# Patient Record
Sex: Female | Born: 1940
Health system: Southern US, Community
[De-identification: ages and names within clinical notes are randomized; demographics above are authoritative.]

## PROBLEM LIST (undated history)

## (undated) DIAGNOSIS — I10 Essential (primary) hypertension: Secondary | ICD-10-CM

## (undated) DIAGNOSIS — K5790 Diverticulosis of intestine, part unspecified, without perforation or abscess without bleeding: Secondary | ICD-10-CM

## (undated) DIAGNOSIS — E039 Hypothyroidism, unspecified: Secondary | ICD-10-CM

## (undated) DIAGNOSIS — E119 Type 2 diabetes mellitus without complications: Secondary | ICD-10-CM

## (undated) DIAGNOSIS — Z8669 Personal history of other diseases of the nervous system and sense organs: Secondary | ICD-10-CM

## (undated) DIAGNOSIS — E7800 Pure hypercholesterolemia, unspecified: Secondary | ICD-10-CM

## (undated) DIAGNOSIS — K449 Diaphragmatic hernia without obstruction or gangrene: Secondary | ICD-10-CM

## (undated) DIAGNOSIS — K219 Gastro-esophageal reflux disease without esophagitis: Secondary | ICD-10-CM

## (undated) DIAGNOSIS — R739 Hyperglycemia, unspecified: Secondary | ICD-10-CM

## (undated) HISTORY — DX: Diaphragmatic hernia without obstruction or gangrene: K44.9

## (undated) HISTORY — DX: Essential (primary) hypertension: I10

## (undated) HISTORY — DX: Hyperglycemia, unspecified: R73.9

## (undated) HISTORY — DX: Pure hypercholesterolemia, unspecified: E78.00

## (undated) HISTORY — DX: Hypothyroidism, unspecified: E03.9

## (undated) HISTORY — DX: Gastro-esophageal reflux disease without esophagitis: K21.9

## (undated) HISTORY — DX: Diverticulosis of intestine, part unspecified, without perforation or abscess without bleeding: K57.90

## (undated) HISTORY — DX: Personal history of other diseases of the nervous system and sense organs: Z86.69

---

## 1990-01-20 HISTORY — PX: THYROID LOBECTOMY: SHX420

## 1998-01-20 HISTORY — PX: BREAST EXCISIONAL BIOPSY: SUR124

## 2004-12-02 ENCOUNTER — Emergency Department: Payer: Self-pay | Admitting: Emergency Medicine

## 2005-10-09 ENCOUNTER — Ambulatory Visit: Payer: Self-pay | Admitting: Internal Medicine

## 2005-11-17 ENCOUNTER — Ambulatory Visit: Payer: Self-pay | Admitting: Gastroenterology

## 2006-05-20 ENCOUNTER — Ambulatory Visit: Payer: Self-pay | Admitting: Internal Medicine

## 2006-08-10 ENCOUNTER — Emergency Department: Payer: Self-pay | Admitting: Emergency Medicine

## 2006-11-11 ENCOUNTER — Ambulatory Visit: Payer: Self-pay | Admitting: Urology

## 2007-05-25 ENCOUNTER — Ambulatory Visit: Payer: Self-pay | Admitting: Internal Medicine

## 2008-05-09 ENCOUNTER — Ambulatory Visit: Payer: Self-pay | Admitting: Cardiology

## 2008-05-29 ENCOUNTER — Ambulatory Visit: Payer: Self-pay | Admitting: Internal Medicine

## 2008-05-31 ENCOUNTER — Ambulatory Visit: Payer: Self-pay | Admitting: Cardiology

## 2008-05-31 ENCOUNTER — Ambulatory Visit: Payer: Self-pay | Admitting: Gastroenterology

## 2008-06-09 ENCOUNTER — Telehealth: Payer: Self-pay | Admitting: Cardiology

## 2008-08-18 ENCOUNTER — Telehealth: Payer: Self-pay | Admitting: Cardiology

## 2008-11-16 DIAGNOSIS — R0602 Shortness of breath: Secondary | ICD-10-CM

## 2009-06-21 ENCOUNTER — Ambulatory Visit: Payer: Self-pay | Admitting: Internal Medicine

## 2010-02-04 ENCOUNTER — Ambulatory Visit: Payer: Self-pay | Admitting: Internal Medicine

## 2010-02-20 ENCOUNTER — Ambulatory Visit: Payer: Self-pay | Admitting: Internal Medicine

## 2010-03-21 ENCOUNTER — Ambulatory Visit: Payer: Self-pay | Admitting: Internal Medicine

## 2010-06-04 NOTE — Assessment & Plan Note (Signed)
[facility] OFFICE NOTE   NAME:Veronica Branch, Veronica Branch                    MRN:          [id number]  DATE:05/09/2008                            DOB:          26-Nov-1940    CHIEF COMPLAINT:  Shortness of breath.   Veronica Branch is a 70 year old married white female, wife of a patient  of mine, who comes today referred by Dr. Dale [place] for increasing  shortness of breath and dyspnea on exertion.   She says that it has got particularly bad over the past year.  She rides  a stationary bike and enjoys walking.  She has really been limited.   She thinks its just her weight and deconditioning.  She has gained 15  pounds over the past year.   She has cardiac risk factors including age, family history as well as  hyperlipidemia.  She is on a statin.  She is also overweight.   Her symptoms do not include any symptoms of angina per se.  She has had  no palpitations.  No syncope.   PAST MEDICAL/SURGICAL HISTORY:  She has had removal of right thyroid  lobe and isthmus in 1992.  She has a history of a hiatal hernia with  reflux.  She has migraine headaches, on propranolol; hypothyroidism, on  replacement therapy; and a history of lactose intolerance.   CURRENT MEDICATIONS:  1. Tylenol p.r.n.  2. Synthroid 100 mcg per day.  3. Propranolol 40 mg per day.  4. Omeprazole 20 mg per day.  5. Pravastatin 20 mg a day.   ALLERGIES:  1. PENICILLIN gives her hives.  2. SULFA gives her rash.  3. CIPRO causes her to have arm pain and tendinitis.   FAMILY HISTORY:  Her mother had a heart attack in her 16s.  She has one  brother, who is living in good health.   SOCIAL HISTORY:  She is married, has 2 children.  She does not drink or  smoke.  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   REVIEW OF SYSTEMS:  Other than the history of present illness, she  really is negative for all systems questioned.  Please refer to our  patient  information form.   PHYSICAL EXAMINATION:  GENERAL:  She is a very pleasant, overweight,  white female in no acute distress.  VITAL SIGNS:  Her blood pressure is 140/84, pulse 64 and regular, her  weight is 192.  She is 5 feet tall.  HEENT:  Normal.  Teeth are in good shape.  Oral mucosa normal.  NECK:  Supple.  Carotid upstrokes were equal bilaterally without bruits.  Thyroid is not enlarged.  Trachea is midline.  LUNGS:  Clear to auscultation and percussion.  HEART:  A poorly appreciated PMI.  Normal S1 and S2.  No murmur, rub, or  gallop.  ABDOMEN:  Obese with good bowel sounds.  There is no tenderness.  There  is no obvious pulsatile mass or midline bruit.  No obvious organomegaly.  EXTREMITIES:  No cyanosis, clubbing, or edema.  Pulses were 2+/4+  dorsalis pedis and 1+/4+ posterior tibial.  There is no sign of DVT.  She has no major varicosities.  NEUROLOGIC:  Intact.  MUSCULOSKELETAL:  Chronic arthritic changes.  Her gait is normal.   Electrocardiogram from Dr. Roby Lofts office shows sinus rhythm with some T-  wave changes across the anterior precordium with some poor R-wave  progression.   ASSESSMENT:  Slow increase in dyspnea on exertion and shortness of  breath.  This could be an ischemic equivalent and obstructive coronary  artery disease needs to be ruled out with her risk factors.  She also  has some mild abnormality on her EKG.   PLAN:  Exercise rest stress Myoview off of propranolol.  We will assess  the anterior wall, EF, not to mention ischemia.   If this is negative, we will attribute this to deconditioning and to her  weight.   I have encouraged her to exercise at least 3 hours per week.  I have  also told her that she will not lose weight by exercising unless she  would exercise 10 hours per week and not change her diet!   She is a delightful lady and hopefully her scan will be negative.  Hopefully she can discipline herself to get her 15 pounds back off and   maintain her weight at a lower level.     Thomas C. Daleen Squibb, MD, [facility]  Electronically Signed    TCW/MedQ  DD: 05/09/2008  DT: 05/10/2008  Job #: [id number]   cc:   Dale [place]

## 2010-07-04 ENCOUNTER — Encounter: Payer: Self-pay | Admitting: Cardiology

## 2010-08-01 ENCOUNTER — Ambulatory Visit: Payer: Self-pay | Admitting: Internal Medicine

## 2011-08-05 ENCOUNTER — Ambulatory Visit: Payer: Self-pay | Admitting: Internal Medicine

## 2011-08-07 ENCOUNTER — Ambulatory Visit: Payer: Self-pay | Admitting: Internal Medicine

## 2011-11-25 ENCOUNTER — Encounter: Payer: Self-pay | Admitting: Internal Medicine

## 2011-11-25 ENCOUNTER — Ambulatory Visit (INDEPENDENT_AMBULATORY_CARE_PROVIDER_SITE_OTHER): Payer: Medicare Other | Admitting: Internal Medicine

## 2011-11-25 VITALS — BP 128/82 | HR 60 | Temp 97.4°F | Ht 64.0 in | Wt 195.0 lb

## 2011-11-25 DIAGNOSIS — R7309 Other abnormal glucose: Secondary | ICD-10-CM

## 2011-11-25 DIAGNOSIS — E7800 Pure hypercholesterolemia, unspecified: Secondary | ICD-10-CM | POA: Insufficient documentation

## 2011-11-25 DIAGNOSIS — R5381 Other malaise: Secondary | ICD-10-CM

## 2011-11-25 DIAGNOSIS — R5383 Other fatigue: Secondary | ICD-10-CM

## 2011-11-25 DIAGNOSIS — I10 Essential (primary) hypertension: Secondary | ICD-10-CM | POA: Insufficient documentation

## 2011-11-25 DIAGNOSIS — Z8669 Personal history of other diseases of the nervous system and sense organs: Secondary | ICD-10-CM

## 2011-11-25 DIAGNOSIS — M949 Disorder of cartilage, unspecified: Secondary | ICD-10-CM

## 2011-11-25 DIAGNOSIS — E119 Type 2 diabetes mellitus without complications: Secondary | ICD-10-CM | POA: Insufficient documentation

## 2011-11-25 DIAGNOSIS — E039 Hypothyroidism, unspecified: Secondary | ICD-10-CM

## 2011-11-25 DIAGNOSIS — R739 Hyperglycemia, unspecified: Secondary | ICD-10-CM

## 2011-11-25 DIAGNOSIS — M79606 Pain in leg, unspecified: Secondary | ICD-10-CM

## 2011-11-25 DIAGNOSIS — Z23 Encounter for immunization: Secondary | ICD-10-CM

## 2011-11-25 DIAGNOSIS — M79609 Pain in unspecified limb: Secondary | ICD-10-CM

## 2011-11-25 DIAGNOSIS — M8580 Other specified disorders of bone density and structure, unspecified site: Secondary | ICD-10-CM

## 2011-11-25 DIAGNOSIS — Z0000 Encounter for general adult medical examination without abnormal findings: Secondary | ICD-10-CM

## 2011-11-25 NOTE — Assessment & Plan Note (Signed)
She declines Evista, bisphosphonates and nasal spray - as treatment for her bones.  Continue calcium and vitamin D.      

## 2011-11-25 NOTE — Patient Instructions (Signed)
It was nice seeing you today.  We will get your labs scheduled and will schedule an ultrasound of your left leg to confirm no clot.  Let me know if problems.

## 2011-11-25 NOTE — Progress Notes (Signed)
  Subjective:    Patient ID: Veronica Branch, female    DOB: 05-01-1940, 71 y.o.   MRN: [id number]  HPI 71 year old female with past history of hypertension, hyperglycemia, hypercholesterolemia and hypothyroidism who comes in today for a scheduled follow up.  States she has been doing relatively well.  Her main complaint is that of left leg discomfort and some swelling - pain and swelling localized just below the knee (laterally).  No known injury or trauma.  Present for one month.  Bothers her more when she climbs stairs or bends.  Can walk on level ground better.  She reports sugars in the am averaging around 120.  Does not take in the evening.    Past Medical History  Diagnosis Date  . History of migraine headaches   . Hypothyroidism     s/p removal of right thyroid lobe and isthmus (1992)  . GERD (gastroesophageal reflux disease)   . Hiatal hernia   . Hypertension   . Hypercholesterolemia   . Hyperglycemia   . Diverticulosis     Review of Systems Patient denies any headache, lightheadedness or dizziness.  No sinus or allergy symptoms.  No chest pain, tightness or palpitations.  No increased shortness of breath, cough or congestion.  No acid reflux, dysphagia or odynophagia reported.   No nausea or vomiting.  No abdominal pain or cramping.  No bowel change, such as diarrhea, constipation, BRBPR or melana.  No urine change.        Objective:   Physical Exam Filed Vitals:   11/25/11 0946  BP: 128/82  Pulse: 60  Temp: 97.4 F (43.58 C)   70 year old female in no acute distress.   HEENT:  Nares - clear.  OP- without lesions or erythema.  NECK:  Supple, nontender.  No audible bruit.   HEART:  Appears to be regular. LUNGS:  Without crackles or wheezing audible.  Respirations even and unlabored.   RADIAL PULSE:  Equal bilaterally.  ABDOMEN:  Soft, nontender.  No audible abdominal bruit.   EXTREMITIES:  No increased edema to be present.  Mild increased tenderness just below the knee  - laterally.  Minimal soft tissue fullness in this area.  No increased erythema or warmth.  DP pulses palpable and equal bilaterally.  (2 plus).                 Assessment & Plan:  HEALTH MAINTENANCE.  Physical 10/09/10.  Mammogram 08/01/10 - BiRADS I.  Colonoscopy 12/02 revealed diverticulosis and hemorrhoids.  Due follow up colonoscopy.

## 2011-11-25 NOTE — Assessment & Plan Note (Addendum)
Continue Propranolol.  Appears to be stable.   

## 2011-11-26 ENCOUNTER — Other Ambulatory Visit (INDEPENDENT_AMBULATORY_CARE_PROVIDER_SITE_OTHER): Payer: Medicare Other

## 2011-11-26 ENCOUNTER — Ambulatory Visit: Payer: Self-pay | Admitting: Internal Medicine

## 2011-11-26 DIAGNOSIS — R7309 Other abnormal glucose: Secondary | ICD-10-CM

## 2011-11-26 DIAGNOSIS — R5383 Other fatigue: Secondary | ICD-10-CM

## 2011-11-26 DIAGNOSIS — I10 Essential (primary) hypertension: Secondary | ICD-10-CM

## 2011-11-26 DIAGNOSIS — R5381 Other malaise: Secondary | ICD-10-CM

## 2011-11-26 DIAGNOSIS — E039 Hypothyroidism, unspecified: Secondary | ICD-10-CM

## 2011-11-26 DIAGNOSIS — E7800 Pure hypercholesterolemia, unspecified: Secondary | ICD-10-CM

## 2011-11-26 DIAGNOSIS — R739 Hyperglycemia, unspecified: Secondary | ICD-10-CM

## 2011-11-26 LAB — HEPATIC FUNCTION PANEL
ALT: 23 U/L (ref 0–35)
AST: 15 U/L (ref 0–37)
Alkaline Phosphatase: 67 U/L (ref 39–117)
Total Bilirubin: 0.7 mg/dL (ref 0.3–1.2)

## 2011-11-26 LAB — CBC WITH DIFFERENTIAL/PLATELET
Basophils Absolute: 0.1 10*3/uL (ref 0.0–0.1)
Eosinophils Absolute: 0.1 10*3/uL (ref 0.0–0.7)
Eosinophils Relative: 1.3 % (ref 0.0–5.0)
MCHC: 32.7 g/dL (ref 30.0–36.0)
MCV: 93.5 fl (ref 78.0–100.0)
Monocytes Absolute: 0.5 10*3/uL (ref 0.1–1.0)
Neutrophils Relative %: 70.7 % (ref 43.0–77.0)
Platelets: 248 10*3/uL (ref 150.0–400.0)
RDW: 13 % (ref 11.5–14.6)
WBC: 6.8 10*3/uL (ref 4.5–10.5)

## 2011-11-26 LAB — LIPID PANEL
Cholesterol: 170 mg/dL (ref 0–200)
VLDL: 21 mg/dL (ref 0.0–40.0)

## 2011-11-26 LAB — BASIC METABOLIC PANEL
BUN: 12 mg/dL (ref 6–23)
GFR: 81.18 mL/min (ref >60.00)
Potassium: 4.1 mEq/L (ref 3.5–5.1)
Sodium: 140 mEq/L (ref 135–145)

## 2011-11-26 LAB — HEMOGLOBIN A1C: Hgb A1c MFr Bld: 6.6 % — ABNORMAL HIGH (ref 4.6–6.5)

## 2011-11-27 ENCOUNTER — Encounter: Payer: Self-pay | Admitting: Internal Medicine

## 2011-11-27 ENCOUNTER — Telehealth: Payer: Self-pay | Admitting: Internal Medicine

## 2011-11-27 NOTE — Assessment & Plan Note (Signed)
Low cholesterol diet and exercise. Check lipid panel with next labs.  Follow liver function.  Same medication.    

## 2011-11-27 NOTE — Assessment & Plan Note (Signed)
Blood pressure under good control.  Same meds.  Check met b.  Follow.    

## 2011-11-27 NOTE — Telephone Encounter (Signed)
appt 2/20 pt aware of appointment

## 2011-11-27 NOTE — Telephone Encounter (Signed)
Left message for pt to call office Please schedule cpx for feb

## 2011-11-27 NOTE — Assessment & Plan Note (Signed)
Continue current med regimen.  Check tsh with next labs.

## 2011-11-27 NOTE — Telephone Encounter (Signed)
Pt was seen this week.  She was supposed to have been scheduled for a physical in 2/14.  Pt states was told did not need a follow up appt.  Please schedule her for a physical in 2/14.  Thanks.

## 2011-11-27 NOTE — Assessment & Plan Note (Signed)
Low carb diet and exercise.  Weight loss.  Check met b and a1c with next labs.     

## 2011-11-28 ENCOUNTER — Telehealth: Payer: Self-pay | Admitting: Internal Medicine

## 2011-11-28 NOTE — Telephone Encounter (Signed)
Called patient and gave results.

## 2011-11-28 NOTE — Telephone Encounter (Signed)
Notify pt that overall sugar control ok - just slightly increased above normal.  Low carb diet and exercise.  Will follow.  Cholesterol and other labs ok.

## 2011-12-05 ENCOUNTER — Encounter: Payer: Self-pay | Admitting: Internal Medicine

## 2011-12-21 ENCOUNTER — Telehealth: Payer: Self-pay | Admitting: Internal Medicine

## 2011-12-21 NOTE — Telephone Encounter (Signed)
Unable to e prescribe rx to [facility] [address].  Faxed rx for omeprazole 20mg  q day #30 with 5 refills.

## 2011-12-31 ENCOUNTER — Telehealth: Payer: Self-pay | Admitting: Internal Medicine

## 2011-12-31 NOTE — Telephone Encounter (Signed)
Refill on pravastatin 20 mg 30 day supply sent to [facility] .

## 2012-01-01 ENCOUNTER — Other Ambulatory Visit: Payer: Self-pay | Admitting: *Deleted

## 2012-01-01 MED ORDER — PRAVASTATIN SODIUM 20 MG PO TABS: 20.0000 mg | ORAL_TABLET | Freq: Every day | ORAL | Status: DC

## 2012-01-01 NOTE — Telephone Encounter (Signed)
Filled script pravastastin

## 2012-02-23 ENCOUNTER — Other Ambulatory Visit: Payer: Self-pay | Admitting: *Deleted

## 2012-02-24 MED ORDER — LISINOPRIL 10 MG PO TABS: 10.0000 mg | ORAL_TABLET | Freq: Every day | ORAL | Status: DC

## 2012-03-11 ENCOUNTER — Encounter: Payer: Self-pay | Admitting: Internal Medicine

## 2012-03-11 ENCOUNTER — Telehealth: Payer: Self-pay | Admitting: *Deleted

## 2012-03-11 ENCOUNTER — Other Ambulatory Visit (HOSPITAL_COMMUNITY)
Admission: RE | Admit: 2012-03-11 | Discharge: 2012-03-11 | Disposition: A | Discharge status: Home / Self Care | Payer: Medicare Other | Source: Ambulatory Visit | Attending: Internal Medicine | Admitting: Internal Medicine

## 2012-03-11 ENCOUNTER — Ambulatory Visit (INDEPENDENT_AMBULATORY_CARE_PROVIDER_SITE_OTHER): Payer: Medicare Other | Admitting: Internal Medicine

## 2012-03-11 VITALS — BP 108/70 | HR 71 | Temp 98.5°F | Ht 64.0 in | Wt 196.5 lb

## 2012-03-11 DIAGNOSIS — I10 Essential (primary) hypertension: Secondary | ICD-10-CM

## 2012-03-11 DIAGNOSIS — R7309 Other abnormal glucose: Secondary | ICD-10-CM

## 2012-03-11 DIAGNOSIS — Z1239 Encounter for other screening for malignant neoplasm of breast: Secondary | ICD-10-CM

## 2012-03-11 DIAGNOSIS — L989 Disorder of the skin and subcutaneous tissue, unspecified: Secondary | ICD-10-CM

## 2012-03-11 DIAGNOSIS — M899 Disorder of bone, unspecified: Secondary | ICD-10-CM

## 2012-03-11 DIAGNOSIS — R739 Hyperglycemia, unspecified: Secondary | ICD-10-CM

## 2012-03-11 DIAGNOSIS — E039 Hypothyroidism, unspecified: Secondary | ICD-10-CM

## 2012-03-11 DIAGNOSIS — Z1211 Encounter for screening for malignant neoplasm of colon: Secondary | ICD-10-CM

## 2012-03-11 DIAGNOSIS — E7800 Pure hypercholesterolemia, unspecified: Secondary | ICD-10-CM

## 2012-03-11 DIAGNOSIS — Z8669 Personal history of other diseases of the nervous system and sense organs: Secondary | ICD-10-CM

## 2012-03-11 DIAGNOSIS — Z124 Encounter for screening for malignant neoplasm of cervix: Secondary | ICD-10-CM | POA: Insufficient documentation

## 2012-03-11 DIAGNOSIS — M8580 Other specified disorders of bone density and structure, unspecified site: Secondary | ICD-10-CM

## 2012-03-16 ENCOUNTER — Encounter: Payer: Self-pay | Admitting: *Deleted

## 2012-03-21 ENCOUNTER — Encounter: Payer: Self-pay | Admitting: Internal Medicine

## 2012-03-21 NOTE — Assessment & Plan Note (Signed)
Continue current med regimen.  Follow tsh.    

## 2012-03-21 NOTE — Progress Notes (Signed)
  Subjective:    Patient ID: Veronica Branch, female    DOB: 1940-04-20, 72 y.o.   MRN: [id number]  HPI 72 year old female with past history of migraine headaches, GERD, hypertension, hypercholesterolemia, hyperglycemia and hypothyroidism.  She comes in today to follow up on these issues as well as for a complete physical exam.  She states she is doing relatively well.  Breathing stable.  No chest pain or tightness.  Acid reflux controlled.  Bowels stable.  No change in headaches.     Past Medical History  Diagnosis Date  . History of migraine headaches   . Hypothyroidism     s/p removal of right thyroid lobe and isthmus (1992)  . GERD (gastroesophageal reflux disease)   . Hiatal hernia   . Hypertension   . Hypercholesterolemia   . Hyperglycemia   . Diverticulosis     Outpatient Encounter Prescriptions as of 03/11/2012  Medication Sig Dispense Refill  . levothyroxine (SYNTHROID) 100 MCG tablet Take 100 mcg by mouth daily.      Marland Kitchen lisinopril (PRINIVIL,ZESTRIL) 10 MG tablet Take 1 tablet (10 mg total) by mouth daily.  30 tablet  5  . omeprazole (PRILOSEC) 20 MG capsule Take 20 mg by mouth daily.      . pravastatin (PRAVACHOL) 20 MG tablet Take 1 tablet (20 mg total) by mouth daily.  90 tablet  3  . propranolol (INDERAL) 40 MG tablet Take 40 mg by mouth 2 (two) times daily.       No facility-administered encounter medications on file as of 03/11/2012.    Review of Systems Patient denies any lightheadedness or dizziness.  Headaches stable.  No increased sinus or allergy problems.  No chest pain, tightness or palpitations.  No increased shortness of breath, cough or congestion.  No nausea or vomiting.  Acid reflux controlled.  No abdominal pain or cramping.  No bowel change, such as diarrhea, constipation, BRBPR or melana.  No urine change.        Objective:   Physical Exam Filed Vitals:   03/11/12 1346  BP: 108/70  Pulse: 71  Temp: 98.5 F (36.9 C)   Blood pressure recheck:   72/78  72 year old female in no acute distress.   HEENT:  Nares- clear.  Oropharynx - without lesions. NECK:  Supple.  Nontender.  No audible bruit.  HEART:  Appears to be regular. LUNGS:  No crackles or wheezing audible.  Respirations even and unlabored.  RADIAL PULSE:  Equal bilaterally.    BREASTS:  No nipple discharge or nipple retraction present.  Could not appreciate any distinct nodules or axillary adenopathy.  ABDOMEN:  Soft, nontender.  Bowel sounds present and normal.  No audible abdominal bruit.  GU:  Normal external genitalia.  Vaginal vault without lesions.  Cervix identified.  Pap performed. Could not appreciate any adnexal masses or tenderness.   RECTAL:  Heme negative.   EXTREMITIES:  No increased edema present.  DP pulses palpable and equal bilaterally.           Assessment & Plan:  DERMATOLOGY.  Persistent skin lesion beneath the left breast.  Refer to dermatology.   HEALTH MAINTENANCE.  Physical today.  Overdue colonoscopy.  Refer to GI.  Schedule mammogram.

## 2012-03-21 NOTE — Assessment & Plan Note (Signed)
Low cholesterol diet and exercise. Check lipid panel with next labs.  Follow liver function.  Same medication.

## 2012-03-21 NOTE — Assessment & Plan Note (Signed)
She declines Evista, bisphosphonates and nasal spray - as treatment for her bones.  Continue calcium and vitamin D.

## 2012-03-21 NOTE — Assessment & Plan Note (Signed)
Blood pressure under good control.  Same meds.  Check met b.  Follow.

## 2012-03-21 NOTE — Assessment & Plan Note (Signed)
Continue Propranolol.  Appears to be stable.   

## 2012-03-21 NOTE — Assessment & Plan Note (Signed)
Low carb diet and exercise.  Weight loss.  Check met b and a1c with next labs.

## 2012-03-23 ENCOUNTER — Telehealth: Payer: Self-pay | Admitting: *Deleted

## 2012-03-23 NOTE — Telephone Encounter (Signed)
Patient returning your call concerning msg left on her answering machine that she could not understand

## 2012-03-24 NOTE — Telephone Encounter (Signed)
Returned patient call.

## 2012-03-30 ENCOUNTER — Other Ambulatory Visit (INDEPENDENT_AMBULATORY_CARE_PROVIDER_SITE_OTHER): Payer: Medicare Other

## 2012-03-30 DIAGNOSIS — Z1211 Encounter for screening for malignant neoplasm of colon: Secondary | ICD-10-CM

## 2012-03-30 LAB — FECAL OCCULT BLOOD, IMMUNOCHEMICAL: Fecal Occult Bld: NEGATIVE

## 2012-03-31 ENCOUNTER — Encounter: Payer: Self-pay | Admitting: Internal Medicine

## 2012-04-29 NOTE — Telephone Encounter (Signed)
error 

## 2012-05-17 ENCOUNTER — Telehealth: Payer: Self-pay | Admitting: Internal Medicine

## 2012-05-17 NOTE — Telephone Encounter (Signed)
propranolol (INDERAL) 40 MG tablet #90

## 2012-05-18 MED ORDER — PROPRANOLOL HCL 40 MG PO TABS: 40.0000 mg | ORAL_TABLET | Freq: Two times a day (BID) | ORAL | Status: DC

## 2012-05-18 NOTE — Telephone Encounter (Signed)
Spoke with pt & confirmed that she want a 90 day supply at a [facility]. Sent in 180 tablets, with 1 refill electronically

## 2012-05-18 NOTE — Telephone Encounter (Signed)
Pt called & requested a 90 tablets of the Propanolol. I wanted to confirm that she is still taking it twice a day. If so, she will need more than 90 tablets.

## 2012-06-25 ENCOUNTER — Telehealth: Payer: Self-pay | Admitting: Internal Medicine

## 2012-06-25 NOTE — Telephone Encounter (Signed)
Caller: English/Patient; Phone: [phone]; Reason for Call: Pt was returning phone call to [facility], pt unsure what call was about.  Reviewed EPIC, no messages left.  Please return pts call.

## 2012-07-15 ENCOUNTER — Other Ambulatory Visit: Payer: Self-pay | Admitting: *Deleted

## 2012-07-15 MED ORDER — OMEPRAZOLE 20 MG PO CPDR: 20.0000 mg | DELAYED_RELEASE_CAPSULE | Freq: Every day | ORAL | Status: DC

## 2012-08-05 ENCOUNTER — Other Ambulatory Visit (INDEPENDENT_AMBULATORY_CARE_PROVIDER_SITE_OTHER): Payer: Medicare Other

## 2012-08-05 ENCOUNTER — Ambulatory Visit: Payer: Self-pay | Admitting: Internal Medicine

## 2012-08-05 DIAGNOSIS — E7800 Pure hypercholesterolemia, unspecified: Secondary | ICD-10-CM

## 2012-08-05 DIAGNOSIS — R7309 Other abnormal glucose: Secondary | ICD-10-CM

## 2012-08-05 DIAGNOSIS — I10 Essential (primary) hypertension: Secondary | ICD-10-CM

## 2012-08-05 DIAGNOSIS — R739 Hyperglycemia, unspecified: Secondary | ICD-10-CM

## 2012-08-05 LAB — BASIC METABOLIC PANEL
BUN: 11 mg/dL (ref 6–23)
CO2: 27 mEq/L (ref 19–32)
Calcium: 9.2 mg/dL (ref 8.4–10.5)
Creatinine, Ser: 0.8 mg/dL (ref 0.4–1.2)
Glucose, Bld: 107 mg/dL — ABNORMAL HIGH (ref 70–99)

## 2012-08-05 LAB — HEPATIC FUNCTION PANEL
AST: 26 U/L (ref 0–37)
Alkaline Phosphatase: 74 U/L (ref 39–117)
Bilirubin, Direct: 0.1 mg/dL (ref 0.0–0.3)
Total Bilirubin: 0.5 mg/dL (ref 0.3–1.2)

## 2012-08-05 LAB — LIPID PANEL: Total CHOL/HDL Ratio: 4

## 2012-08-06 ENCOUNTER — Other Ambulatory Visit: Payer: Medicare Other

## 2012-08-09 ENCOUNTER — Ambulatory Visit (INDEPENDENT_AMBULATORY_CARE_PROVIDER_SITE_OTHER): Payer: Medicare Other | Admitting: Internal Medicine

## 2012-08-09 ENCOUNTER — Encounter: Payer: Self-pay | Admitting: Internal Medicine

## 2012-08-09 VITALS — BP 110/80 | HR 62 | Temp 98.0°F | Ht 64.0 in | Wt 198.2 lb

## 2012-08-09 DIAGNOSIS — Z8669 Personal history of other diseases of the nervous system and sense organs: Secondary | ICD-10-CM

## 2012-08-09 DIAGNOSIS — E7800 Pure hypercholesterolemia, unspecified: Secondary | ICD-10-CM

## 2012-08-09 DIAGNOSIS — M899 Disorder of bone, unspecified: Secondary | ICD-10-CM

## 2012-08-09 DIAGNOSIS — E119 Type 2 diabetes mellitus without complications: Secondary | ICD-10-CM

## 2012-08-09 DIAGNOSIS — I10 Essential (primary) hypertension: Secondary | ICD-10-CM

## 2012-08-09 DIAGNOSIS — M8580 Other specified disorders of bone density and structure, unspecified site: Secondary | ICD-10-CM

## 2012-08-09 DIAGNOSIS — E039 Hypothyroidism, unspecified: Secondary | ICD-10-CM

## 2012-08-09 MED ORDER — LEVOTHYROXINE SODIUM 100 MCG PO TABS: 100.0000 ug | ORAL_TABLET | Freq: Every day | ORAL | Status: DC

## 2012-08-09 NOTE — Progress Notes (Signed)
  Subjective:    Patient ID: Veronica Branch, female    DOB: 1940/09/14, 72 y.o.   MRN: [id number]  HPI 72 year old female with past history of migraine headaches, GERD, hypertension, hypercholesterolemia, hyperglycemia and hypothyroidism.  She comes in today for a scheduled follow up.  She states she is doing relatively well.  Breathing stable.  No chest pain or tightness.  Acid reflux controlled.  Bowels stable.  Recent labs revealed an a1c 7.0.  We discussed this at length today.  Discussed the need for diet, exercise and weight loss.  Discussed with her my desire to start medication.  She declines.  Wants to work on diet and exercise.       Past Medical History  Diagnosis Date  . History of migraine headaches   . Hypothyroidism     s/p removal of right thyroid lobe and isthmus (1992)  . GERD (gastroesophageal reflux disease)   . Hiatal hernia   . Hypertension   . Hypercholesterolemia   . Hyperglycemia   . Diverticulosis     Outpatient Encounter Prescriptions as of 08/09/2012  Medication Sig Dispense Refill  . levothyroxine (SYNTHROID) 100 MCG tablet Take 100 mcg by mouth daily.      Marland Kitchen lisinopril (PRINIVIL,ZESTRIL) 10 MG tablet Take 1 tablet (10 mg total) by mouth daily.  30 tablet  5  . omeprazole (PRILOSEC) 20 MG capsule Take 1 capsule (20 mg total) by mouth daily.  30 capsule  5  . pravastatin (PRAVACHOL) 20 MG tablet Take 1 tablet (20 mg total) by mouth daily.  90 tablet  3  . propranolol (INDERAL) 40 MG tablet Take 1 tablet (40 mg total) by mouth 2 (two) times daily.  180 tablet  1   No facility-administered encounter medications on file as of 08/09/2012.    Review of Systems Patient denies any lightheadedness or dizziness.  No headache problems reported today.  No increased sinus or allergy problems.  No chest pain, tightness or palpitations.  No increased shortness of breath, cough or congestion.  No nausea or vomiting.  Acid reflux controlled.  No abdominal pain or cramping.   No bowel change, such as diarrhea, constipation, BRBPR or melana.  No urine change.   A1c increasing as outlined.       Objective:   Physical Exam  Filed Vitals:   08/09/12 1343  BP: 110/80  Pulse: 62  Temp: 98 F (8.87 C)   72 year old female in no acute distress.   HEENT:  Nares- clear.  Oropharynx - without lesions. NECK:  Supple.  Nontender.  No audible bruit.  HEART:  Appears to be regular. LUNGS:  No crackles or wheezing audible.  Respirations even and unlabored.  RADIAL PULSE:  Equal bilaterally. ABDOMEN:  Soft, nontender.  Bowel sounds present and normal.  No audible abdominal bruit.   EXTREMITIES:  No increased edema present.  DP pulses palpable and equal bilaterally.           Assessment & Plan:  HEALTH MAINTENANCE.  Physical last visit.  Overdue colonoscopy.  Was referred to GI.  Obtain records.  Scheduled to have her mammogram in the near future.

## 2012-08-10 ENCOUNTER — Encounter: Payer: Self-pay | Admitting: Internal Medicine

## 2012-08-10 NOTE — Assessment & Plan Note (Signed)
Blood pressure under good control.  Same meds.  Follow met b.  

## 2012-08-10 NOTE — Assessment & Plan Note (Signed)
Continue Propranolol.  Appears to be stable.   

## 2012-08-10 NOTE — Assessment & Plan Note (Signed)
She declines Evista, bisphosphonates and nasal spray - as treatment for her bones.  Continue calcium and vitamin D.

## 2012-08-10 NOTE — Assessment & Plan Note (Signed)
A1c just checked 08/05/12 - 7.0.  Discussed the importance of diet, exercise and weight loss.  Discussed my desire to start medication.  She declines.  Wants to work on diet and exercise.  Will follow.  Check and record sugars bid and send in over the next few weeks.

## 2012-08-10 NOTE — Assessment & Plan Note (Signed)
Continue current med regimen.  Follow tsh.    

## 2012-08-10 NOTE — Assessment & Plan Note (Addendum)
Low cholesterol diet and exercise.  Follow lipid panel and liver function.  Lipid panel just checked 08/05/12 revealed total cholesterol 174, triglycerides 104, HDL 46 and LDL 107.  She wants to work on diet and exercise and stop her cholesterol medication and see if she can control her cholesterol without medication.  Will follow.

## 2012-08-16 ENCOUNTER — Other Ambulatory Visit: Payer: Self-pay | Admitting: *Deleted

## 2012-08-16 MED ORDER — LISINOPRIL 10 MG PO TABS: 10.0000 mg | ORAL_TABLET | Freq: Every day | ORAL | Status: DC

## 2012-08-20 ENCOUNTER — Telehealth: Payer: Self-pay | Admitting: *Deleted

## 2012-08-20 NOTE — Telephone Encounter (Signed)
Left message for pt to call back with most recent eye exam and pneumovax

## 2012-08-25 ENCOUNTER — Ambulatory Visit: Payer: Self-pay | Admitting: Gastroenterology

## 2012-08-25 LAB — HM COLONOSCOPY

## 2012-08-25 NOTE — Telephone Encounter (Signed)
Left message for pt to return my call.

## 2012-08-26 ENCOUNTER — Encounter: Payer: Self-pay | Admitting: *Deleted

## 2012-08-26 ENCOUNTER — Encounter: Payer: Self-pay | Admitting: Internal Medicine

## 2012-08-26 NOTE — Telephone Encounter (Signed)
Letter mailed to pt requesting chart review information.

## 2012-09-01 ENCOUNTER — Encounter: Payer: Self-pay | Admitting: *Deleted

## 2012-09-01 NOTE — Telephone Encounter (Signed)
Pt called to report eye exam 09/01/12, chart updated.

## 2012-11-14 ENCOUNTER — Other Ambulatory Visit: Payer: Self-pay | Admitting: Internal Medicine

## 2012-11-25 ENCOUNTER — Other Ambulatory Visit: Payer: Self-pay | Admitting: Internal Medicine

## 2012-12-01 ENCOUNTER — Encounter (INDEPENDENT_AMBULATORY_CARE_PROVIDER_SITE_OTHER): Payer: Self-pay

## 2012-12-01 ENCOUNTER — Ambulatory Visit (INDEPENDENT_AMBULATORY_CARE_PROVIDER_SITE_OTHER): Payer: Medicare Other

## 2012-12-01 DIAGNOSIS — Z23 Encounter for immunization: Secondary | ICD-10-CM

## 2012-12-06 ENCOUNTER — Other Ambulatory Visit (INDEPENDENT_AMBULATORY_CARE_PROVIDER_SITE_OTHER): Payer: Medicare Other

## 2012-12-06 DIAGNOSIS — E7800 Pure hypercholesterolemia, unspecified: Secondary | ICD-10-CM

## 2012-12-06 DIAGNOSIS — E039 Hypothyroidism, unspecified: Secondary | ICD-10-CM

## 2012-12-06 DIAGNOSIS — E119 Type 2 diabetes mellitus without complications: Secondary | ICD-10-CM

## 2012-12-06 LAB — BASIC METABOLIC PANEL
BUN: 11 mg/dL (ref 6–23)
CO2: 27 mEq/L (ref 19–32)
Calcium: 9.5 mg/dL (ref 8.4–10.5)
Creatinine, Ser: 0.7 mg/dL (ref 0.4–1.2)
Glucose, Bld: 113 mg/dL — ABNORMAL HIGH (ref 70–99)

## 2012-12-06 LAB — HEPATIC FUNCTION PANEL
ALT: 43 U/L — ABNORMAL HIGH (ref 0–35)
AST: 30 U/L (ref 0–37)
Bilirubin, Direct: 0.1 mg/dL (ref 0.0–0.3)
Total Bilirubin: 0.7 mg/dL (ref 0.3–1.2)

## 2012-12-06 LAB — LIPID PANEL: Triglycerides: 154 mg/dL — ABNORMAL HIGH (ref 0.0–149.0)

## 2012-12-06 LAB — MICROALBUMIN / CREATININE URINE RATIO: Microalb, Ur: 1.5 mg/dL (ref 0.0–1.9)

## 2012-12-07 LAB — LDL CHOLESTEROL, DIRECT: Direct LDL: 156 mg/dL

## 2012-12-09 ENCOUNTER — Encounter: Payer: Self-pay | Admitting: Internal Medicine

## 2012-12-09 ENCOUNTER — Ambulatory Visit (INDEPENDENT_AMBULATORY_CARE_PROVIDER_SITE_OTHER): Payer: Medicare Other | Admitting: Internal Medicine

## 2012-12-09 VITALS — BP 120/70 | HR 64 | Temp 98.2°F | Ht 64.0 in | Wt 194.5 lb

## 2012-12-09 DIAGNOSIS — E7800 Pure hypercholesterolemia, unspecified: Secondary | ICD-10-CM

## 2012-12-09 DIAGNOSIS — I10 Essential (primary) hypertension: Secondary | ICD-10-CM

## 2012-12-09 DIAGNOSIS — M899 Disorder of bone, unspecified: Secondary | ICD-10-CM

## 2012-12-09 DIAGNOSIS — E039 Hypothyroidism, unspecified: Secondary | ICD-10-CM

## 2012-12-09 DIAGNOSIS — M8580 Other specified disorders of bone density and structure, unspecified site: Secondary | ICD-10-CM

## 2012-12-09 DIAGNOSIS — E119 Type 2 diabetes mellitus without complications: Secondary | ICD-10-CM

## 2012-12-09 DIAGNOSIS — Z8669 Personal history of other diseases of the nervous system and sense organs: Secondary | ICD-10-CM

## 2012-12-09 DIAGNOSIS — Z23 Encounter for immunization: Secondary | ICD-10-CM

## 2012-12-09 DIAGNOSIS — R945 Abnormal results of liver function studies: Secondary | ICD-10-CM

## 2012-12-09 DIAGNOSIS — R7989 Other specified abnormal findings of blood chemistry: Secondary | ICD-10-CM

## 2012-12-09 LAB — HM DIABETES FOOT EXAM

## 2012-12-09 NOTE — Progress Notes (Signed)
  Subjective:    Patient ID: Veronica Branch, female    DOB: 17-Nov-1940, 72 y.o.   MRN: [id number]  HPI 72 year old female with past history of migraine headaches, GERD, hypertension, hypercholesterolemia, hyperglycemia and hypothyroidism.  She comes in today for a scheduled follow up.  She states she is doing relatively well.  Breathing stable.  No chest pain or tightness.  Acid reflux controlled.  Bowels stable.  Recent labs revealed a1c - 6.8.  Improved.  Cholesterol elevated.  We discussed this at length today.  Discussed the need for diet, exercise and weight loss.  Discussed with her my desire to restart medication.  She declines.  Wants to work on diet and exercise.       Past Medical History  Diagnosis Date  . History of migraine headaches   . Hypothyroidism     s/p removal of right thyroid lobe and isthmus (1992)  . GERD (gastroesophageal reflux disease)   . Hiatal hernia   . Hypertension   . Hypercholesterolemia   . Hyperglycemia   . Diverticulosis     Outpatient Encounter Prescriptions as of 12/09/2012  Medication Sig  . levothyroxine (SYNTHROID) 100 MCG tablet Take 1 tablet (100 mcg total) by mouth daily. Name brand only  . lisinopril (PRINIVIL,ZESTRIL) 10 MG tablet TAKE 1 TABLET BY MOUTH EVERY DAY  . omeprazole (PRILOSEC) 20 MG capsule TAKE 1 CAPSULE BY MOUTH ONCE A DAY  . pravastatin (PRAVACHOL) 20 MG tablet Take 1 tablet (20 mg total) by mouth daily.  . propranolol (INDERAL) 40 MG tablet TAKE 1 TABLET BY MOUTH TWICE A DAY  . [DISCONTINUED] lisinopril (PRINIVIL,ZESTRIL) 10 MG tablet Take 1 tablet (10 mg total) by mouth daily.    Review of Systems Patient denies any lightheadedness or dizziness.  No headache problems reported today.  No increased sinus or allergy problems.  No chest pain, tightness or palpitations.  No increased shortness of breath, cough or congestion.  No nausea or vomiting.  Acid reflux controlled.  No abdominal pain or cramping.  No bowel change, such  as diarrhea, constipation, BRBPR or melana.  No urine change.   A1c increasing as outlined.  Has been doing more walking.  Pm sugars averaging <180.       Objective:   Physical Exam  Filed Vitals:   12/09/12 1506  BP: 120/70  Pulse: 64  Temp: 98.2 F (54.13 C)   72 year old female in no acute distress.   HEENT:  Nares- clear.  Oropharynx - without lesions. NECK:  Supple.  Nontender.  No audible bruit.  HEART:  Appears to be regular. LUNGS:  No crackles or wheezing audible.  Respirations even and unlabored.  RADIAL PULSE:  Equal bilaterally. ABDOMEN:  Soft, nontender.  Bowel sounds present and normal.  No audible abdominal bruit.   EXTREMITIES:  No increased edema present.  DP pulses palpable and equal bilaterally.  FEET:  No lesions.            Assessment & Plan:  HEALTH MAINTENANCE.  Physical 03/11/12.  Colonoscopy 09/04/12 - diverticulosis.    Mammogram 08/05/12 - Birads II.

## 2012-12-09 NOTE — Progress Notes (Signed)
Pre-visit discussion using our clinic review tool. No additional management support is needed unless otherwise documented below in the visit note.  

## 2012-12-12 ENCOUNTER — Encounter: Payer: Self-pay | Admitting: Internal Medicine

## 2012-12-12 DIAGNOSIS — R945 Abnormal results of liver function studies: Secondary | ICD-10-CM | POA: Insufficient documentation

## 2012-12-12 NOTE — Assessment & Plan Note (Addendum)
A1c just checked - 6.8.  Discussed the importance of continued diet, exercise and weight loss.  Follow.

## 2012-12-12 NOTE — Assessment & Plan Note (Signed)
Continue current med regimen.  Follow tsh.    

## 2012-12-12 NOTE — Assessment & Plan Note (Signed)
She declines Evista, bisphosphonates and nasal spray - as treatment for her bones.  Continue vitamin D supplementation.    

## 2012-12-12 NOTE — Assessment & Plan Note (Signed)
Recheck liver panel.  

## 2012-12-12 NOTE — Assessment & Plan Note (Addendum)
Low cholesterol diet and exercise.  Follow lipid panel and liver function.  Lipid panel just checked 08/05/12 revealed total cholesterol 218, triglycerides 154, HDL 45 and LDL 155.  She had wanted to work on diet and exercise and stop her cholesterol medication and see if she can control her cholesterol without medication.  Cholesterol elevated.  Discussed restarting her cholesterol medication.  She declines.  Wants to follow a while longer.

## 2012-12-12 NOTE — Assessment & Plan Note (Signed)
Continue Propranolol.  Appears to be stable.   

## 2012-12-12 NOTE — Assessment & Plan Note (Signed)
Blood pressure under good control.  Same meds.  Follow met b.  

## 2013-01-06 ENCOUNTER — Encounter (INDEPENDENT_AMBULATORY_CARE_PROVIDER_SITE_OTHER): Payer: Self-pay

## 2013-01-06 ENCOUNTER — Other Ambulatory Visit (INDEPENDENT_AMBULATORY_CARE_PROVIDER_SITE_OTHER): Payer: Medicare Other

## 2013-01-06 DIAGNOSIS — R945 Abnormal results of liver function studies: Secondary | ICD-10-CM

## 2013-01-06 DIAGNOSIS — R7989 Other specified abnormal findings of blood chemistry: Secondary | ICD-10-CM

## 2013-01-06 LAB — HEPATIC FUNCTION PANEL
Albumin: 4 g/dL (ref 3.5–5.2)
Bilirubin, Direct: 0.1 mg/dL (ref 0.0–0.3)
Total Bilirubin: 0.7 mg/dL (ref 0.3–1.2)
Total Protein: 7.1 g/dL (ref 6.0–8.3)

## 2013-01-07 ENCOUNTER — Encounter: Payer: Self-pay | Admitting: *Deleted

## 2013-03-18 ENCOUNTER — Encounter: Payer: Medicare Other | Admitting: Internal Medicine

## 2013-04-01 ENCOUNTER — Other Ambulatory Visit: Payer: Self-pay | Admitting: *Deleted

## 2013-04-01 MED ORDER — PRAVASTATIN SODIUM 20 MG PO TABS: 20.0000 mg | ORAL_TABLET | Freq: Every day | ORAL | Status: DC

## 2013-04-01 MED ORDER — LISINOPRIL 10 MG PO TABS: 10.0000 mg | ORAL_TABLET | Freq: Every day | ORAL | Status: DC

## 2013-05-19 ENCOUNTER — Other Ambulatory Visit: Payer: Self-pay | Admitting: Internal Medicine

## 2013-07-14 ENCOUNTER — Other Ambulatory Visit (INDEPENDENT_AMBULATORY_CARE_PROVIDER_SITE_OTHER): Payer: Medicare Other

## 2013-07-14 ENCOUNTER — Telehealth: Payer: Self-pay | Admitting: *Deleted

## 2013-07-14 DIAGNOSIS — E039 Hypothyroidism, unspecified: Secondary | ICD-10-CM

## 2013-07-14 DIAGNOSIS — M8580 Other specified disorders of bone density and structure, unspecified site: Secondary | ICD-10-CM

## 2013-07-14 DIAGNOSIS — E119 Type 2 diabetes mellitus without complications: Secondary | ICD-10-CM

## 2013-07-14 DIAGNOSIS — M899 Disorder of bone, unspecified: Secondary | ICD-10-CM

## 2013-07-14 DIAGNOSIS — I10 Essential (primary) hypertension: Secondary | ICD-10-CM

## 2013-07-14 DIAGNOSIS — R7989 Other specified abnormal findings of blood chemistry: Secondary | ICD-10-CM

## 2013-07-14 DIAGNOSIS — E7800 Pure hypercholesterolemia, unspecified: Secondary | ICD-10-CM

## 2013-07-14 DIAGNOSIS — M949 Disorder of cartilage, unspecified: Secondary | ICD-10-CM

## 2013-07-14 DIAGNOSIS — R945 Abnormal results of liver function studies: Secondary | ICD-10-CM

## 2013-07-14 NOTE — Telephone Encounter (Signed)
Orders placed for labs

## 2013-07-14 NOTE — Telephone Encounter (Signed)
What labs and dx?  

## 2013-07-15 LAB — CBC WITH DIFFERENTIAL/PLATELET
BASOS ABS: 0 10*3/uL (ref 0.0–0.1)
Basophils Relative: 0.3 % (ref 0.0–3.0)
EOS ABS: 0.2 10*3/uL (ref 0.0–0.7)
Eosinophils Relative: 2.3 % (ref 0.0–5.0)
HCT: 43.5 % (ref 36.0–46.0)
Hemoglobin: 14.5 g/dL (ref 12.0–15.0)
Lymphocytes Relative: 19.9 % (ref 12.0–46.0)
Lymphs Abs: 1.6 10*3/uL (ref 0.7–4.0)
MCHC: 33.4 g/dL (ref 30.0–36.0)
MCV: 91.3 fl (ref 78.0–100.0)
MONO ABS: 0.3 10*3/uL (ref 0.1–1.0)
Monocytes Relative: 3.8 % (ref 3.0–12.0)
NEUTROS ABS: 5.9 10*3/uL (ref 1.4–7.7)
Neutrophils Relative %: 73.7 % (ref 43.0–77.0)
Platelets: 257 10*3/uL (ref 150.0–400.0)
RBC: 4.77 Mil/uL (ref 3.87–5.11)
RDW: 13.2 % (ref 11.5–15.5)
WBC: 8 10*3/uL (ref 4.0–10.5)

## 2013-07-15 LAB — LIPID PANEL
CHOLESTEROL: 212 mg/dL — AB (ref 0–200)
HDL: 47.5 mg/dL (ref >39.00)
LDL Cholesterol: 133 mg/dL — ABNORMAL HIGH (ref 0–99)
NonHDL: 164.5
Total CHOL/HDL Ratio: 4
Triglycerides: 158 mg/dL — ABNORMAL HIGH (ref 0.0–149.0)
VLDL: 31.6 mg/dL (ref 0.0–40.0)

## 2013-07-15 LAB — BASIC METABOLIC PANEL
BUN: 14 mg/dL (ref 6–23)
CO2: 27 mEq/L (ref 19–32)
Calcium: 9.3 mg/dL (ref 8.4–10.5)
Chloride: 107 mEq/L (ref 96–112)
Creatinine, Ser: 0.6 mg/dL (ref 0.4–1.2)
GFR: 97.02 mL/min (ref >60.00)
Glucose, Bld: 110 mg/dL — ABNORMAL HIGH (ref 70–99)
Potassium: 4.5 mEq/L (ref 3.5–5.1)
Sodium: 141 mEq/L (ref 135–145)

## 2013-07-15 LAB — HEPATIC FUNCTION PANEL
ALBUMIN: 4 g/dL (ref 3.5–5.2)
ALT: 54 U/L — ABNORMAL HIGH (ref 0–35)
AST: 38 U/L — ABNORMAL HIGH (ref 0–37)
Alkaline Phosphatase: 85 U/L (ref 39–117)
Bilirubin, Direct: 0.1 mg/dL (ref 0.0–0.3)
TOTAL PROTEIN: 7.1 g/dL (ref 6.0–8.3)
Total Bilirubin: 0.6 mg/dL (ref 0.2–1.2)

## 2013-07-15 LAB — VITAMIN D 25 HYDROXY (VIT D DEFICIENCY, FRACTURES): VITD: 24.1 ng/mL

## 2013-07-15 LAB — HEMOGLOBIN A1C: HEMOGLOBIN A1C: 6.9 % — AB (ref 4.6–6.5)

## 2013-07-15 LAB — TSH: TSH: 3.74 u[IU]/mL (ref 0.35–4.50)

## 2013-07-19 ENCOUNTER — Encounter: Payer: Self-pay | Admitting: Internal Medicine

## 2013-07-19 ENCOUNTER — Ambulatory Visit (INDEPENDENT_AMBULATORY_CARE_PROVIDER_SITE_OTHER): Payer: Medicare Other | Admitting: Internal Medicine

## 2013-07-19 VITALS — BP 110/70 | HR 57 | Temp 97.8°F | Ht 64.0 in | Wt 193.5 lb

## 2013-07-19 DIAGNOSIS — E7800 Pure hypercholesterolemia, unspecified: Secondary | ICD-10-CM

## 2013-07-19 DIAGNOSIS — M899 Disorder of bone, unspecified: Secondary | ICD-10-CM

## 2013-07-19 DIAGNOSIS — R7989 Other specified abnormal findings of blood chemistry: Secondary | ICD-10-CM

## 2013-07-19 DIAGNOSIS — E669 Obesity, unspecified: Secondary | ICD-10-CM

## 2013-07-19 DIAGNOSIS — E66811 Obesity, class 1: Secondary | ICD-10-CM

## 2013-07-19 DIAGNOSIS — Z8669 Personal history of other diseases of the nervous system and sense organs: Secondary | ICD-10-CM

## 2013-07-19 DIAGNOSIS — E119 Type 2 diabetes mellitus without complications: Secondary | ICD-10-CM

## 2013-07-19 DIAGNOSIS — M8580 Other specified disorders of bone density and structure, unspecified site: Secondary | ICD-10-CM

## 2013-07-19 DIAGNOSIS — I10 Essential (primary) hypertension: Secondary | ICD-10-CM

## 2013-07-19 DIAGNOSIS — E039 Hypothyroidism, unspecified: Secondary | ICD-10-CM

## 2013-07-19 DIAGNOSIS — R945 Abnormal results of liver function studies: Secondary | ICD-10-CM

## 2013-07-19 DIAGNOSIS — M949 Disorder of cartilage, unspecified: Secondary | ICD-10-CM

## 2013-07-19 MED ORDER — NYSTATIN 100000 UNIT/GM EX CREA: 1.0000 "application " | TOPICAL_CREAM | Freq: Two times a day (BID) | CUTANEOUS | Status: DC

## 2013-07-19 NOTE — Progress Notes (Signed)
  Subjective:    Patient ID: Veronica Branch, female    DOB: 10/03/1940, 73 y.o.   MRN: [id number]  HPI 73 year old female with past history of migraine headaches, GERD, hypertension, hypercholesterolemia, hyperglycemia and hypothyroidism.  She comes in today for a scheduled follow up.  She states she is doing relatively well.  Breathing stable.  No chest pain or tightness.  Acid reflux controlled.  Bowels stable.  Recent labs revealed a1c - 6.9.  We discussed the need for diet, exercise and weight loss.  Discussed medications.  She wants to try diet and exercise before medication.  LFTs elevated.  Discussed further w/up with her.  She agreed to abdominal ultrasound.  No abdominal pain.  No nausea or vomiting.  No bowel change.        Past Medical History  Diagnosis Date  . History of migraine headaches   . Hypothyroidism     s/p removal of right thyroid lobe and isthmus (1992)  . GERD (gastroesophageal reflux disease)   . Hiatal hernia   . Hypertension   . Hypercholesterolemia   . Hyperglycemia   . Diverticulosis     Outpatient Encounter Prescriptions as of 07/19/2013  Medication Sig  . levothyroxine (SYNTHROID) 100 MCG tablet Take 1 tablet (100 mcg total) by mouth daily. Name brand only  . lisinopril (PRINIVIL,ZESTRIL) 10 MG tablet Take 1 tablet (10 mg total) by mouth daily.  Marland Kitchen. omeprazole (PRILOSEC) 20 MG capsule TAKE 1 CAPSULE BY MOUTH ONCE A DAY  . propranolol (INDERAL) 40 MG tablet TAKE 1 TABLET BY MOUTH TWICE A DAY  . [DISCONTINUED] pravastatin (PRAVACHOL) 20 MG tablet Take 1 tablet (20 mg total) by mouth daily.    Review of Systems Patient denies any lightheadedness or dizziness.  No headache problems reported today.  No increased sinus or allergy problems.  No chest pain, tightness or palpitations.  No increased shortness of breath, cough or congestion.  No nausea or vomiting.  Acid reflux controlled.  No abdominal pain or cramping.  No bowel change, such as diarrhea,  constipation, BRBPR or melana.  No urine change.   A1c increasing as outlined.  Discussed diet and exercise.        Objective:   Physical Exam  Filed Vitals:   07/19/13 0908  BP: 110/70  Pulse: 57  Temp: 97.8 F (36.6 C)   Blood pressure recheck:  126/80, pulse 4264  73 year old female in no acute distress.   HEENT:  Nares- clear.  Oropharynx - without lesions. NECK:  Supple.  Nontender.  No audible bruit.  HEART:  Appears to be regular. LUNGS:  No crackles or wheezing audible.  Respirations even and unlabored.  RADIAL PULSE:  Equal bilaterally. ABDOMEN:  Soft, nontender.  Bowel sounds present and normal.  No audible abdominal bruit.   EXTREMITIES:  No increased edema present.  DP pulses palpable and equal bilaterally.  FEET:  No lesions.            Assessment & Plan:  HEALTH MAINTENANCE.  Physical 03/11/12.  Colonoscopy 09/04/12 - diverticulosis.    Mammogram 08/05/12 - Birads II.  Schedule f/u mammogram and physical.    I spent 25 minutes with the patient and more than 50% of the time was spent in consultation regarding the above.

## 2013-07-19 NOTE — Progress Notes (Signed)
Pre visit review using our clinic review tool, if applicable. No additional management support is needed unless otherwise documented below in the visit note. 

## 2013-07-22 ENCOUNTER — Encounter: Payer: Self-pay | Admitting: Internal Medicine

## 2013-07-22 DIAGNOSIS — Z6837 Body mass index (BMI) 37.0-37.9, adult: Secondary | ICD-10-CM | POA: Insufficient documentation

## 2013-07-22 NOTE — Assessment & Plan Note (Signed)
Blood pressure under good control.  Same meds.  Follow met b.  

## 2013-07-22 NOTE — Assessment & Plan Note (Signed)
Continue Propranolol.  Appears to be stable.

## 2013-07-22 NOTE — Assessment & Plan Note (Signed)
Liver function slightly elevated.  Recheck soon.  Will check abdominal ultrasound.

## 2013-07-22 NOTE — Assessment & Plan Note (Signed)
She declines Evista, bisphosphonates and nasal spray - as treatment for her bones.  Continue vitamin D supplementation.

## 2013-07-22 NOTE — Assessment & Plan Note (Signed)
Low cholesterol diet and exercise.  Follow lipid panel and liver function.  Lipid panel just checked revealed total cholesterol 212, triglycerides 158, HDL 48 and LDL 133.  She wants to work on diet and exercise.

## 2013-07-22 NOTE — Assessment & Plan Note (Signed)
A1c just checked - 6.9.  Discussed the importance of continued diet, exercise and weight loss.  Follow.  Desires no medication.  Wants to work on diet and exercise.  Follow.

## 2013-07-22 NOTE — Assessment & Plan Note (Signed)
Continue current med regimen.  Follow tsh.

## 2013-07-22 NOTE — Assessment & Plan Note (Signed)
Discussed diet and exercise.  Discussed need for weight loss.  Follow.

## 2013-07-28 ENCOUNTER — Other Ambulatory Visit: Payer: Medicare Other

## 2013-07-29 ENCOUNTER — Other Ambulatory Visit (INDEPENDENT_AMBULATORY_CARE_PROVIDER_SITE_OTHER): Payer: Medicare Other

## 2013-07-29 DIAGNOSIS — R7989 Other specified abnormal findings of blood chemistry: Secondary | ICD-10-CM

## 2013-07-29 DIAGNOSIS — R945 Abnormal results of liver function studies: Secondary | ICD-10-CM

## 2013-07-29 LAB — HEPATIC FUNCTION PANEL
ALK PHOS: 75 U/L (ref 39–117)
ALT: 59 U/L — AB (ref 0–35)
AST: 38 U/L — ABNORMAL HIGH (ref 0–37)
Albumin: 3.8 g/dL (ref 3.5–5.2)
BILIRUBIN DIRECT: 0 mg/dL (ref 0.0–0.3)
BILIRUBIN TOTAL: 0.8 mg/dL (ref 0.2–1.2)
TOTAL PROTEIN: 7.1 g/dL (ref 6.0–8.3)

## 2013-07-31 ENCOUNTER — Other Ambulatory Visit: Payer: Self-pay | Admitting: Internal Medicine

## 2013-07-31 DIAGNOSIS — R7989 Other specified abnormal findings of blood chemistry: Secondary | ICD-10-CM

## 2013-07-31 DIAGNOSIS — R945 Abnormal results of liver function studies: Secondary | ICD-10-CM

## 2013-07-31 NOTE — Progress Notes (Signed)
Order placed for f/u liver panel.  

## 2013-08-07 ENCOUNTER — Telehealth: Payer: Self-pay | Admitting: Internal Medicine

## 2013-08-07 NOTE — Telephone Encounter (Signed)
Notify pt that I received her abdominal ultrasound results and this reveals fatty liver.  No other abnormality noted.

## 2013-08-08 NOTE — Telephone Encounter (Signed)
Left vm requesting pt to return my call 

## 2013-08-10 NOTE — Telephone Encounter (Signed)
Left message for pt to return my call.

## 2013-08-11 NOTE — Telephone Encounter (Signed)
Called and advised patient of results

## 2013-08-15 ENCOUNTER — Encounter: Payer: Self-pay | Admitting: *Deleted

## 2013-08-15 NOTE — Progress Notes (Signed)
Chart reviewed for DM bundle. Appt sch 10/06/13

## 2013-08-23 ENCOUNTER — Other Ambulatory Visit: Payer: Self-pay | Admitting: Internal Medicine

## 2013-08-24 ENCOUNTER — Other Ambulatory Visit: Payer: Self-pay | Admitting: *Deleted

## 2013-08-24 MED ORDER — LISINOPRIL 10 MG PO TABS: 10.0000 mg | ORAL_TABLET | Freq: Every day | ORAL | Status: DC

## 2013-09-05 ENCOUNTER — Telehealth: Payer: Self-pay | Admitting: Internal Medicine

## 2013-09-05 ENCOUNTER — Ambulatory Visit: Payer: Medicare Other

## 2013-09-05 ENCOUNTER — Other Ambulatory Visit (INDEPENDENT_AMBULATORY_CARE_PROVIDER_SITE_OTHER): Payer: Medicare Other

## 2013-09-05 DIAGNOSIS — R945 Abnormal results of liver function studies: Secondary | ICD-10-CM

## 2013-09-05 DIAGNOSIS — R7989 Other specified abnormal findings of blood chemistry: Secondary | ICD-10-CM

## 2013-09-05 LAB — HEPATIC FUNCTION PANEL
ALT: 54 U/L — ABNORMAL HIGH (ref 0–35)
AST: 42 U/L — ABNORMAL HIGH (ref 0–37)
Albumin: 3.6 g/dL (ref 3.5–5.2)
Alkaline Phosphatase: 81 U/L (ref 39–117)
BILIRUBIN TOTAL: 0.9 mg/dL (ref 0.2–1.2)
Bilirubin, Direct: 0.1 mg/dL (ref 0.0–0.3)
Total Protein: 6.3 g/dL (ref 6.0–8.3)

## 2013-09-05 NOTE — Telephone Encounter (Signed)
The  [facility] @ 10:20.

## 2013-09-06 ENCOUNTER — Ambulatory Visit: Payer: Medicare Other

## 2013-09-06 ENCOUNTER — Encounter: Payer: Self-pay | Admitting: *Deleted

## 2013-09-19 ENCOUNTER — Other Ambulatory Visit: Payer: Self-pay | Admitting: Internal Medicine

## 2013-09-23 ENCOUNTER — Encounter: Payer: Self-pay | Admitting: Internal Medicine

## 2013-10-06 ENCOUNTER — Ambulatory Visit (INDEPENDENT_AMBULATORY_CARE_PROVIDER_SITE_OTHER): Payer: Medicare Other | Admitting: Internal Medicine

## 2013-10-06 ENCOUNTER — Encounter: Payer: Self-pay | Admitting: Internal Medicine

## 2013-10-06 VITALS — BP 110/70 | HR 65 | Temp 98.0°F | Ht 60.0 in | Wt 193.0 lb

## 2013-10-06 DIAGNOSIS — R7989 Other specified abnormal findings of blood chemistry: Secondary | ICD-10-CM

## 2013-10-06 DIAGNOSIS — M949 Disorder of cartilage, unspecified: Secondary | ICD-10-CM

## 2013-10-06 DIAGNOSIS — I10 Essential (primary) hypertension: Secondary | ICD-10-CM

## 2013-10-06 DIAGNOSIS — Z8669 Personal history of other diseases of the nervous system and sense organs: Secondary | ICD-10-CM

## 2013-10-06 DIAGNOSIS — E039 Hypothyroidism, unspecified: Secondary | ICD-10-CM

## 2013-10-06 DIAGNOSIS — Z23 Encounter for immunization: Secondary | ICD-10-CM

## 2013-10-06 DIAGNOSIS — R945 Abnormal results of liver function studies: Secondary | ICD-10-CM

## 2013-10-06 DIAGNOSIS — E669 Obesity, unspecified: Secondary | ICD-10-CM

## 2013-10-06 DIAGNOSIS — E119 Type 2 diabetes mellitus without complications: Secondary | ICD-10-CM

## 2013-10-06 DIAGNOSIS — M8580 Other specified disorders of bone density and structure, unspecified site: Secondary | ICD-10-CM

## 2013-10-06 DIAGNOSIS — M899 Disorder of bone, unspecified: Secondary | ICD-10-CM

## 2013-10-06 DIAGNOSIS — E7800 Pure hypercholesterolemia, unspecified: Secondary | ICD-10-CM

## 2013-10-06 NOTE — Progress Notes (Signed)
  Subjective:    Patient ID: Veronica Branch, female    DOB: June 21, 1940, 73 y.o.   MRN: [id number]  HPI 73 year old female with past history of migraine headaches, GERD, hypertension, hypercholesterolemia, hyperglycemia and hypothyroidism.  She comes in today to follow up on these issues as well as for a complete physical exam.   She states she is doing relatively well.  Breathing stable.  No chest pain or tightness.  Acid reflux controlled.  Bowels stable.  Last a1c - 6.9 (07/14/13).  We again discussed the need for diet, exercise and weight loss.   She states she is trying to walk more.  Am sugars averaging < 100.  LFTs elevated.  Ultrasound revealed fatty liver.  No abdominal pain.  No nausea or vomiting.  No bowel change.        Past Medical History  Diagnosis Date  . History of migraine headaches   . Hypothyroidism     s/p removal of right thyroid lobe and isthmus (1992)  . GERD (gastroesophageal reflux disease)   . Hiatal hernia   . Hypertension   . Hypercholesterolemia   . Hyperglycemia   . Diverticulosis     Outpatient Encounter Prescriptions as of 10/06/2013  Medication Sig  . lisinopril (PRINIVIL,ZESTRIL) 10 MG tablet Take 1 tablet (10 mg total) by mouth daily.  Marland Kitchen nystatin cream (MYCOSTATIN) Apply 1 application topically 2 (two) times daily.  Marland Kitchen omeprazole (PRILOSEC) 20 MG capsule TAKE 1 CAPSULE BY MOUTH EVERY DAY  . propranolol (INDERAL) 40 MG tablet TAKE 1 TABLET BY MOUTH TWICE A DAY  . SYNTHROID 100 MCG tablet TAKE 1 TABLET BY MOUTH EVERY DAY    Review of Systems Patient denies any lightheadedness or dizziness.  No headache problems reported today.  No increased sinus or allergy problems.  No chest pain, tightness or palpitations.  No increased shortness of breath, cough or congestion.  No nausea or vomiting.  Acid reflux controlled.  No abdominal pain or cramping.  No bowel change, such as diarrhea, constipation, BRBPR or melana.  No urine change.   A1c 6.9 on last check.    Discussed diet and exercise.        Objective:   Physical Exam  Filed Vitals:   10/06/13 1345  BP: 110/70  Pulse: 65  Temp: 98 F (36.7 C)   Blood pressure recheck:  31/50  73 year old female in no acute distress.   HEENT:  Nares- clear.  Oropharynx - without lesions. NECK:  Supple.  Nontender.  No audible bruit.  HEART:  Appears to be regular. LUNGS:  No crackles or wheezing audible.  Respirations even and unlabored.  RADIAL PULSE:  Equal bilaterally.    BREASTS:  No nipple discharge or nipple retraction present.  Could not appreciate any distinct nodules or axillary adenopathy.  ABDOMEN:  Soft, nontender.  Bowel sounds present and normal.  No audible abdominal bruit.  GU:  Not performed.     EXTREMITIES:  No increased edema present.  DP pulses palpable and equal bilaterally.      FEET:  No lesions.      Assessment & Plan:  HEALTH MAINTENANCE.  Physical today.  Colonoscopy 09/04/12 - diverticulosis.    Mammogram 08/05/12 - Birads II.  Schedule f/u mammogram.     I spent 25 minutes with the patient and more than 50% of the time was spent in consultation regarding the above.

## 2013-10-09 ENCOUNTER — Encounter: Payer: Self-pay | Admitting: Internal Medicine

## 2013-10-09 NOTE — Assessment & Plan Note (Signed)
A1c last checked - 6.9.  Discussed the importance of continued diet, exercise and weight loss.  Follow.  Desires no medication.  Wants to work on diet and exercise.  Follow.

## 2013-10-09 NOTE — Assessment & Plan Note (Signed)
Continue current med regimen.  Follow tsh.    

## 2013-10-09 NOTE — Assessment & Plan Note (Signed)
Low cholesterol diet and exercise.  Follow lipid panel and liver function.  She wants to work on diet and exercise.

## 2013-10-09 NOTE — Assessment & Plan Note (Signed)
Discussed diet and exercise.  Discussed need for weight loss.  Follow.    

## 2013-10-09 NOTE — Assessment & Plan Note (Signed)
She declines Evista, bisphosphonates and nasal spray - as treatment for her bones.  Continue vitamin D supplementation.    

## 2013-10-09 NOTE — Assessment & Plan Note (Signed)
Continue Propranolol.  Appears to be stable.   

## 2013-10-09 NOTE — Assessment & Plan Note (Signed)
Blood pressure under good control.  Same meds.  Follow met b.  

## 2013-10-09 NOTE — Assessment & Plan Note (Signed)
Abdominal ultrasound reveals fatty liver.  Treat sugars.  Diet, exercise and weight loss.  Recheck liver panel.

## 2013-10-11 ENCOUNTER — Ambulatory Visit: Payer: Self-pay | Admitting: Internal Medicine

## 2013-10-11 LAB — HM MAMMOGRAPHY: HM MAMMO: NEGATIVE

## 2013-10-12 ENCOUNTER — Encounter: Payer: Self-pay | Admitting: Internal Medicine

## 2013-10-20 ENCOUNTER — Other Ambulatory Visit (INDEPENDENT_AMBULATORY_CARE_PROVIDER_SITE_OTHER): Payer: Medicare Other

## 2013-10-20 DIAGNOSIS — E7800 Pure hypercholesterolemia, unspecified: Secondary | ICD-10-CM

## 2013-10-20 DIAGNOSIS — R7989 Other specified abnormal findings of blood chemistry: Secondary | ICD-10-CM

## 2013-10-20 DIAGNOSIS — R945 Abnormal results of liver function studies: Secondary | ICD-10-CM

## 2013-10-20 DIAGNOSIS — E119 Type 2 diabetes mellitus without complications: Secondary | ICD-10-CM

## 2013-10-20 LAB — BASIC METABOLIC PANEL
BUN: 11 mg/dL (ref 6–23)
CHLORIDE: 108 meq/L (ref 96–112)
CO2: 25 mEq/L (ref 19–32)
Calcium: 9.2 mg/dL (ref 8.4–10.5)
Creatinine, Ser: 0.8 mg/dL (ref 0.4–1.2)
GFR: 72.84 mL/min (ref >60.00)
Glucose, Bld: 114 mg/dL — ABNORMAL HIGH (ref 70–99)
POTASSIUM: 4.2 meq/L (ref 3.5–5.1)
Sodium: 140 mEq/L (ref 135–145)

## 2013-10-20 LAB — LIPID PANEL
CHOLESTEROL: 205 mg/dL — AB (ref 0–200)
HDL: 46.4 mg/dL (ref >39.00)
LDL Cholesterol: 134 mg/dL — ABNORMAL HIGH (ref 0–99)
NonHDL: 158.6
Total CHOL/HDL Ratio: 4
Triglycerides: 124 mg/dL (ref 0.0–149.0)
VLDL: 24.8 mg/dL (ref 0.0–40.0)

## 2013-10-20 LAB — HEPATIC FUNCTION PANEL
ALBUMIN: 3.9 g/dL (ref 3.5–5.2)
ALT: 73 U/L — AB (ref 0–35)
AST: 43 U/L — AB (ref 0–37)
Alkaline Phosphatase: 78 U/L (ref 39–117)
Bilirubin, Direct: 0 mg/dL (ref 0.0–0.3)
Total Bilirubin: 0.4 mg/dL (ref 0.2–1.2)
Total Protein: 7.5 g/dL (ref 6.0–8.3)

## 2013-10-20 LAB — HEMOGLOBIN A1C: HEMOGLOBIN A1C: 6.7 % — AB (ref 4.6–6.5)

## 2013-10-26 ENCOUNTER — Other Ambulatory Visit: Payer: Self-pay | Admitting: Internal Medicine

## 2013-10-26 DIAGNOSIS — R7989 Other specified abnormal findings of blood chemistry: Secondary | ICD-10-CM

## 2013-10-26 DIAGNOSIS — R945 Abnormal results of liver function studies: Secondary | ICD-10-CM

## 2013-10-26 NOTE — Progress Notes (Signed)
Order placed for f/u labs.  

## 2013-10-27 ENCOUNTER — Encounter: Payer: Self-pay | Admitting: *Deleted

## 2013-11-17 ENCOUNTER — Other Ambulatory Visit (INDEPENDENT_AMBULATORY_CARE_PROVIDER_SITE_OTHER): Payer: Medicare Other

## 2013-11-17 DIAGNOSIS — R945 Abnormal results of liver function studies: Secondary | ICD-10-CM

## 2013-11-17 DIAGNOSIS — R7989 Other specified abnormal findings of blood chemistry: Secondary | ICD-10-CM

## 2013-11-17 LAB — HEPATIC FUNCTION PANEL
ALT: 81 U/L — ABNORMAL HIGH (ref 0–35)
AST: 65 U/L — ABNORMAL HIGH (ref 0–37)
Albumin: 3.2 g/dL — ABNORMAL LOW (ref 3.5–5.2)
Alkaline Phosphatase: 83 U/L (ref 39–117)
Bilirubin, Direct: 0.1 mg/dL (ref 0.0–0.3)
Total Bilirubin: 0.9 mg/dL (ref 0.2–1.2)
Total Protein: 6.6 g/dL (ref 6.0–8.3)

## 2013-11-21 ENCOUNTER — Other Ambulatory Visit: Payer: Self-pay | Admitting: Internal Medicine

## 2013-11-21 DIAGNOSIS — R7989 Other specified abnormal findings of blood chemistry: Secondary | ICD-10-CM

## 2013-11-21 DIAGNOSIS — R945 Abnormal results of liver function studies: Secondary | ICD-10-CM

## 2013-11-21 NOTE — Progress Notes (Signed)
Order placed for referral to GI 

## 2013-11-28 ENCOUNTER — Other Ambulatory Visit: Payer: Self-pay | Admitting: Internal Medicine

## 2014-01-27 ENCOUNTER — Telehealth: Payer: Self-pay | Admitting: Internal Medicine

## 2014-01-27 NOTE — Telephone Encounter (Signed)
Pt request to have lab done at the office per Dr. Bluford Kaufmannh. Pt has rx for ANA titer, AMA titer, ASMA titer, Hep Bs AG, Hep Bc Ab, Hep C Ab, Fe, ferrtin levels. Please advise if okay to schedule. rx in dr. Roby LoftsScott's box/msn

## 2014-01-27 NOTE — Telephone Encounter (Signed)
Please advise, Rx in your green folder

## 2014-01-31 NOTE — Telephone Encounter (Signed)
Would prefer pt to get these drawn at GI.  See me about this.

## 2014-01-31 NOTE — Telephone Encounter (Signed)
Left patient a voicemail to notify her & [facility] with a note attached

## 2014-02-06 ENCOUNTER — Ambulatory Visit (INDEPENDENT_AMBULATORY_CARE_PROVIDER_SITE_OTHER): Payer: Medicare Other | Admitting: Internal Medicine

## 2014-02-06 ENCOUNTER — Encounter: Payer: Self-pay | Admitting: Internal Medicine

## 2014-02-06 VITALS — BP 104/68 | HR 65 | Temp 97.6°F | Ht 60.0 in | Wt 190.8 lb

## 2014-02-06 DIAGNOSIS — R945 Abnormal results of liver function studies: Secondary | ICD-10-CM

## 2014-02-06 DIAGNOSIS — E669 Obesity, unspecified: Secondary | ICD-10-CM

## 2014-02-06 DIAGNOSIS — E7800 Pure hypercholesterolemia, unspecified: Secondary | ICD-10-CM

## 2014-02-06 DIAGNOSIS — E039 Hypothyroidism, unspecified: Secondary | ICD-10-CM

## 2014-02-06 DIAGNOSIS — E559 Vitamin D deficiency, unspecified: Secondary | ICD-10-CM

## 2014-02-06 DIAGNOSIS — R7989 Other specified abnormal findings of blood chemistry: Secondary | ICD-10-CM

## 2014-02-06 DIAGNOSIS — M8580 Other specified disorders of bone density and structure, unspecified site: Secondary | ICD-10-CM

## 2014-02-06 DIAGNOSIS — I10 Essential (primary) hypertension: Secondary | ICD-10-CM

## 2014-02-06 DIAGNOSIS — E119 Type 2 diabetes mellitus without complications: Secondary | ICD-10-CM

## 2014-02-06 NOTE — Progress Notes (Signed)
Pre visit review using our clinic review tool, if applicable. No additional management support is needed unless otherwise documented below in the visit note. 

## 2014-02-09 ENCOUNTER — Encounter: Payer: Self-pay | Admitting: Internal Medicine

## 2014-02-09 DIAGNOSIS — E559 Vitamin D deficiency, unspecified: Secondary | ICD-10-CM | POA: Insufficient documentation

## 2014-02-09 DIAGNOSIS — E669 Obesity, unspecified: Secondary | ICD-10-CM | POA: Insufficient documentation

## 2014-02-09 NOTE — Progress Notes (Signed)
Subjective:    Patient ID: Veronica DancerBetty T Branch, female    DOB: 07/28/1940, 74 y.o.   MRN: [id number]  HPI 74 year old female with past history of migraine headaches, GERD, hypertension, hypercholesterolemia, hyperglycemia and hypothyroidism.  She comes in today for a scheduled follow up.  She states she is doing relatively well.  Breathing stable.  No chest pain or tightness.  Acid reflux controlled.  Bowels stable.  Last a1c - 6.7 (10/20/13).  We again discussed the need for diet, exercise and weight loss.   She states she is trying to walk more.  Not exercising regularly.  Not watching her diet.  Brought in no recorded sugar readings.  Has a history of  Elevated LFTs .  Ultrasound revealed fatty liver.  No abdominal pain.  No nausea or vomiting.  No bowel change.   She does report previously noticing a protrusion around her naval.  Was told probable hernia.  Better now.  No pain.       Past Medical History  Diagnosis Date  . History of migraine headaches   . Hypothyroidism     s/p removal of right thyroid lobe and isthmus (1992)  . GERD (gastroesophageal reflux disease)   . Hiatal hernia   . Hypertension   . Hypercholesterolemia   . Hyperglycemia   . Diverticulosis     Outpatient Encounter Prescriptions as of 02/06/2014  Medication Sig  . lisinopril (PRINIVIL,ZESTRIL) 10 MG tablet Take 1 tablet (10 mg total) by mouth daily.  Marland Kitchen. nystatin cream (MYCOSTATIN) Apply 1 application topically 2 (two) times daily.  Marland Kitchen. omeprazole (PRILOSEC) 20 MG capsule TAKE 1 CAPSULE BY MOUTH EVERY DAY  . propranolol (INDERAL) 40 MG tablet TAKE 1 TABLET BY MOUTH TWICE A DAY  . SYNTHROID 100 MCG tablet TAKE 1 TABLET BY MOUTH EVERY DAY    Review of Systems Patient denies any lightheadedness or dizziness.  No headache problems reported today.  No increased sinus or allergy problems.  No chest pain, tightness or palpitations.  No increased shortness of breath, cough or congestion.  No nausea or vomiting.  Acid reflux  controlled.  No abdominal pain or cramping.  No bowel change, such as diarrhea, constipation, BRBPR or melana.  No urine change.   A1c 6.7 on last check.   Discussed diet and exercise.  Previous naval protrusion as outlined.        Objective:   Physical Exam  Filed Vitals:   02/06/14 1412  BP: 104/68  Pulse: 65  Temp: 97.6 F (36.4 C)   Blood pressure recheck:  36116/8268  74 year old female in no acute distress.   HEENT:  Nares- clear.  Oropharynx - without lesions. NECK:  Supple.  Nontender.  No audible bruit.  HEART:  Appears to be regular. LUNGS:  No crackles or wheezing audible.  Respirations even and unlabored.  RADIAL PULSE:  Equal bilaterally.  ABDOMEN:  Soft, nontender.  Bowel sounds present and normal.  No audible abdominal bruit.  No hernia appreciated on exam.  Non tender.   EXTREMITIES:  No increased edema present.  DP pulses palpable and equal bilaterally.      FEET:  No lesions.      Assessment & Plan:  1. Obesity (BMI 30-39.9) Diet and exercise.    2. Essential hypertension Blood pressure doing well.  Same medication regimen.   - Basic metabolic panel; Future  3. Hypothyroidism, unspecified hypothyroidism type On synthroid.   - TSH; Future  4. Type 2 diabetes  mellitus without complication Discussed diet and exercise.  Discussed weight loss.  She has declined medication.  Follow.   - Hemoglobin A1c; Future - Microalbumin / creatinine urine ratio; Future  5. Osteopenia Weight bearing exercise.  Vitamin D supplements.    6. Abnormal liver function test Ultrasound revealed fatty liver.   - Hepatic function panel; Future  7. Hypercholesterolemia Low cholesterol diet and exercise.   - Lipid panel; Future  8. Vitamin D deficiency Replace vitamin D.  Follow levels.    9.  QUESTION OF HERNIA.  Could not appreciate on exam today.  Non tender.    HEALTH MAINTENANCE.  Physical 10/06/13.  Colonoscopy 09/04/12 - diverticulosis.    Mammogram 10/11/13 - Birads II.    I spent 25 minutes with the patient and more than 50% of the time was spent in consultation regarding the above.

## 2014-02-20 ENCOUNTER — Other Ambulatory Visit (INDEPENDENT_AMBULATORY_CARE_PROVIDER_SITE_OTHER): Payer: Medicare Other

## 2014-02-20 DIAGNOSIS — E039 Hypothyroidism, unspecified: Secondary | ICD-10-CM

## 2014-02-20 DIAGNOSIS — R945 Abnormal results of liver function studies: Secondary | ICD-10-CM

## 2014-02-20 DIAGNOSIS — E7800 Pure hypercholesterolemia, unspecified: Secondary | ICD-10-CM

## 2014-02-20 DIAGNOSIS — I10 Essential (primary) hypertension: Secondary | ICD-10-CM

## 2014-02-20 DIAGNOSIS — E119 Type 2 diabetes mellitus without complications: Secondary | ICD-10-CM

## 2014-02-20 DIAGNOSIS — R7989 Other specified abnormal findings of blood chemistry: Secondary | ICD-10-CM

## 2014-02-20 LAB — BASIC METABOLIC PANEL
BUN: 10 mg/dL (ref 6–23)
CO2: 27 mEq/L (ref 19–32)
Calcium: 9.3 mg/dL (ref 8.4–10.5)
Chloride: 107 mEq/L (ref 96–112)
Creatinine, Ser: 0.68 mg/dL (ref 0.40–1.20)
GFR: 90.31 mL/min (ref >60.00)
Glucose, Bld: 115 mg/dL — ABNORMAL HIGH (ref 70–99)
Potassium: 4.1 mEq/L (ref 3.5–5.1)
Sodium: 141 mEq/L (ref 135–145)

## 2014-02-20 LAB — LIPID PANEL
CHOL/HDL RATIO: 4
CHOLESTEROL: 188 mg/dL (ref 0–200)
HDL: 48.9 mg/dL (ref >39.00)
LDL Cholesterol: 113 mg/dL — ABNORMAL HIGH (ref 0–99)
NonHDL: 139.1
TRIGLYCERIDES: 132 mg/dL (ref 0.0–149.0)
VLDL: 26.4 mg/dL (ref 0.0–40.0)

## 2014-02-20 LAB — HEMOGLOBIN A1C: HEMOGLOBIN A1C: 7 % — AB (ref 4.6–6.5)

## 2014-02-20 LAB — HEPATIC FUNCTION PANEL
ALBUMIN: 4 g/dL (ref 3.5–5.2)
ALK PHOS: 85 U/L (ref 39–117)
ALT: 46 U/L — AB (ref 0–35)
AST: 36 U/L (ref 0–37)
Bilirubin, Direct: 0.1 mg/dL (ref 0.0–0.3)
Total Bilirubin: 0.6 mg/dL (ref 0.2–1.2)
Total Protein: 6.9 g/dL (ref 6.0–8.3)

## 2014-02-20 LAB — MICROALBUMIN / CREATININE URINE RATIO
CREATININE, U: 145.3 mg/dL
Microalb Creat Ratio: 0.7 mg/g (ref 0.0–30.0)
Microalb, Ur: 1 mg/dL (ref 0.0–1.9)

## 2014-02-20 LAB — TSH: TSH: 2.93 u[IU]/mL (ref 0.35–4.50)

## 2014-02-21 ENCOUNTER — Encounter: Payer: Self-pay | Admitting: *Deleted

## 2014-02-28 ENCOUNTER — Other Ambulatory Visit: Payer: Self-pay | Admitting: Internal Medicine

## 2014-03-01 ENCOUNTER — Other Ambulatory Visit: Payer: Self-pay | Admitting: *Deleted

## 2014-03-01 MED ORDER — GLUCOSE BLOOD VI STRP: ORAL_STRIP | Status: DC

## 2014-03-01 MED ORDER — ONETOUCH ULTRA SYSTEM W/DEVICE KIT: 1.0000 | PACK | Freq: Once | Status: DC

## 2014-03-01 MED ORDER — ONETOUCH ULTRASOFT LANCETS MISC: Status: AC

## 2014-03-16 ENCOUNTER — Emergency Department: Payer: Self-pay | Admitting: Emergency Medicine

## 2014-03-29 ENCOUNTER — Encounter: Payer: Self-pay | Admitting: Internal Medicine

## 2014-03-29 ENCOUNTER — Ambulatory Visit (INDEPENDENT_AMBULATORY_CARE_PROVIDER_SITE_OTHER): Payer: Medicare Other | Admitting: Internal Medicine

## 2014-03-29 VITALS — BP 123/76 | HR 65 | Temp 98.0°F | Ht 60.0 in | Wt 189.5 lb

## 2014-03-29 DIAGNOSIS — E039 Hypothyroidism, unspecified: Secondary | ICD-10-CM

## 2014-03-29 DIAGNOSIS — K5732 Diverticulitis of large intestine without perforation or abscess without bleeding: Secondary | ICD-10-CM

## 2014-03-29 DIAGNOSIS — R7989 Other specified abnormal findings of blood chemistry: Secondary | ICD-10-CM

## 2014-03-29 DIAGNOSIS — D72829 Elevated white blood cell count, unspecified: Secondary | ICD-10-CM

## 2014-03-29 DIAGNOSIS — I10 Essential (primary) hypertension: Secondary | ICD-10-CM

## 2014-03-29 DIAGNOSIS — R945 Abnormal results of liver function studies: Secondary | ICD-10-CM

## 2014-03-29 DIAGNOSIS — E559 Vitamin D deficiency, unspecified: Secondary | ICD-10-CM

## 2014-03-29 DIAGNOSIS — E7800 Pure hypercholesterolemia, unspecified: Secondary | ICD-10-CM

## 2014-03-29 DIAGNOSIS — E119 Type 2 diabetes mellitus without complications: Secondary | ICD-10-CM

## 2014-03-29 DIAGNOSIS — E669 Obesity, unspecified: Secondary | ICD-10-CM

## 2014-03-29 MED ORDER — HYDROCORTISONE ACE-PRAMOXINE 1-1 % RE CREA: 1.0000 "application " | TOPICAL_CREAM | Freq: Two times a day (BID) | RECTAL | Status: DC

## 2014-03-29 NOTE — Progress Notes (Signed)
Patient ID: Veronica Branch, female   DOB: 10-Dec-1940, 74 y.o.   MRN: [id number]   Subjective:    Patient ID: Veronica Branch, female    DOB: 20-Oct-1940, 74 y.o.   MRN: [id number]  HPI  Patient here for a scheduled follow up.  States that two weeks ago, she started having stomach cramps.  Some associated nausea.  Symptoms persisted.  Went to ER.  CT and labs performed.  Diagnosed with diverticulitis.  Treated with cipro and flagyl.  Still taking.  Symptoms have improved.  Abdominal pain better.  She is eating and drinking.  Bowels stable.  No nausea or vomiting.  Does have hemorrhoid flare.  She is working on her diet.  Has cut out candy bars.  Discussed exercise.  Blood sugars averaging 90-120 and pm sugars 120-150.  A1c last checked - 7.0.     Past Medical History  Diagnosis Date  . History of migraine headaches   . Hypothyroidism     s/p removal of right thyroid lobe and isthmus (1992)  . GERD (gastroesophageal reflux disease)   . Hiatal hernia   . Hypertension   . Hypercholesterolemia   . Hyperglycemia   . Diverticulosis     Current Outpatient Prescriptions on File Prior to Visit  Medication Sig Dispense Refill  . Blood Glucose Monitoring Suppl (ONE TOUCH ULTRA SYSTEM KIT) W/DEVICE KIT 1 kit by Does not apply route once. 1 each 0  . glucose blood test strip Use to test blood sugars 1-2 times daily 100 each 12  . Lancets (ONETOUCH ULTRASOFT) lancets Use to test blood sugars 1-2 times daily   E11.9 100 each 12  . lisinopril (PRINIVIL,ZESTRIL) 10 MG tablet TAKE 1 TABLET BY MOUTH EVERY DAY 90 tablet 1  . nystatin cream (MYCOSTATIN) Apply 1 application topically 2 (two) times daily. 30 g 0  . omeprazole (PRILOSEC) 20 MG capsule TAKE 1 CAPSULE BY MOUTH EVERY DAY 30 capsule 5  . propranolol (INDERAL) 40 MG tablet TAKE 1 TABLET BY MOUTH TWICE A DAY 180 tablet 1  . SYNTHROID 100 MCG tablet TAKE 1 TABLET BY MOUTH EVERY DAY 90 tablet 3   No current facility-administered medications on  file prior to visit.    Review of Systems  Constitutional: Negative for appetite change and unexpected weight change.  HENT: Negative for congestion and sinus pressure.   Respiratory: Negative for cough, chest tightness and shortness of breath.   Cardiovascular: Negative for chest pain, palpitations and leg swelling.  Gastrointestinal: Positive for abdominal pain (better.  on cipro and flagyl.  ). Negative for nausea (resolved. ), vomiting, diarrhea and blood in stool.  Genitourinary: Negative for dysuria and difficulty urinating.  Skin: Negative for color change and rash.  Neurological: Negative for dizziness, light-headedness and headaches.       Objective:    Physical Exam  Constitutional: She appears well-developed and well-nourished. No distress.  HENT:  Nose: Nose normal.  Mouth/Throat: Oropharynx is clear and moist.  Neck: Neck supple. No thyromegaly present.  Cardiovascular: Normal rate and regular rhythm.   Pulmonary/Chest: Breath sounds normal. No respiratory distress. She has no wheezes.  Abdominal: Soft. Bowel sounds are normal. There is no tenderness.  Musculoskeletal: She exhibits no edema or tenderness.  Lymphadenopathy:    She has no cervical adenopathy.  Skin: No rash noted. No erythema.  Psychiatric: She has a normal mood and affect. Her behavior is normal.    BP 123/76 mmHg  Pulse 65  Temp(Src) 98 F (  36.7 C) (Oral)  Ht 5' (1.524 m)  Wt 189 lb 8 oz (85.957 kg)  BMI 37.01 kg/m2  SpO2 95% Wt Readings from Last 3 Encounters:  03/29/14 189 lb 8 oz (85.957 kg)  02/06/14 190 lb 12 oz (86.524 kg)  10/06/13 193 lb (87.544 kg)     Lab Results  Component Value Date   WBC 8.0 07/14/2013   HGB 14.5 07/14/2013   HCT 43.5 07/14/2013   PLT 257.0 07/14/2013   GLUCOSE 115* 02/20/2014   CHOL 188 02/20/2014   TRIG 132.0 02/20/2014   HDL 48.90 02/20/2014   LDLDIRECT 156.0 12/06/2012   LDLCALC 113* 02/20/2014   ALT 46* 02/20/2014   AST 36 02/20/2014   NA  141 02/20/2014   K 4.1 02/20/2014   CL 107 02/20/2014   CREATININE 0.68 02/20/2014   BUN 10 02/20/2014   CO2 27 02/20/2014   TSH 2.93 02/20/2014   HGBA1C 7.0* 02/20/2014   MICROALBUR 1.0 02/20/2014      Assessment & Plan:   Problem List Items Addressed This Visit    Abnormal liver function test    Abdominal ultrasound revealed fatty liver.  Also seen on recent CT.  Diet, exercise and weight loss.  Follow liver function tests.        Relevant Orders   Hepatic function panel   Diabetes    She is trying to watch what she eats.  Discussed diet and exercise.  Follow met b and a1c.       Relevant Orders   Hemoglobin A1c   Diverticulitis of colon    Had CT.  On cipro and flagyl.  Symptoms have improved.  Complete.  Follow.        Hypercholesterolemia    Low cholesterol diet and exercise.  Follow lipid panel.       Relevant Orders   Lipid panel   Hypertension - Primary    Blood pressure has been under good control.  Same medication regimen.  Follow metabolic panel.        Relevant Orders   Basic metabolic panel   Hypothyroidism   Obesity (BMI 30-39.9)    Discussed diet, exercise and weight loss.        Vitamin D deficiency    Vitamin D supplements.  Follow vitamin D level.         Other Visit Diagnoses    Leukocytosis        Relevant Orders    CBC with Differential/Platelet      I spent 25 minutes with the patient and more than 50% of the time was spent in consultation regarding the above.     Veronica Pheasant, MD

## 2014-03-29 NOTE — Progress Notes (Signed)
Pre visit review using our clinic review tool, if applicable. No additional management support is needed unless otherwise documented below in the visit note. 

## 2014-04-02 ENCOUNTER — Encounter: Payer: Self-pay | Admitting: Internal Medicine

## 2014-04-02 DIAGNOSIS — K5732 Diverticulitis of large intestine without perforation or abscess without bleeding: Secondary | ICD-10-CM | POA: Insufficient documentation

## 2014-04-02 NOTE — Assessment & Plan Note (Signed)
She is trying to watch what she eats.  Discussed diet and exercise.  Follow met b and a1c.

## 2014-04-02 NOTE — Assessment & Plan Note (Signed)
Low cholesterol diet and exercise.  Follow lipid panel.   

## 2014-04-02 NOTE — Assessment & Plan Note (Signed)
Had CT.  On cipro and flagyl.  Symptoms have improved.  Complete.  Follow.

## 2014-04-02 NOTE — Assessment & Plan Note (Signed)
Blood pressure has been under good control.  Same medication regimen.  Follow metabolic panel.  

## 2014-04-02 NOTE — Assessment & Plan Note (Signed)
Discussed diet, exercise and weight loss

## 2014-04-02 NOTE — Assessment & Plan Note (Signed)
Vitamin D supplements.  Follow vitamin D level.  

## 2014-04-02 NOTE — Assessment & Plan Note (Signed)
Abdominal ultrasound revealed fatty liver.  Also seen on recent CT.  Diet, exercise and weight loss.  Follow liver function tests.

## 2014-05-23 ENCOUNTER — Other Ambulatory Visit: Payer: Self-pay | Admitting: Internal Medicine

## 2014-05-24 ENCOUNTER — Other Ambulatory Visit: Payer: Self-pay | Admitting: Internal Medicine

## 2014-06-01 ENCOUNTER — Other Ambulatory Visit (INDEPENDENT_AMBULATORY_CARE_PROVIDER_SITE_OTHER): Payer: Medicare Other

## 2014-06-01 DIAGNOSIS — D72829 Elevated white blood cell count, unspecified: Secondary | ICD-10-CM | POA: Diagnosis not present

## 2014-06-01 DIAGNOSIS — R7989 Other specified abnormal findings of blood chemistry: Secondary | ICD-10-CM | POA: Diagnosis not present

## 2014-06-01 DIAGNOSIS — E7800 Pure hypercholesterolemia, unspecified: Secondary | ICD-10-CM

## 2014-06-01 DIAGNOSIS — I10 Essential (primary) hypertension: Secondary | ICD-10-CM

## 2014-06-01 DIAGNOSIS — E119 Type 2 diabetes mellitus without complications: Secondary | ICD-10-CM | POA: Diagnosis not present

## 2014-06-01 DIAGNOSIS — R945 Abnormal results of liver function studies: Secondary | ICD-10-CM

## 2014-06-01 LAB — LIPID PANEL
CHOLESTEROL: 190 mg/dL (ref 0–200)
HDL: 45 mg/dL (ref >39.00)
LDL CALC: 120 mg/dL — AB (ref 0–99)
NonHDL: 145
Total CHOL/HDL Ratio: 4
Triglycerides: 127 mg/dL (ref 0.0–149.0)
VLDL: 25.4 mg/dL (ref 0.0–40.0)

## 2014-06-01 LAB — BASIC METABOLIC PANEL
BUN: 13 mg/dL (ref 6–23)
CALCIUM: 9.5 mg/dL (ref 8.4–10.5)
CO2: 28 mEq/L (ref 19–32)
CREATININE: 0.69 mg/dL (ref 0.40–1.20)
Chloride: 105 mEq/L (ref 96–112)
GFR: 88.74 mL/min (ref >60.00)
Glucose, Bld: 120 mg/dL — ABNORMAL HIGH (ref 70–99)
Potassium: 4.1 mEq/L (ref 3.5–5.1)
SODIUM: 138 meq/L (ref 135–145)

## 2014-06-01 LAB — CBC WITH DIFFERENTIAL/PLATELET
BASOS ABS: 0 10*3/uL (ref 0.0–0.1)
Basophils Relative: 0.5 % (ref 0.0–3.0)
Eosinophils Absolute: 0.1 10*3/uL (ref 0.0–0.7)
Eosinophils Relative: 1.9 % (ref 0.0–5.0)
HEMATOCRIT: 40.9 % (ref 36.0–46.0)
HEMOGLOBIN: 14 g/dL (ref 12.0–15.0)
Lymphocytes Relative: 22.6 % (ref 12.0–46.0)
Lymphs Abs: 1.5 10*3/uL (ref 0.7–4.0)
MCHC: 34.3 g/dL (ref 30.0–36.0)
MCV: 87.6 fl (ref 78.0–100.0)
Monocytes Absolute: 0.4 10*3/uL (ref 0.1–1.0)
Monocytes Relative: 5.8 % (ref 3.0–12.0)
Neutro Abs: 4.7 10*3/uL (ref 1.4–7.7)
Neutrophils Relative %: 69.2 % (ref 43.0–77.0)
Platelets: 253 10*3/uL (ref 150.0–400.0)
RBC: 4.67 Mil/uL (ref 3.87–5.11)
RDW: 13.6 % (ref 11.5–15.5)
WBC: 6.8 10*3/uL (ref 4.0–10.5)

## 2014-06-01 LAB — HEPATIC FUNCTION PANEL
ALT: 31 U/L (ref 0–35)
AST: 28 U/L (ref 0–37)
Albumin: 3.8 g/dL (ref 3.5–5.2)
Alkaline Phosphatase: 84 U/L (ref 39–117)
Bilirubin, Direct: 0.1 mg/dL (ref 0.0–0.3)
Total Bilirubin: 0.5 mg/dL (ref 0.2–1.2)
Total Protein: 6.9 g/dL (ref 6.0–8.3)

## 2014-06-01 LAB — HEMOGLOBIN A1C: Hgb A1c MFr Bld: 6.6 % — ABNORMAL HIGH (ref 4.6–6.5)

## 2014-06-05 ENCOUNTER — Encounter: Payer: Self-pay | Admitting: Internal Medicine

## 2014-06-05 ENCOUNTER — Ambulatory Visit (INDEPENDENT_AMBULATORY_CARE_PROVIDER_SITE_OTHER): Payer: Medicare Other | Admitting: Internal Medicine

## 2014-06-05 VITALS — BP 110/70 | HR 55 | Temp 98.2°F | Ht 60.0 in | Wt 191.2 lb

## 2014-06-05 DIAGNOSIS — E119 Type 2 diabetes mellitus without complications: Secondary | ICD-10-CM

## 2014-06-05 DIAGNOSIS — R7989 Other specified abnormal findings of blood chemistry: Secondary | ICD-10-CM

## 2014-06-05 DIAGNOSIS — E7800 Pure hypercholesterolemia, unspecified: Secondary | ICD-10-CM

## 2014-06-05 DIAGNOSIS — M8580 Other specified disorders of bone density and structure, unspecified site: Secondary | ICD-10-CM

## 2014-06-05 DIAGNOSIS — R1032 Left lower quadrant pain: Secondary | ICD-10-CM

## 2014-06-05 DIAGNOSIS — I10 Essential (primary) hypertension: Secondary | ICD-10-CM

## 2014-06-05 DIAGNOSIS — Z8669 Personal history of other diseases of the nervous system and sense organs: Secondary | ICD-10-CM

## 2014-06-05 DIAGNOSIS — E039 Hypothyroidism, unspecified: Secondary | ICD-10-CM | POA: Diagnosis not present

## 2014-06-05 DIAGNOSIS — E559 Vitamin D deficiency, unspecified: Secondary | ICD-10-CM

## 2014-06-05 DIAGNOSIS — Z0000 Encounter for general adult medical examination without abnormal findings: Secondary | ICD-10-CM

## 2014-06-05 DIAGNOSIS — R945 Abnormal results of liver function studies: Secondary | ICD-10-CM

## 2014-06-05 NOTE — Progress Notes (Signed)
Pre visit review using our clinic review tool, if applicable. No additional management support is needed unless otherwise documented below in the visit note. 

## 2014-06-05 NOTE — Progress Notes (Signed)
Patient ID: Veronica Branch, female   DOB: August 20, 1940, 74 y.o.   MRN: [id number]   Subjective:    Patient ID: Veronica Branch, female    DOB: February 24, 1940, 74 y.o.   MRN: [id number]  HPI  Patient here for a scheduled follow up.  She is trying to do better watching her diet.  Is more active.  No cardiac symptoms with increased activity or exertion. Had questions about her heart rate.  No light headedness or dizziness.  No syncope or near syncopal episodes.  Eating and drinking well.  Bowels stable. Some occasional LLQ discomfort.  Will feel bloated at times.  No significant pain.  No constipation or diarrhea.     Past Medical History  Diagnosis Date  . History of migraine headaches   . Hypothyroidism     s/p removal of right thyroid lobe and isthmus (1992)  . GERD (gastroesophageal reflux disease)   . Hiatal hernia   . Hypertension   . Hypercholesterolemia   . Hyperglycemia   . Diverticulosis     Current Outpatient Prescriptions on File Prior to Visit  Medication Sig Dispense Refill  . Blood Glucose Monitoring Suppl (ONE TOUCH ULTRA SYSTEM KIT) W/DEVICE KIT 1 kit by Does not apply route once. 1 each 0  . glucose blood test strip Use to test blood sugars 1-2 times daily 100 each 12  . Lancets (ONETOUCH ULTRASOFT) lancets Use to test blood sugars 1-2 times daily   E11.9 100 each 12  . lisinopril (PRINIVIL,ZESTRIL) 10 MG tablet TAKE 1 TABLET BY MOUTH EVERY DAY 90 tablet 1  . nystatin cream (MYCOSTATIN) Apply 1 application topically 2 (two) times daily. 30 g 0  . omeprazole (PRILOSEC) 20 MG capsule TAKE 1 CAPSULE BY MOUTH EVERY DAY 30 capsule 5  . pramoxine-hydrocortisone (ANALPRAM-HC) 1-1 % rectal cream Place 1 application rectally 2 (two) times daily. 30 g 0  . propranolol (INDERAL) 40 MG tablet TAKE 1 TABLET BY MOUTH TWICE A DAY 180 tablet 1  . SYNTHROID 100 MCG tablet TAKE 1 TABLET BY MOUTH EVERY DAY 90 tablet 3   No current facility-administered medications on file prior to visit.      Review of Systems  Constitutional: Negative for appetite change and unexpected weight change.  HENT: Negative for congestion and sinus pressure.   Respiratory: Negative for cough, chest tightness and shortness of breath.   Cardiovascular: Negative for chest pain, palpitations and leg swelling.  Gastrointestinal: Negative for nausea, vomiting and diarrhea.  Genitourinary: Negative for dysuria and difficulty urinating.  Skin: Negative for color change and rash.  Neurological: Negative for dizziness, light-headedness and headaches.  Hematological: Negative for adenopathy. Does not bruise/bleed easily.  Psychiatric/Behavioral: Negative for dysphoric mood and agitation.       Objective:     Blood pressure recheck:  12/68  Physical Exam  Constitutional: She appears well-developed and well-nourished. No distress.  HENT:  Nose: Nose normal.  Mouth/Throat: Oropharynx is clear and moist.  Neck: Neck supple. No thyromegaly present.  Cardiovascular: Normal rate and regular rhythm.   Pulmonary/Chest: Breath sounds normal. No respiratory distress. She has no wheezes.  Abdominal: Soft. Bowel sounds are normal. There is no tenderness.  Musculoskeletal: She exhibits no edema or tenderness.  Lymphadenopathy:    She has no cervical adenopathy.  Skin: No rash noted. No erythema.  Psychiatric: She has a normal mood and affect. Her behavior is normal.    BP 110/70 mmHg  Pulse 55  Temp(Src) 98.2 F (36.8 C) (  Oral)  Ht 5' (1.524 m)  Wt 191 lb 4 oz (86.75 kg)  BMI 37.35 kg/m2  SpO2 96% Wt Readings from Last 3 Encounters:  06/05/14 191 lb 4 oz (86.75 kg)  03/29/14 189 lb 8 oz (85.957 kg)  02/06/14 190 lb 12 oz (86.524 kg)     Lab Results  Component Value Date   WBC 6.8 06/01/2014   HGB 14.0 06/01/2014   HCT 40.9 06/01/2014   PLT 253.0 06/01/2014   GLUCOSE 120* 06/01/2014   CHOL 190 06/01/2014   TRIG 127.0 06/01/2014   HDL 45.00 06/01/2014   LDLDIRECT 156.0 12/06/2012   LDLCALC  120* 06/01/2014   ALT 31 06/01/2014   AST 28 06/01/2014   NA 138 06/01/2014   K 4.1 06/01/2014   CL 105 06/01/2014   CREATININE 0.69 06/01/2014   BUN 13 06/01/2014   CO2 28 06/01/2014   TSH 2.93 02/20/2014   HGBA1C 6.6* 06/01/2014   MICROALBUR 1.0 02/20/2014       Assessment & Plan:   Problem List Items Addressed This Visit    Abnormal liver function test    Abdominal ultrasound revealed fatty liver.  Most recent check wnl.  Follow liver function tests.  Diet, exercise and weight loss.        Relevant Orders   Hepatic function panel   Diabetes    Recent a1c improved - a1c 6.6.  Continue low carb diet.  Exercise.  Follow.       Relevant Medications   aspirin 81 MG tablet   Other Relevant Orders   Hemoglobin A1c   Health care maintenance    Physical 10/06/13.  Colonoscopy 09/04/12 - diverticulosis.  Mammogram 10/11/13 - Birads II.        History of migraine headaches    Has done well on current regimen.  Overall headaches have improved.  Will continue current regimen.  Follow.       Hypercholesterolemia    Low cholesterol diet and exercise.  Follow lipid panel.   Lab Results  Component Value Date   CHOL 190 06/01/2014   HDL 45.00 06/01/2014   LDLCALC 120* 06/01/2014   LDLDIRECT 156.0 12/06/2012   TRIG 127.0 06/01/2014   CHOLHDL 4 06/01/2014        Relevant Medications   aspirin 81 MG tablet   Other Relevant Orders   Lipid panel   Hypertension - Primary    Blood pressure doing well.  Same medication regimen.  Follow pressures.  Follow metabolic panel.       Relevant Medications   aspirin 81 MG tablet   Other Relevant Orders   Basic metabolic panel   Hypothyroidism    On synthroid.  Follow tsh.       LLQ pain    Occurs intermittently.  Some bloating.  Overall GI issues better.  Discussed further w/up.  She wants to monitor for now.  Will notify me if she changes her mind or ir persistent.        Osteopenia    Declines further treatment.  Continue  vitamin D and weight bearing exercise.       Vitamin D deficiency    Continue replacement.  Follow vitamin D level.         I spent 25 minutes with the patient and more than 50% of the time was spent in consultation regarding the above.     Einar Pheasant, MD

## 2014-06-12 ENCOUNTER — Encounter: Payer: Self-pay | Admitting: Internal Medicine

## 2014-06-12 DIAGNOSIS — Z0000 Encounter for general adult medical examination without abnormal findings: Secondary | ICD-10-CM | POA: Insufficient documentation

## 2014-06-12 DIAGNOSIS — R1032 Left lower quadrant pain: Secondary | ICD-10-CM | POA: Insufficient documentation

## 2014-06-12 NOTE — Assessment & Plan Note (Signed)
Blood pressure doing well.  Same medication regimen.  Follow pressures.  Follow metabolic panel.   

## 2014-06-12 NOTE — Assessment & Plan Note (Signed)
On synthroid.  Follow tsh.   

## 2014-06-12 NOTE — Assessment & Plan Note (Signed)
Continue replacement.  Follow vitamin D level.  

## 2014-06-12 NOTE — Assessment & Plan Note (Signed)
Has done well on current regimen.  Overall headaches have improved.  Will continue current regimen.  Follow.

## 2014-06-12 NOTE — Assessment & Plan Note (Signed)
Declines further treatment.  Continue vitamin D and weight bearing exercise.

## 2014-06-12 NOTE — Assessment & Plan Note (Signed)
Abdominal ultrasound revealed fatty liver.  Most recent check wnl.  Follow liver function tests.  Diet, exercise and weight loss.

## 2014-06-12 NOTE — Assessment & Plan Note (Signed)
Recent a1c improved - a1c 6.6.  Continue low carb diet.  Exercise.  Follow.

## 2014-06-12 NOTE — Assessment & Plan Note (Signed)
Occurs intermittently.  Some bloating.  Overall GI issues better.  Discussed further w/up.  She wants to monitor for now.  Will notify me if she changes her mind or ir persistent.

## 2014-06-12 NOTE — Assessment & Plan Note (Signed)
Low cholesterol diet and exercise.  Follow lipid panel.   Lab Results  Component Value Date   CHOL 190 06/01/2014   HDL 45.00 06/01/2014   LDLCALC 120* 06/01/2014   LDLDIRECT 156.0 12/06/2012   TRIG 127.0 06/01/2014   CHOLHDL 4 06/01/2014

## 2014-06-12 NOTE — Assessment & Plan Note (Signed)
Physical 10/06/13.  Colonoscopy 09/04/12 - diverticulosis.  Mammogram 10/11/13 - Birads II.

## 2014-08-04 ENCOUNTER — Other Ambulatory Visit: Payer: Self-pay | Admitting: Internal Medicine

## 2014-08-24 ENCOUNTER — Other Ambulatory Visit: Payer: Self-pay | Admitting: Internal Medicine

## 2014-10-04 ENCOUNTER — Other Ambulatory Visit (INDEPENDENT_AMBULATORY_CARE_PROVIDER_SITE_OTHER): Payer: Medicare Other

## 2014-10-04 DIAGNOSIS — R945 Abnormal results of liver function studies: Secondary | ICD-10-CM

## 2014-10-04 DIAGNOSIS — I10 Essential (primary) hypertension: Secondary | ICD-10-CM

## 2014-10-04 DIAGNOSIS — E7800 Pure hypercholesterolemia, unspecified: Secondary | ICD-10-CM

## 2014-10-04 DIAGNOSIS — E119 Type 2 diabetes mellitus without complications: Secondary | ICD-10-CM

## 2014-10-04 DIAGNOSIS — R7989 Other specified abnormal findings of blood chemistry: Secondary | ICD-10-CM | POA: Diagnosis not present

## 2014-10-04 LAB — BASIC METABOLIC PANEL
BUN: 14 mg/dL (ref 6–23)
CALCIUM: 9 mg/dL (ref 8.4–10.5)
CO2: 26 mEq/L (ref 19–32)
Chloride: 107 mEq/L (ref 96–112)
Creatinine, Ser: 0.67 mg/dL (ref 0.40–1.20)
GFR: 91.71 mL/min (ref >60.00)
GLUCOSE: 111 mg/dL — AB (ref 70–99)
POTASSIUM: 3.9 meq/L (ref 3.5–5.1)
SODIUM: 141 meq/L (ref 135–145)

## 2014-10-04 LAB — LIPID PANEL
CHOL/HDL RATIO: 4
CHOLESTEROL: 179 mg/dL (ref 0–200)
HDL: 45.9 mg/dL (ref >39.00)
LDL CALC: 110 mg/dL — AB (ref 0–99)
NonHDL: 133.05
TRIGLYCERIDES: 116 mg/dL (ref 0.0–149.0)
VLDL: 23.2 mg/dL (ref 0.0–40.0)

## 2014-10-04 LAB — HEPATIC FUNCTION PANEL
ALBUMIN: 3.9 g/dL (ref 3.5–5.2)
ALT: 20 U/L (ref 0–35)
AST: 16 U/L (ref 0–37)
Alkaline Phosphatase: 84 U/L (ref 39–117)
Bilirubin, Direct: 0.1 mg/dL (ref 0.0–0.3)
TOTAL PROTEIN: 6.7 g/dL (ref 6.0–8.3)
Total Bilirubin: 0.5 mg/dL (ref 0.2–1.2)

## 2014-10-04 LAB — HEMOGLOBIN A1C: Hgb A1c MFr Bld: 6.6 % — ABNORMAL HIGH (ref 4.6–6.5)

## 2014-10-09 ENCOUNTER — Ambulatory Visit (INDEPENDENT_AMBULATORY_CARE_PROVIDER_SITE_OTHER): Payer: Medicare Other | Admitting: Internal Medicine

## 2014-10-09 ENCOUNTER — Encounter: Payer: Self-pay | Admitting: Internal Medicine

## 2014-10-09 VITALS — BP 120/80 | HR 58 | Temp 98.6°F | Resp 18 | Ht 60.0 in | Wt 190.5 lb

## 2014-10-09 DIAGNOSIS — Z23 Encounter for immunization: Secondary | ICD-10-CM

## 2014-10-09 DIAGNOSIS — E119 Type 2 diabetes mellitus without complications: Secondary | ICD-10-CM | POA: Diagnosis not present

## 2014-10-09 DIAGNOSIS — M8580 Other specified disorders of bone density and structure, unspecified site: Secondary | ICD-10-CM | POA: Diagnosis not present

## 2014-10-09 DIAGNOSIS — Z1239 Encounter for other screening for malignant neoplasm of breast: Secondary | ICD-10-CM

## 2014-10-09 DIAGNOSIS — E039 Hypothyroidism, unspecified: Secondary | ICD-10-CM

## 2014-10-09 DIAGNOSIS — E559 Vitamin D deficiency, unspecified: Secondary | ICD-10-CM

## 2014-10-09 DIAGNOSIS — I10 Essential (primary) hypertension: Secondary | ICD-10-CM | POA: Diagnosis not present

## 2014-10-09 DIAGNOSIS — E669 Obesity, unspecified: Secondary | ICD-10-CM

## 2014-10-09 DIAGNOSIS — E7800 Pure hypercholesterolemia, unspecified: Secondary | ICD-10-CM

## 2014-10-09 DIAGNOSIS — R7989 Other specified abnormal findings of blood chemistry: Secondary | ICD-10-CM

## 2014-10-09 DIAGNOSIS — R945 Abnormal results of liver function studies: Secondary | ICD-10-CM

## 2014-10-09 NOTE — Progress Notes (Signed)
Pre-visit discussion using our clinic review tool. No additional management support is needed unless otherwise documented below in the visit note.  

## 2014-10-09 NOTE — Progress Notes (Signed)
Patient ID: Veronica Branch, female   DOB: 10/26/1940, 73 y.o.   MRN: [id number]   Subjective:    Patient ID: Veronica Branch, female    DOB: 12/09/1940, 73 y.o.   MRN: [id number]  HPI  Patient here to follow up on her diabetes, cholesterol and her blood pressure.  She is trying to watch what she eats.  Sugars have been better.  She is not checking them regularly.  Not exercising - secondary to heat.  Plans to get more active.  Is trying to stay active.  No cardiac symptoms with increased activity or exertion.  No sob.  Acid reflux controlled if she takes her omeprazole qod.  No abdominal pain or cramping.  Bowels stable.     Past Medical History  Diagnosis Date  . History of migraine headaches   . Hypothyroidism     s/p removal of right thyroid lobe and isthmus (1992)  . GERD (gastroesophageal reflux disease)   . Hiatal hernia   . Hypertension   . Hypercholesterolemia   . Hyperglycemia   . Diverticulosis    Past Surgical History  Procedure Laterality Date  . Thyroid lobectomy  1992    s/p removal of right thyroid lobe and isthmus   Family History  Problem Relation Age of Onset  . Coronary artery disease Mother     myocardial infarction and CHF  . Breast cancer Mother   . Breast cancer Maternal Aunt     x 2  . Colon cancer Neg Hx    Social History   Social History  . Marital Status: Married    Spouse Name: N/A  . Number of Children: 2  . Years of Education: N/A   Occupational History  . retired teacher    Social History Main Topics  . Smoking status: Never Smoker   . Smokeless tobacco: Never Used  . Alcohol Use: No  . Drug Use: No  . Sexual Activity: Not Asked   Other Topics Concern  . None   Social History Narrative   Regularly exercises. Retired and married.     Outpatient Encounter Prescriptions as of 10/09/2014  Medication Sig  . aspirin 81 MG tablet Take 81 mg by mouth daily.  . Blood Glucose Monitoring Suppl (ONE TOUCH ULTRA SYSTEM KIT) W/DEVICE  KIT 1 kit by Does not apply route once.  . glucose blood test strip Use to test blood sugars 1-2 times daily  . Lancets (ONETOUCH ULTRASOFT) lancets Use to test blood sugars 1-2 times daily   E11.9  . lisinopril (PRINIVIL,ZESTRIL) 10 MG tablet TAKE 1 TABLET BY MOUTH EVERY DAY  . omeprazole (PRILOSEC) 20 MG capsule TAKE 1 CAPSULE BY MOUTH EVERY DAY  . propranolol (INDERAL) 40 MG tablet TAKE 1 TABLET BY MOUTH TWICE A DAY  . SYNTHROID 100 MCG tablet TAKE 1 TABLET BY MOUTH EVERY DAY  . [DISCONTINUED] nystatin cream (MYCOSTATIN) Apply 1 application topically 2 (two) times daily.  . [DISCONTINUED] pramoxine-hydrocortisone (ANALPRAM-HC) 1-1 % rectal cream Place 1 application rectally 2 (two) times daily.  . [DISCONTINUED] propranolol (INDERAL) 40 MG tablet TAKE 1 TABLET BY MOUTH TWICE A DAY   No facility-administered encounter medications on file as of 10/09/2014.    Review of Systems  Constitutional: Negative for appetite change and unexpected weight change.  HENT: Negative for congestion and sinus pressure.   Respiratory: Negative for cough, chest tightness and shortness of breath.   Cardiovascular: Negative for chest pain, palpitations and leg swelling.  Gastrointestinal: Negative for   nausea, vomiting, abdominal pain and diarrhea.  Genitourinary: Negative for dysuria and difficulty urinating.  Musculoskeletal: Negative for back pain and joint swelling.  Skin: Negative for color change and rash.  Neurological: Negative for dizziness, light-headedness and headaches.  Psychiatric/Behavioral: Negative for dysphoric mood and agitation.       Objective:     Blood pressure rechecked by me:  128/80-82  Physical Exam  Constitutional: She appears well-developed and well-nourished. No distress.  HENT:  Nose: Nose normal.  Mouth/Throat: Oropharynx is clear and moist.  Eyes: Conjunctivae are normal. Right eye exhibits no discharge. Left eye exhibits no discharge.  Neck: Neck supple. No  thyromegaly present.  Cardiovascular: Normal rate and regular rhythm.   Pulmonary/Chest: Breath sounds normal. No respiratory distress. She has no wheezes.  Abdominal: Soft. Bowel sounds are normal. There is no tenderness.  Musculoskeletal: She exhibits no edema or tenderness.  Lymphadenopathy:    She has no cervical adenopathy.  Skin: No rash noted. No erythema.  Psychiatric: She has a normal mood and affect. Her behavior is normal.    BP 120/80 mmHg  Pulse 58  Temp(Src) 98.6 F (37 C) (Oral)  Resp 18  Ht 5' (1.524 m)  Wt 190 lb 8 oz (86.41 kg)  BMI 37.20 kg/m2  SpO2 97% Wt Readings from Last 3 Encounters:  10/09/14 190 lb 8 oz (86.41 kg)  06/05/14 191 lb 4 oz (86.75 kg)  03/29/14 189 lb 8 oz (85.957 kg)     Lab Results  Component Value Date   WBC 6.8 06/01/2014   HGB 14.0 06/01/2014   HCT 40.9 06/01/2014   PLT 253.0 06/01/2014   GLUCOSE 111* 10/04/2014   CHOL 179 10/04/2014   TRIG 116.0 10/04/2014   HDL 45.90 10/04/2014   LDLDIRECT 156.0 12/06/2012   LDLCALC 110* 10/04/2014   ALT 20 10/04/2014   AST 16 10/04/2014   NA 141 10/04/2014   K 3.9 10/04/2014   CL 107 10/04/2014   CREATININE 0.67 10/04/2014   BUN 14 10/04/2014   CO2 26 10/04/2014   TSH 2.93 02/20/2014   HGBA1C 6.6* 10/04/2014   MICROALBUR 1.0 02/20/2014       Assessment & Plan:   Problem List Items Addressed This Visit    Abnormal liver function test    Abdominal ultrasound revealed fatty liver.  Liver panel just checked - wnl.  Follow.  Continue diet, exercise and weight loss.        Diabetes    Sugars have been doing better.  Trying to watch her diet.  Low carb diet and exercise.  Follow met b and a1c.   Lab Results  Component Value Date   HGBA1C 6.6* 10/04/2014        Relevant Orders   Hemoglobin A1c   Hypercholesterolemia    On no cholesterol medication.  Low cholesterol diet and exercise.  Follow lipid panel.   Lab Results  Component Value Date   CHOL 179 10/04/2014   HDL  45.90 10/04/2014   LDLCALC 110* 10/04/2014   LDLDIRECT 156.0 12/06/2012   TRIG 116.0 10/04/2014   CHOLHDL 4 10/04/2014        Relevant Orders   Lipid panel   Hepatic function panel   Hypertension    Blood pressure under good control.  Continue same medication regimen.  Follow pressures.  Follow metabolic panel.        Relevant Orders   Basic metabolic panel   Hypothyroidism    On thyroid replacement.  Follow tsh.         Relevant Orders   TSH   Obesity (BMI 30-39.9)    Diet and exercise.  Follow.        Osteopenia    Has declined further treatment.  Continue vitamin D supplements and weight bearing exercise.  Follow.        Vitamin D deficiency    Recheck vitamin D level with next labs.       Relevant Orders   Vit D  25 hydroxy (rtn osteoporosis monitoring)    Other Visit Diagnoses    Screening breast examination    -  Primary    Relevant Orders    MM DIGITAL SCREENING BILATERAL    Encounter for immunization            SCOTT, CHARLENE, MD  

## 2014-10-10 ENCOUNTER — Encounter: Payer: Self-pay | Admitting: Internal Medicine

## 2014-10-10 NOTE — Assessment & Plan Note (Signed)
Abdominal ultrasound revealed fatty liver.  Liver panel just checked - wnl.  Follow.  Continue diet, exercise and weight loss.

## 2014-10-10 NOTE — Assessment & Plan Note (Signed)
On thyroid replacement.  Follow tsh.  

## 2014-10-10 NOTE — Assessment & Plan Note (Signed)
Blood pressure under good control.  Continue same medication regimen.  Follow pressures.  Follow metabolic panel.   

## 2014-10-10 NOTE — Assessment & Plan Note (Signed)
Recheck vitamin D level with next labs.  

## 2014-10-10 NOTE — Assessment & Plan Note (Signed)
Diet and exercise.  Follow.  

## 2014-10-10 NOTE — Assessment & Plan Note (Signed)
Has declined further treatment.  Continue vitamin D supplements and weight bearing exercise.  Follow.

## 2014-10-10 NOTE — Assessment & Plan Note (Signed)
On no cholesterol medication.  Low cholesterol diet and exercise.  Follow lipid panel.   Lab Results  Component Value Date   CHOL 179 10/04/2014   HDL 45.90 10/04/2014   LDLCALC 110* 10/04/2014   LDLDIRECT 156.0 12/06/2012   TRIG 116.0 10/04/2014   CHOLHDL 4 10/04/2014

## 2014-10-10 NOTE — Assessment & Plan Note (Signed)
Sugars have been doing better.  Trying to watch her diet.  Low carb diet and exercise.  Follow met b and a1c.   Lab Results  Component Value Date   HGBA1C 6.6* 10/04/2014

## 2014-10-18 ENCOUNTER — Ambulatory Visit: Payer: Medicare Other

## 2014-10-24 ENCOUNTER — Ambulatory Visit
Admission: RE | Admit: 2014-10-24 | Discharge: 2014-10-24 | Disposition: A | Discharge status: Home / Self Care | Payer: Medicare Other | Source: Ambulatory Visit | Attending: Internal Medicine | Admitting: Internal Medicine

## 2014-10-24 ENCOUNTER — Other Ambulatory Visit: Payer: Self-pay | Admitting: Internal Medicine

## 2014-10-24 DIAGNOSIS — Z1231 Encounter for screening mammogram for malignant neoplasm of breast: Secondary | ICD-10-CM | POA: Insufficient documentation

## 2014-10-24 DIAGNOSIS — Z1239 Encounter for other screening for malignant neoplasm of breast: Secondary | ICD-10-CM

## 2014-11-03 ENCOUNTER — Telehealth: Payer: Self-pay

## 2014-11-03 NOTE — Telephone Encounter (Signed)
Pt called triage line and wanted to know if her mammogram from last week was in 3D. It was and she wanted to know if Dr Lorin PicketScott ordered it and if it would be completely covered by insurance. The Rad tech told it would be, but she was told something different by her sister or friend. Was informed by Qu[place]en [place] that [facility] said that the pt had to specifically ask for it when they went in for their mammogram. Informed her she should probably call [place] or her insurance company and see how they billed it and how much if anything she would be billed.

## 2014-11-23 ENCOUNTER — Other Ambulatory Visit: Payer: Self-pay | Admitting: Internal Medicine

## 2014-12-04 ENCOUNTER — Other Ambulatory Visit: Payer: Self-pay

## 2014-12-04 MED ORDER — OMEPRAZOLE 20 MG PO CPDR: DELAYED_RELEASE_CAPSULE | ORAL | Status: DC

## 2014-12-05 ENCOUNTER — Other Ambulatory Visit: Payer: Self-pay

## 2015-01-30 ENCOUNTER — Other Ambulatory Visit: Payer: Self-pay | Admitting: Internal Medicine

## 2015-02-06 ENCOUNTER — Other Ambulatory Visit (INDEPENDENT_AMBULATORY_CARE_PROVIDER_SITE_OTHER): Payer: Medicare Other

## 2015-02-06 DIAGNOSIS — E7800 Pure hypercholesterolemia, unspecified: Secondary | ICD-10-CM | POA: Diagnosis not present

## 2015-02-06 DIAGNOSIS — E119 Type 2 diabetes mellitus without complications: Secondary | ICD-10-CM | POA: Diagnosis not present

## 2015-02-06 DIAGNOSIS — E559 Vitamin D deficiency, unspecified: Secondary | ICD-10-CM

## 2015-02-06 DIAGNOSIS — I10 Essential (primary) hypertension: Secondary | ICD-10-CM | POA: Diagnosis not present

## 2015-02-06 DIAGNOSIS — E039 Hypothyroidism, unspecified: Secondary | ICD-10-CM

## 2015-02-06 LAB — LIPID PANEL
Cholesterol: 177 mg/dL (ref 0–200)
HDL: 47 mg/dL (ref >39.00)
LDL Cholesterol: 112 mg/dL — ABNORMAL HIGH (ref 0–99)
NONHDL: 130.37
TRIGLYCERIDES: 93 mg/dL (ref 0.0–149.0)
Total CHOL/HDL Ratio: 4
VLDL: 18.6 mg/dL (ref 0.0–40.0)

## 2015-02-06 LAB — HEPATIC FUNCTION PANEL
ALBUMIN: 4 g/dL (ref 3.5–5.2)
ALT: 16 U/L (ref 0–35)
AST: 14 U/L (ref 0–37)
Alkaline Phosphatase: 89 U/L (ref 39–117)
Bilirubin, Direct: 0.1 mg/dL (ref 0.0–0.3)
TOTAL PROTEIN: 7.1 g/dL (ref 6.0–8.3)
Total Bilirubin: 0.5 mg/dL (ref 0.2–1.2)

## 2015-02-06 LAB — BASIC METABOLIC PANEL
BUN: 11 mg/dL (ref 6–23)
CALCIUM: 9.2 mg/dL (ref 8.4–10.5)
CO2: 28 mEq/L (ref 19–32)
Chloride: 107 mEq/L (ref 96–112)
Creatinine, Ser: 0.75 mg/dL (ref 0.40–1.20)
GFR: 80.44 mL/min (ref >60.00)
GLUCOSE: 120 mg/dL — AB (ref 70–99)
Potassium: 4.2 mEq/L (ref 3.5–5.1)
Sodium: 142 mEq/L (ref 135–145)

## 2015-02-06 LAB — HEMOGLOBIN A1C: Hgb A1c MFr Bld: 6.7 % — ABNORMAL HIGH (ref 4.6–6.5)

## 2015-02-06 LAB — VITAMIN D 25 HYDROXY (VIT D DEFICIENCY, FRACTURES): VITD: 20.65 ng/mL — AB (ref 30.00–100.00)

## 2015-02-06 LAB — TSH: TSH: 2.05 u[IU]/mL (ref 0.35–4.50)

## 2015-02-08 ENCOUNTER — Ambulatory Visit (INDEPENDENT_AMBULATORY_CARE_PROVIDER_SITE_OTHER): Payer: Medicare Other | Admitting: Internal Medicine

## 2015-02-08 ENCOUNTER — Encounter: Payer: Self-pay | Admitting: Internal Medicine

## 2015-02-08 VITALS — BP 120/70 | HR 68 | Temp 98.2°F | Resp 18 | Ht 59.0 in | Wt 191.0 lb

## 2015-02-08 DIAGNOSIS — E119 Type 2 diabetes mellitus without complications: Secondary | ICD-10-CM

## 2015-02-08 DIAGNOSIS — Z0000 Encounter for general adult medical examination without abnormal findings: Secondary | ICD-10-CM

## 2015-02-08 DIAGNOSIS — R945 Abnormal results of liver function studies: Secondary | ICD-10-CM

## 2015-02-08 DIAGNOSIS — E559 Vitamin D deficiency, unspecified: Secondary | ICD-10-CM

## 2015-02-08 DIAGNOSIS — E669 Obesity, unspecified: Secondary | ICD-10-CM

## 2015-02-08 DIAGNOSIS — I10 Essential (primary) hypertension: Secondary | ICD-10-CM

## 2015-02-08 DIAGNOSIS — E039 Hypothyroidism, unspecified: Secondary | ICD-10-CM | POA: Diagnosis not present

## 2015-02-08 DIAGNOSIS — R7989 Other specified abnormal findings of blood chemistry: Secondary | ICD-10-CM

## 2015-02-08 DIAGNOSIS — E7800 Pure hypercholesterolemia, unspecified: Secondary | ICD-10-CM

## 2015-02-08 NOTE — Progress Notes (Signed)
Patient ID: Veronica Branch, female   DOB: 12/03/1940, 75 y.o.   MRN: [id number]   Subjective:    Patient ID: Veronica Branch, female    DOB: 05-16-1940, 75 y.o.   MRN: [id number]  HPI  Patient with past history of migraine headaches, GERD, hypothyroidism, hypercholesterolemia, diabetes and hypertension.  She comes in today to follow up on these issues as well as for a complete physical exam.  She reports she is doing relatively well.  We discussed diet and exercise.  She does try to watch her diet.  No exercising regularly.  Was walking more.  Plans to restart.  No chest pain or tightness.  No sob.  No acid reflux.  No abdominal pain or cramping.  Bowels stable.     Past Medical History  Diagnosis Date  . History of migraine headaches   . Hypothyroidism     s/p removal of right thyroid lobe and isthmus (1992)  . GERD (gastroesophageal reflux disease)   . Hiatal hernia   . Hypertension   . Hypercholesterolemia   . Hyperglycemia   . Diverticulosis    Past Surgical History  Procedure Laterality Date  . Thyroid lobectomy  1992    s/p removal of right thyroid lobe and isthmus  . Breast biopsy Right 2000    benign   Family History  Problem Relation Age of Onset  . Coronary artery disease Mother     myocardial infarction and CHF  . Breast cancer Mother     33's  . Breast cancer Maternal Aunt     x 2  . Colon cancer Neg Hx   . Breast cancer Sister   . Breast cancer Cousin    Social History   Social History  . Marital Status: Married    Spouse Name: N/A  . Number of Children: 2  . Years of Education: N/A   Occupational History  . retired Pharmacist, hospital    Social History Main Topics  . Smoking status: Never Smoker   . Smokeless tobacco: Never Used  . Alcohol Use: No  . Drug Use: No  . Sexual Activity: Not Asked   Other Topics Concern  . None   Social History Narrative   Regularly exercises. Retired and married.     Outpatient Encounter Prescriptions as of 02/08/2015   Medication Sig  . aspirin 81 MG tablet Take 81 mg by mouth daily.  . Blood Glucose Monitoring Suppl (ONE TOUCH ULTRA SYSTEM KIT) W/DEVICE KIT 1 kit by Does not apply route once.  Marland Kitchen glucose blood test strip Use to test blood sugars 1-2 times daily  . Lancets (ONETOUCH ULTRASOFT) lancets Use to test blood sugars 1-2 times daily   E11.9  . lisinopril (PRINIVIL,ZESTRIL) 10 MG tablet TAKE 1 TABLET BY MOUTH EVERY DAY  . omeprazole (PRILOSEC) 20 MG capsule TAKE 1 CAPSULE BY MOUTH EVERY DAY  . propranolol (INDERAL) 40 MG tablet TAKE 1 TABLET BY MOUTH TWICE A DAY  . SYNTHROID 100 MCG tablet TAKE 1 TABLET BY MOUTH EVERY DAY   No facility-administered encounter medications on file as of 02/08/2015.    Review of Systems  Constitutional: Negative for appetite change and unexpected weight change.  HENT: Negative for congestion and sinus pressure.   Eyes: Negative for pain and visual disturbance.  Respiratory: Negative for cough, chest tightness and shortness of breath.   Cardiovascular: Negative for chest pain, palpitations and leg swelling.  Gastrointestinal: Negative for nausea, vomiting, abdominal pain and diarrhea.  Genitourinary: Negative  for dysuria and difficulty urinating.  Musculoskeletal: Negative for back pain and joint swelling.  Skin: Negative for color change and rash.  Neurological: Negative for dizziness, light-headedness and headaches.  Hematological: Negative for adenopathy. Does not bruise/bleed easily.  Psychiatric/Behavioral: Negative for dysphoric mood and agitation.       Objective:    Physical Exam  Constitutional: She is oriented to person, place, and time. She appears well-developed and well-nourished. No distress.  HENT:  Nose: Nose normal.  Mouth/Throat: Oropharynx is clear and moist.  Eyes: Right eye exhibits no discharge. Left eye exhibits no discharge. No scleral icterus.  Neck: Neck supple. No thyromegaly present.  Cardiovascular: Normal rate and regular  rhythm.   Pulmonary/Chest: Breath sounds normal. No accessory muscle usage. No tachypnea. No respiratory distress. She has no decreased breath sounds. She has no wheezes. She has no rhonchi. Right breast exhibits no inverted nipple, no mass, no nipple discharge and no tenderness (no axillary adenopathy). Left breast exhibits no inverted nipple, no mass, no nipple discharge and no tenderness (no axilarry adenopathy).  Abdominal: Soft. Bowel sounds are normal. There is no tenderness.  Musculoskeletal: She exhibits no edema or tenderness.  Lymphadenopathy:    She has no cervical adenopathy.  Neurological: She is alert and oriented to person, place, and time.  Skin: Skin is warm. No rash noted. No erythema.  Psychiatric: She has a normal mood and affect. Her behavior is normal.    BP 120/70 mmHg  Pulse 68  Temp(Src) 98.2 F (36.8 C) (Oral)  Resp 18  Ht _0  (1.499 m)  Wt 191 lb (86.637 kg)  BMI 38.56 kg/m2  SpO2 95% Wt Readings from Last 3 Encounters:  02/08/15 191 lb (86.637 kg)  10/09/14 190 lb 8 oz (86.41 kg)  06/05/14 191 lb 4 oz (86.75 kg)     Lab Results  Component Value Date   WBC 6.8 06/01/2014   HGB 14.0 06/01/2014   HCT 40.9 06/01/2014   PLT 253.0 06/01/2014   GLUCOSE 120* 02/06/2015   CHOL 177 02/06/2015   TRIG 93.0 02/06/2015   HDL 47.00 02/06/2015   LDLDIRECT 156.0 12/06/2012   LDLCALC 112* 02/06/2015   ALT 16 02/06/2015   AST 14 02/06/2015   NA 142 02/06/2015   K 4.2 02/06/2015   CL 107 02/06/2015   CREATININE 0.75 02/06/2015   BUN 11 02/06/2015   CO2 28 02/06/2015   TSH 2.05 02/06/2015   HGBA1C 6.7* 02/06/2015   MICROALBUR 1.0 02/20/2014    Mm Screening Breast Tomo Bilateral  10/24/2014  CLINICAL DATA:  Screening. EXAM: DIGITAL SCREENING BILATERAL MAMMOGRAM WITH 3D TOMO WITH CAD COMPARISON:  Previous exam(s). ACR Breast Density Category b: There are scattered areas of fibroglandular density. FINDINGS: There are no findings suspicious for malignancy.  Images were processed with CAD. IMPRESSION: No mammographic evidence of malignancy. A result letter of this screening mammogram will be mailed directly to the patient. RECOMMENDATION: Screening mammogram in one year. (Code:SM-B-01Y) BI-RADS CATEGORY  1: Negative. Electronically Signed   By: Franki Cabot M.D.   On: 10/24/2014 15:18       Assessment & Plan:   Problem List Items Addressed This Visit    Abnormal liver function test    Abdominal ultrasound revealed fatty liver.  Diet and exercise and weight loss.  Recent liver panel wnl.        Diabetes ([place])    Follow low carb diet.  Discussed exercise.  Follow met b and a1c.   Lab  Results  Component Value Date   HGBA1C 6.7* 02/06/2015        Relevant Orders   Hemoglobin A1c   Microalbumin / creatinine urine ratio   Health care maintenance    Physical today 02/08/15.  Mammogram 10/24/14 - Birads I.  Colonoscopy 09/04/12.        Hypercholesterolemia    Low cholesterol diet and exercise.  Follow lipid panel.   Lab Results  Component Value Date   CHOL 177 02/06/2015   HDL 47.00 02/06/2015   LDLCALC 112* 02/06/2015   LDLDIRECT 156.0 12/06/2012   TRIG 93.0 02/06/2015   CHOLHDL 4 02/06/2015        Relevant Orders   Lipid panel   Hepatic function panel   Hypertension    Blood pressure under good control.  Continue same medication regimen.  Follow pressures.  Follow metabolic panel.        Relevant Orders   CBC with Differential/Platelet   Basic metabolic panel   Hypothyroidism    On thyroid replacement.  Follow tsh.        Obesity (BMI 30-39.9)    Discussed diet and exercise.  Follow.        Vitamin D deficiency    Follow vitamin D level.  Recent check - low.  Replace as directed.        Other Visit Diagnoses    Routine general medical examination at a health care facility    -  Primary        Einar Pheasant, MD

## 2015-02-08 NOTE — Patient Instructions (Signed)
nasacort nasal spray - 2 sprays each nostril one time per day.  Do this in the evening.    Saline nasal spray - flush nose at least 2-3x/day 

## 2015-02-08 NOTE — Progress Notes (Signed)
Pre-visit discussion using our clinic review tool. No additional management support is needed unless otherwise documented below in the visit note.  

## 2015-02-11 ENCOUNTER — Encounter: Payer: Self-pay | Admitting: Internal Medicine

## 2015-02-11 NOTE — Assessment & Plan Note (Addendum)
Follow vitamin D level.  Recent check - low.  Replace as directed.

## 2015-02-11 NOTE — Assessment & Plan Note (Signed)
Physical today 02/08/15.  Mammogram 10/24/14 - Birads I.  Colonoscopy 09/04/12.

## 2015-02-11 NOTE — Assessment & Plan Note (Signed)
On thyroid replacement.  Follow tsh.  

## 2015-02-11 NOTE — Assessment & Plan Note (Signed)
Abdominal ultrasound revealed fatty liver.  Diet and exercise and weight loss.  Recent liver panel wnl.

## 2015-02-11 NOTE — Assessment & Plan Note (Signed)
Discussed diet and exercise.  Follow.  

## 2015-02-11 NOTE — Assessment & Plan Note (Signed)
Follow low carb diet.  Discussed exercise.  Follow met b and a1c.   Lab Results  Component Value Date   HGBA1C 6.7* 02/06/2015

## 2015-02-11 NOTE — Assessment & Plan Note (Signed)
Low cholesterol diet and exercise.  Follow lipid panel.   Lab Results  Component Value Date   CHOL 177 02/06/2015   HDL 47.00 02/06/2015   LDLCALC 112* 02/06/2015   LDLDIRECT 156.0 12/06/2012   TRIG 93.0 02/06/2015   CHOLHDL 4 02/06/2015

## 2015-02-11 NOTE — Assessment & Plan Note (Signed)
Blood pressure under good control.  Continue same medication regimen.  Follow pressures.  Follow metabolic panel.   

## 2015-05-01 ENCOUNTER — Other Ambulatory Visit: Payer: Self-pay | Admitting: Internal Medicine

## 2015-05-01 NOTE — Telephone Encounter (Signed)
Rx refill sent to pharmacy. 

## 2015-05-22 ENCOUNTER — Other Ambulatory Visit: Payer: Self-pay | Admitting: Internal Medicine

## 2015-06-07 ENCOUNTER — Other Ambulatory Visit (INDEPENDENT_AMBULATORY_CARE_PROVIDER_SITE_OTHER): Payer: Medicare Other

## 2015-06-07 DIAGNOSIS — E119 Type 2 diabetes mellitus without complications: Secondary | ICD-10-CM | POA: Diagnosis not present

## 2015-06-07 DIAGNOSIS — E7800 Pure hypercholesterolemia, unspecified: Secondary | ICD-10-CM

## 2015-06-07 DIAGNOSIS — I10 Essential (primary) hypertension: Secondary | ICD-10-CM

## 2015-06-07 LAB — LIPID PANEL
CHOLESTEROL: 185 mg/dL (ref 0–200)
HDL: 38.1 mg/dL — ABNORMAL LOW (ref >39.00)
LDL CALC: 128 mg/dL — AB (ref 0–99)
NonHDL: 146.41
TRIGLYCERIDES: 91 mg/dL (ref 0.0–149.0)
Total CHOL/HDL Ratio: 5
VLDL: 18.2 mg/dL (ref 0.0–40.0)

## 2015-06-07 LAB — CBC WITH DIFFERENTIAL/PLATELET
Basophils Absolute: 0.1 10*3/uL (ref 0.0–0.1)
Basophils Relative: 1 % (ref 0.0–3.0)
EOS ABS: 0.1 10*3/uL (ref 0.0–0.7)
EOS PCT: 1.8 % (ref 0.0–5.0)
HCT: 40.5 % (ref 36.0–46.0)
HEMOGLOBIN: 13.6 g/dL (ref 12.0–15.0)
LYMPHS ABS: 1.6 10*3/uL (ref 0.7–4.0)
Lymphocytes Relative: 21.7 % (ref 12.0–46.0)
MCHC: 33.7 g/dL (ref 30.0–36.0)
MCV: 87.6 fl (ref 78.0–100.0)
MONO ABS: 0.4 10*3/uL (ref 0.1–1.0)
Monocytes Relative: 6.2 % (ref 3.0–12.0)
NEUTROS PCT: 69.3 % (ref 43.0–77.0)
Neutro Abs: 5 10*3/uL (ref 1.4–7.7)
Platelets: 285 10*3/uL (ref 150.0–400.0)
RBC: 4.63 Mil/uL (ref 3.87–5.11)
RDW: 13.6 % (ref 11.5–15.5)
WBC: 7.2 10*3/uL (ref 4.0–10.5)

## 2015-06-07 LAB — BASIC METABOLIC PANEL
BUN: 11 mg/dL (ref 6–23)
CALCIUM: 9.3 mg/dL (ref 8.4–10.5)
CO2: 26 meq/L (ref 19–32)
CREATININE: 0.73 mg/dL (ref 0.40–1.20)
Chloride: 108 mEq/L (ref 96–112)
GFR: 82.92 mL/min (ref >60.00)
GLUCOSE: 127 mg/dL — AB (ref 70–99)
Potassium: 4.2 mEq/L (ref 3.5–5.1)
SODIUM: 141 meq/L (ref 135–145)

## 2015-06-07 LAB — HEPATIC FUNCTION PANEL
ALT: 19 U/L (ref 0–35)
AST: 16 U/L (ref 0–37)
Albumin: 4 g/dL (ref 3.5–5.2)
Alkaline Phosphatase: 77 U/L (ref 39–117)
BILIRUBIN DIRECT: 0 mg/dL (ref 0.0–0.3)
BILIRUBIN TOTAL: 0.5 mg/dL (ref 0.2–1.2)
TOTAL PROTEIN: 6.9 g/dL (ref 6.0–8.3)

## 2015-06-07 LAB — MICROALBUMIN / CREATININE URINE RATIO
Creatinine,U: 157.7 mg/dL
Microalb Creat Ratio: 0.6 mg/g (ref 0.0–30.0)
Microalb, Ur: 1 mg/dL (ref 0.0–1.9)

## 2015-06-07 LAB — HEMOGLOBIN A1C: Hgb A1c MFr Bld: 7.1 % — ABNORMAL HIGH (ref 4.6–6.5)

## 2015-06-12 ENCOUNTER — Encounter: Payer: Self-pay | Admitting: Internal Medicine

## 2015-06-12 ENCOUNTER — Ambulatory Visit (INDEPENDENT_AMBULATORY_CARE_PROVIDER_SITE_OTHER): Payer: Medicare Other | Admitting: Internal Medicine

## 2015-06-12 VITALS — BP 128/70 | HR 61 | Temp 98.1°F | Resp 18 | Ht 59.0 in | Wt 195.1 lb

## 2015-06-12 DIAGNOSIS — I10 Essential (primary) hypertension: Secondary | ICD-10-CM | POA: Diagnosis not present

## 2015-06-12 DIAGNOSIS — R7989 Other specified abnormal findings of blood chemistry: Secondary | ICD-10-CM

## 2015-06-12 DIAGNOSIS — E7800 Pure hypercholesterolemia, unspecified: Secondary | ICD-10-CM

## 2015-06-12 DIAGNOSIS — E669 Obesity, unspecified: Secondary | ICD-10-CM

## 2015-06-12 DIAGNOSIS — E119 Type 2 diabetes mellitus without complications: Secondary | ICD-10-CM

## 2015-06-12 DIAGNOSIS — E039 Hypothyroidism, unspecified: Secondary | ICD-10-CM

## 2015-06-12 DIAGNOSIS — R945 Abnormal results of liver function studies: Secondary | ICD-10-CM

## 2015-06-12 MED ORDER — TRIAMCINOLONE ACETONIDE 0.1 % EX CREA: 1.0000 "application " | TOPICAL_CREAM | Freq: Two times a day (BID) | CUTANEOUS | Status: DC

## 2015-06-12 NOTE — Progress Notes (Signed)
Pre-visit discussion using our clinic review tool. No additional management support is needed unless otherwise documented below in the visit note.  

## 2015-06-12 NOTE — Progress Notes (Signed)
Patient ID: Veronica Branch, female   DOB: [birth year], 75 y.o.   MRN: [id number]   Subjective:    Patient ID: Veronica Branch, female    DOB: [birth year], 75 y.o.   MRN: [id number]  HPI  Patient here for a scheduled follow up.  She reports that she is more active.  Is not doing formal exercise.  No chest pain.  No sob.  No acid reflux.  No abdominal pain or cramping.  Bowels stable.  Overall she feels she is doing well.     Past Medical History  Diagnosis Date  . History of migraine headaches   . Hypothyroidism     s/p removal of right thyroid lobe and isthmus (1992)  . GERD (gastroesophageal reflux disease)   . Hiatal hernia   . Hypertension   . Hypercholesterolemia   . Hyperglycemia   . Diverticulosis    Past Surgical History  Procedure Laterality Date  . Thyroid lobectomy  1992    s/p removal of right thyroid lobe and isthmus  . Breast biopsy Right 2000    benign   Family History  Problem Relation Age of Onset  . Coronary artery disease Mother     myocardial infarction and CHF  . Breast cancer Mother     86's  . Breast cancer Maternal Aunt     x 2  . Colon cancer Neg Hx   . Breast cancer Sister   . Breast cancer Cousin    Social History   Social History  . Marital Status: Married    Spouse Name: N/A  . Number of Children: 2  . Years of Education: N/A   Occupational History  . retired Pharmacist, hospital    Social History Main Topics  . Smoking status: Never Smoker   . Smokeless tobacco: Never Used  . Alcohol Use: No  . Drug Use: No  . Sexual Activity: Not Asked   Other Topics Concern  . None   Social History Narrative   Regularly exercises. Retired and married.     Outpatient Encounter Prescriptions as of 06/12/2015  Medication Sig  . aspirin 81 MG tablet Take 81 mg by mouth daily.  . Blood Glucose Monitoring Suppl (ONE TOUCH ULTRA SYSTEM KIT) W/DEVICE KIT 1 kit by Does not apply route once.  Marland Kitchen glucose blood test strip Use to test blood sugars 1-2 times  daily  . Lancets (ONETOUCH ULTRASOFT) lancets Use to test blood sugars 1-2 times daily   E11.9  . lisinopril (PRINIVIL,ZESTRIL) 10 MG tablet TAKE 1 TABLET BY MOUTH EVERY DAY  . omeprazole (PRILOSEC) 20 MG capsule TAKE 1 CAPSULE BY MOUTH EVERY DAY  . propranolol (INDERAL) 40 MG tablet TAKE 1 TABLET BY MOUTH TWICE A DAY  . SYNTHROID 100 MCG tablet TAKE 1 TABLET BY MOUTH EVERY DAY  . [DISCONTINUED] propranolol (INDERAL) 40 MG tablet TAKE 1 TABLET BY MOUTH TWICE A DAY  . triamcinolone cream (KENALOG) 0.1 % Apply 1 application topically 2 (two) times daily.   No facility-administered encounter medications on file as of 06/12/2015.    Review of Systems  Constitutional: Negative for appetite change and unexpected weight change.  HENT: Negative for congestion and sinus pressure.   Respiratory: Negative for cough, chest tightness and shortness of breath.   Cardiovascular: Negative for chest pain and palpitations.  Gastrointestinal: Negative for nausea, vomiting, abdominal pain and diarrhea.  Genitourinary: Negative for dysuria and difficulty urinating.  Musculoskeletal: Negative for back pain and joint swelling.  Skin: Negative for  color change and rash.  Neurological: Negative for dizziness, light-headedness and headaches.  Psychiatric/Behavioral: Negative for dysphoric mood and agitation.       Objective:     Blood pressure rechecked by me:  120/72  Physical Exam  Constitutional: She appears well-developed and well-nourished. No distress.  HENT:  Nose: Nose normal.  Mouth/Throat: Oropharynx is clear and moist.  Neck: Neck supple. No thyromegaly present.  Cardiovascular: Normal rate and regular rhythm.   Pulmonary/Chest: Breath sounds normal. No respiratory distress. She has no wheezes.  Abdominal: Soft. Bowel sounds are normal. There is no tenderness.  Musculoskeletal: She exhibits no edema or tenderness.  Lymphadenopathy:    She has no cervical adenopathy.  Skin: No rash noted. No  erythema.  Psychiatric: She has a normal mood and affect. Her behavior is normal.    BP 128/70 mmHg  Pulse 61  Temp(Src) 98.1 F (36.7 C) (Oral)  Resp 18  Ht 4' 11"  (1.499 m)  Wt 195 lb 2 oz (88.508 kg)  BMI 39.39 kg/m2  SpO2 95% Wt Readings from Last 3 Encounters:  06/12/15 195 lb 2 oz (88.508 kg)  02/08/15 191 lb (86.637 kg)  10/09/14 190 lb 8 oz (86.41 kg)     Lab Results  Component Value Date   WBC 7.2 06/07/2015   HGB 13.6 06/07/2015   HCT 40.5 06/07/2015   PLT 285.0 06/07/2015   GLUCOSE 127* 06/07/2015   CHOL 185 06/07/2015   TRIG 91.0 06/07/2015   HDL 38.10* 06/07/2015   LDLDIRECT 156.0 12/06/2012   LDLCALC 128* 06/07/2015   ALT 19 06/07/2015   AST 16 06/07/2015   NA 141 06/07/2015   K 4.2 06/07/2015   CL 108 06/07/2015   CREATININE 0.73 06/07/2015   BUN 11 06/07/2015   CO2 26 06/07/2015   TSH 2.05 02/06/2015   HGBA1C 7.1* 06/07/2015   MICROALBUR 1.0 06/07/2015    Mm Screening Breast Tomo Bilateral  10/24/2014  CLINICAL DATA:  Screening. EXAM: DIGITAL SCREENING BILATERAL MAMMOGRAM WITH 3D TOMO WITH CAD COMPARISON:  Previous exam(s). ACR Breast Density Category b: There are scattered areas of fibroglandular density. FINDINGS: There are no findings suspicious for malignancy. Images were processed with CAD. IMPRESSION: No mammographic evidence of malignancy. A result letter of this screening mammogram will be mailed directly to the patient. RECOMMENDATION: Screening mammogram in one year. (Code:SM-B-01Y) BI-RADS CATEGORY  1: Negative. Electronically Signed   By: Franki Cabot M.D.   On: 10/24/2014 15:18       Assessment & Plan:   Problem List Items Addressed This Visit    Abnormal liver function test    Ultrasound revealed fatty liver.  Diet and exercise.  Follow liver panel.        Relevant Orders   Hepatic function panel   Diabetes ([place])    Discussed diet and exercise.  a1c just checked 7.1.  Low carb diet and exercise.  Follow met b and a1c.          Relevant Orders   Hemoglobin A1c   Hypercholesterolemia - Primary    Low cholesterol diet and exercise.  Follow lipid panel.  Recent LDL 128.        Relevant Orders   Lipid panel   Hypertension    Blood pressure under good control.  Continue same medication regimen.  Follow pressures.  Follow metabolic panel.        Relevant Orders   Basic metabolic panel   Hypothyroidism    On thyroid replacement.  Follow tsh.       Obesity (BMI 30-39.9)    Discussed diet and exercise.  Follow.           Einar Pheasant, MD

## 2015-06-17 ENCOUNTER — Encounter: Payer: Self-pay | Admitting: Internal Medicine

## 2015-06-17 NOTE — Assessment & Plan Note (Signed)
Discussed diet and exercise.  Follow.  

## 2015-06-17 NOTE — Assessment & Plan Note (Signed)
Discussed diet and exercise.  a1c just checked 7.1.  Low carb diet and exercise.  Follow met b and a1c.

## 2015-06-17 NOTE — Assessment & Plan Note (Signed)
Blood pressure under good control.  Continue same medication regimen.  Follow pressures.  Follow metabolic panel.   

## 2015-06-17 NOTE — Assessment & Plan Note (Signed)
On thyroid replacement.  Follow tsh.  

## 2015-06-17 NOTE — Assessment & Plan Note (Signed)
Low cholesterol diet and exercise.  Follow lipid panel.  Recent LDL 128.

## 2015-06-17 NOTE — Assessment & Plan Note (Signed)
Ultrasound revealed fatty liver.  Diet and exercise.  Follow liver panel.   

## 2015-07-20 ENCOUNTER — Telehealth: Payer: Self-pay | Admitting: Internal Medicine

## 2015-07-20 NOTE — Telephone Encounter (Signed)
Unable to call in abx over the phone without evaluation.  Needs to be evaluated to confirm correct diagnosis.  Needs to be seen.

## 2015-07-20 NOTE — Telephone Encounter (Signed)
Pt called about possibly having diverticulitis. Pt states it's been bothering her for about 3 days. Yesterday she was not hurting but she was bloated and today her stomach hurt to the touch and tenderness in the belly.   Call pt @ [phone]. Thank you!

## 2015-07-20 NOTE — Telephone Encounter (Signed)
Notified and verbalized understanding.  She will continue to monitor symptoms.  She will seek medical attention over the weekend if condition worsens. Otherwise, she will call next week for follow up if needed.

## 2015-07-20 NOTE — Telephone Encounter (Signed)
Please call her back and if unable to reach, call emergency contact number.  If pain and bloating, needs to be seen today.  Leave message to that affect if unable to reach.

## 2015-07-20 NOTE — Telephone Encounter (Signed)
Attempted to call patient regarding symptoms and for more information.  No answer.  Left a message to call the office back.

## 2015-07-20 NOTE — Telephone Encounter (Signed)
Pt does not want to go to the ER or Urgent Care.  States she has a history of these episodes. No N/V/D. She would like to have an antibiotic so her symptoms do not worsen over the weekend.  I advised she would need to be seen.  How would you like to address this?

## 2015-07-21 ENCOUNTER — Encounter: Payer: Self-pay | Admitting: Emergency Medicine

## 2015-07-21 ENCOUNTER — Emergency Department: Payer: Medicare Other

## 2015-07-21 ENCOUNTER — Emergency Department
Admission: EM | Admit: 2015-07-21 | Discharge: 2015-07-21 | Disposition: A | Discharge status: Home / Self Care | Payer: Medicare Other | Attending: Emergency Medicine | Admitting: Emergency Medicine

## 2015-07-21 DIAGNOSIS — K469 Unspecified abdominal hernia without obstruction or gangrene: Secondary | ICD-10-CM | POA: Insufficient documentation

## 2015-07-21 DIAGNOSIS — E039 Hypothyroidism, unspecified: Secondary | ICD-10-CM | POA: Insufficient documentation

## 2015-07-21 DIAGNOSIS — Z7982 Long term (current) use of aspirin: Secondary | ICD-10-CM | POA: Diagnosis not present

## 2015-07-21 DIAGNOSIS — R1084 Generalized abdominal pain: Secondary | ICD-10-CM | POA: Diagnosis present

## 2015-07-21 DIAGNOSIS — Z79899 Other long term (current) drug therapy: Secondary | ICD-10-CM | POA: Diagnosis not present

## 2015-07-21 DIAGNOSIS — I10 Essential (primary) hypertension: Secondary | ICD-10-CM | POA: Diagnosis not present

## 2015-07-21 DIAGNOSIS — K439 Ventral hernia without obstruction or gangrene: Secondary | ICD-10-CM

## 2015-07-21 LAB — COMPREHENSIVE METABOLIC PANEL
ALBUMIN: 4.1 g/dL (ref 3.5–5.0)
ALT: 19 U/L (ref 14–54)
ANION GAP: 6 (ref 5–15)
AST: 17 U/L (ref 15–41)
Alkaline Phosphatase: 79 U/L (ref 38–126)
BUN: 7 mg/dL (ref 6–20)
CO2: 27 mmol/L (ref 22–32)
Calcium: 8.9 mg/dL (ref 8.9–10.3)
Chloride: 107 mmol/L (ref 101–111)
Creatinine, Ser: 0.74 mg/dL (ref 0.44–1.00)
GFR calc Af Amer: >60 mL/min (ref >60)
GFR calc non Af Amer: >60 mL/min (ref >60)
GLUCOSE: 124 mg/dL — AB (ref 65–99)
POTASSIUM: 3.7 mmol/L (ref 3.5–5.1)
SODIUM: 140 mmol/L (ref 135–145)
Total Bilirubin: 0.9 mg/dL (ref 0.3–1.2)
Total Protein: 7.2 g/dL (ref 6.5–8.1)

## 2015-07-21 LAB — CBC
HEMATOCRIT: 39.4 % (ref 35.0–47.0)
HEMOGLOBIN: 13.8 g/dL (ref 12.0–16.0)
MCH: 30.5 pg (ref 26.0–34.0)
MCHC: 35.1 g/dL (ref 32.0–36.0)
MCV: 86.8 fL (ref 80.0–100.0)
Platelets: 220 10*3/uL (ref 150–440)
RBC: 4.53 MIL/uL (ref 3.80–5.20)
RDW: 13.3 % (ref 11.5–14.5)
WBC: 9 10*3/uL (ref 3.6–11.0)

## 2015-07-21 LAB — URINALYSIS COMPLETE WITH MICROSCOPIC (ARMC ONLY)
Bilirubin Urine: NEGATIVE
Glucose, UA: NEGATIVE mg/dL
Ketones, ur: NEGATIVE mg/dL
NITRITE: NEGATIVE
PH: 7 (ref 5.0–8.0)
Protein, ur: NEGATIVE mg/dL
SPECIFIC GRAVITY, URINE: 1.012 (ref 1.005–1.030)

## 2015-07-21 LAB — TROPONIN I

## 2015-07-21 LAB — LIPASE, BLOOD: Lipase: 23 U/L (ref 11–51)

## 2015-07-21 MED ORDER — SODIUM CHLORIDE 0.9 % IV BOLUS (SEPSIS)
1000.0000 mL | Freq: Once | INTRAVENOUS | Status: AC
  Administered 2015-07-21: 1000 mL via INTRAVENOUS

## 2015-07-21 MED ORDER — TRAMADOL HCL 50 MG PO TABS: 100.0000 mg | ORAL_TABLET | Freq: Four times a day (QID) | ORAL | Status: DC | PRN

## 2015-07-21 MED ORDER — IOPAMIDOL (ISOVUE-300) INJECTION 61%
100.0000 mL | Freq: Once | INTRAVENOUS | Status: AC | PRN
Start: 2015-07-21 — End: 2015-07-21
  Administered 2015-07-21: 100 mL via INTRAVENOUS

## 2015-07-21 MED ORDER — DIATRIZOATE MEGLUMINE & SODIUM 66-10 % PO SOLN
15.0000 mL | Freq: Once | ORAL | Status: AC
  Administered 2015-07-21: 15 mL via ORAL

## 2015-07-21 MED ORDER — IBUPROFEN 600 MG PO TABS: 600.0000 mg | ORAL_TABLET | Freq: Three times a day (TID) | ORAL | Status: DC | PRN

## 2015-07-21 MED ORDER — ONDANSETRON HCL 4 MG/2ML IJ SOLN
4.0000 mg | Freq: Once | INTRAMUSCULAR | Status: AC
  Administered 2015-07-21: 4 mg via INTRAVENOUS
  Filled 2015-07-21: qty 2

## 2015-07-21 MED ORDER — MORPHINE SULFATE (PF) 4 MG/ML IV SOLN
4.0000 mg | Freq: Once | INTRAVENOUS | Status: AC
  Administered 2015-07-21: 4 mg via INTRAVENOUS
  Filled 2015-07-21: qty 1

## 2015-07-21 NOTE — ED Notes (Signed)
Mid abdominal pain x 2 days, denies nausea or vomiting.

## 2015-07-21 NOTE — ED Provider Notes (Addendum)
[facility] Emergency Department Provider Note   ____________________________________________  Time seen: I have reviewed the triage vital signs and the triage nursing note.  HISTORY  Chief Complaint Abdominal Pain   Historian Patient and wife  HPI Veronica Branch is a 75 y.o. female is here for evaluation of abdominal pain. She started having some diffuse abdominal discomfort a few days ago and then today she noticed a break in the periumbilical midline abdomen area where there was a mass. No skin rash. No fevers. She has had nausea without vomiting. No diarrhea. No chest pain or trouble breathing. Symptoms are moderate to severe. Pain at worst is considered to severe.  She saw her a physician today who stated they were concerned about a hernia that there is a baseball sized mass in her abdomen. Now it seems smaller. But still very tender.    Past Medical History  Diagnosis Date  . History of migraine headaches   . Hypothyroidism     s/p removal of right thyroid lobe and isthmus (1992)  . GERD (gastroesophageal reflux disease)   . Hiatal hernia   . Hypertension   . Hypercholesterolemia   . Hyperglycemia   . Diverticulosis     Patient Active Problem List   Diagnosis Date Noted  . Health care maintenance 06/12/2014  . LLQ pain 06/12/2014  . Diverticulitis of colon 04/02/2014  . Obesity (BMI 30-39.9) 02/09/2014  . Vitamin D deficiency 02/09/2014  . Obesity (BMI 30.0-34.9) 07/22/2013  . Abnormal liver function test 12/12/2012  . Osteopenia 11/25/2011  . History of migraine headaches 11/25/2011  . Hypercholesterolemia 11/25/2011  . Hypertension 11/25/2011  . Diabetes ([place]) 11/25/2011  . Hypothyroidism 11/25/2011    Past Surgical History  Procedure Laterality Date  . Thyroid lobectomy  1992    s/p removal of right thyroid lobe and isthmus  . Breast biopsy Right 2000    benign    Current Outpatient Rx  Name  Route  Sig  Dispense  Refill   . aspirin 81 MG tablet   Oral   Take 81 mg by mouth daily.         . Blood Glucose Monitoring Suppl (ONE TOUCH ULTRA SYSTEM KIT) W/DEVICE KIT   Does not apply   1 kit by Does not apply route once.   1 each   0     E11.9   . glucose blood test strip      Use to test blood sugars 1-2 times daily   100 each   12     Pt uses ONE TOUCH ULTRA METER  E11.9   . Lancets (ONETOUCH ULTRASOFT) lancets      Use to test blood sugars 1-2 times daily   E11.9   100 each   12   . lisinopril (PRINIVIL,ZESTRIL) 10 MG tablet      TAKE 1 TABLET BY MOUTH EVERY DAY   90 tablet   3   . omeprazole (PRILOSEC) 20 MG capsule      TAKE 1 CAPSULE BY MOUTH EVERY DAY   90 capsule   2   . propranolol (INDERAL) 40 MG tablet      TAKE 1 TABLET BY MOUTH TWICE A DAY   180 tablet   5   . SYNTHROID 100 MCG tablet      TAKE 1 TABLET BY MOUTH EVERY DAY   90 tablet   3     Dispense as written.   . triamcinolone cream (KENALOG) 0.1 %  Topical   Apply 1 application topically 2 (two) times daily.   30 g   0     Allergies Ciprofloxacin hcl; Penicillins; and Sulfa antibiotics  Family History  Problem Relation Age of Onset  . Coronary artery disease Mother     myocardial infarction and CHF  . Breast cancer Mother     77's  . Breast cancer Maternal Aunt     x 2  . Colon cancer Neg Hx   . Breast cancer Sister   . Breast cancer Cousin     Social History Social History  Substance Use Topics  . Smoking status: Never Smoker   . Smokeless tobacco: Never Used  . Alcohol Use: No    Review of Systems  Constitutional: Negative for fever. Eyes: Negative for visual changes. ENT: Negative for sore throat. Cardiovascular: Negative for chest pain. Respiratory: Negative for shortness of breath. Gastrointestinal: Negative for vomiting and diarrhea. Genitourinary: Negative for dysuria. Musculoskeletal: Negative for back pain. Skin: Negative for rash. Neurological: Negative for  headache. 10 point Review of Systems otherwise negative ____________________________________________   PHYSICAL EXAM:  VITAL SIGNS: ED Triage Vitals  Enc Vitals Group     BP 07/21/15 1024 162/73 mmHg     Pulse Rate 07/21/15 1024 60     Resp 07/21/15 1024 18     Temp 07/21/15 1024 97.9 F (36.6 C)     Temp Source 07/21/15 1024 Oral     SpO2 07/21/15 1024 97 %     Weight 07/21/15 1024 189 lb (85.73 kg)     Height 07/21/15 1024 5' (1.524 m)     Head Cir --      Peak Flow --      Pain Score 07/21/15 1025 5     Pain Loc --      Pain Edu? --      Excl. in [place]? --      Constitutional: Alert and oriented. Well appearing and in no distress. HEENT   Head: Normocephalic and atraumatic.      Eyes: Conjunctivae are normal. PERRL. Normal extraocular movements.      Ears:         Nose: No congestion/rhinnorhea.   Mouth/Throat: Mucous membranes are moist.   Neck: No stridor. Cardiovascular/Chest: Normal rate, regular rhythm.  No murmurs, rubs, or gallops. Respiratory: Normal respiratory effort without tachypnea nor retractions. Breath sounds are clear and equal bilaterally. No wheezes/rales/rhonchi. Gastrointestinal: Soft. No distention, no guarding, no rebound. Small tennis ball sized tender mass just above umbilicus.   Genitourinary/rectal:Deferred Musculoskeletal: Nontender with normal range of motion in all extremities. No joint effusions.  No lower extremity tenderness.  No edema. Neurologic:  Normal speech and language. No gross or focal neurologic deficits are appreciated. Skin:  Skin is warm, dry and intact. No rash noted. Psychiatric: Mood and affect are normal. Speech and behavior are normal. Patient exhibits appropriate insight and judgment.  ____________________________________________   EKG I, Lisa Roca, MD, the attending physician have personally viewed and interpreted all ECGs.  60 beats per minute. normal sinus rhythm. Narrow QRS. Normal axis. Nonspecific  ST and T-wave ____________________________________________  LABS (pertinent positives/negatives)  Labs Reviewed  COMPREHENSIVE METABOLIC PANEL - Abnormal; Notable for the following:    Glucose, Bld 124 (*)    All other components within normal limits  URINALYSIS COMPLETEWITH MICROSCOPIC ([facility] ONLY) - Abnormal; Notable for the following:    Color, Urine YELLOW (*)    APPearance CLEAR (*)    Hgb urine  dipstick 2+ (*)    Leukocytes, UA TRACE (*)    Bacteria, UA MANY (*)    Squamous Epithelial / LPF 0-5 (*)    All other components within normal limits  URINE CULTURE  LIPASE, BLOOD  CBC  TROPONIN I    ____________________________________________  RADIOLOGY All Xrays were viewed by me. Imaging interpreted by Radiologist.  CT abdomen post with contrast:IMPRESSION: 1. Small superior periumbilical hernia demonstrates wall thickening and surrounding and internal fat stranding, suggesting either cellulitis or ischemia/fat necrosis. No herniated bowel loops. No fluid collections or masses. 2. Mild sigmoid diverticulosis, with no evidence of acute diverticulitis. No evidence of bowel obstruction or acute bowel inflammation. 3. Aortic atherosclerosis.  __________________________________________  PROCEDURES  Procedure(s) performed: None  Critical Care performed: None  ____________________________________________   ED COURSE / ASSESSMENT AND PLAN  Pertinent labs & imaging results that were available during my care of the patient were reviewed by me and considered in my medical decision making (see chart for details).   This patient is here for evaluation of abdominal pain, and on exam I'm concerned about incarcerated hernia. She did have a periumbilical mass about the size of tennis ball that when I pushed on it it did gargle and become smaller to the size of about a tennis ball which was still especially tender. Next  CT scan was obtained and showed no evidence of  diverticulitis or other cause of the diffuse pains that she's been having a past couple days, and the hernia that was seen is only fat containing.  General surgeon on call will see the patient here in the ED.  After 1 dose of morphine patient was expressing some relief, but stated the patient was starting to come back and currently 6 out of 10. I am going to give her second dose of morphine.  Patient care transferred to Dr. Marcelene Butte at shift change 3:30pm.    Surgery consultation is pending. Disposition per general surgeon recommendation.  Addended to include, I spoke with the general surgeon who recommends discharge home with ibuprofen and tramadol, follow up as an outpatient in the office and I will provide the number. Patient was discharged with my discharge instructions.  CONSULTATIONS:  Dr. Rosalia Hammers, general surgery.   Patient / Family / Caregiver informed of clinical course, medical decision-making process, and agree with plan.  ___________________________________________   FINAL CLINICAL IMPRESSION(S) / ED DIAGNOSES   Final diagnoses:  Hernia of abdominal wall              Note: This dictation was prepared with Dragon dictation. Any transcriptional errors that result from this process are unintentional   Lisa Roca, MD 07/21/15 1516  Lisa Roca, MD 07/21/15 1534

## 2015-07-21 NOTE — H&P (Signed)
Patient ID: Veronica Branch, female   DOB: [birth year], 75 y.o.   MRN: [id number]  History of Present Illness Veronica Branch is a 75 y.o. female with fat containing periumbilical hernia.  She has noticed a bulge there intermittently for over a year, but since Wednesday has had significant pain and calor in that area.  Denies N/V, some constipation but is passing gas and is having bowel movements.  Denies F/C.  Past Medical History Past Medical History  Diagnosis Date  . History of migraine headaches   . Hypothyroidism     s/p removal of right thyroid lobe and isthmus (1992)  . GERD (gastroesophageal reflux disease)   . Hiatal hernia   . Hypertension   . Hypercholesterolemia   . Hyperglycemia   . Diverticulosis        Past Surgical History  Procedure Laterality Date  . Thyroid lobectomy  1992    s/p removal of right thyroid lobe and isthmus  . Breast biopsy Right 2000    benign    Allergies  Allergen Reactions  . Ciprofloxacin Hcl Other (See Comments)    tendonitis  . Penicillins Hives    Urticaria   . Sulfa Antibiotics Rash    No current facility-administered medications for this encounter.   Current Outpatient Prescriptions  Medication Sig Dispense Refill  . aspirin 81 MG tablet Take 81 mg by mouth daily.    . Blood Glucose Monitoring Suppl (ONE TOUCH ULTRA SYSTEM KIT) W/DEVICE KIT 1 kit by Does not apply route once. 1 each 0  . glucose blood test strip Use to test blood sugars 1-2 times daily 100 each 12  . ibuprofen (ADVIL,MOTRIN) 600 MG tablet Take 1 tablet (600 mg total) by mouth every 8 (eight) hours as needed. 20 tablet 0  . Lancets (ONETOUCH ULTRASOFT) lancets Use to test blood sugars 1-2 times daily   E11.9 100 each 12  . lisinopril (PRINIVIL,ZESTRIL) 10 MG tablet TAKE 1 TABLET BY MOUTH EVERY DAY 90 tablet 3  . omeprazole (PRILOSEC) 20 MG capsule TAKE 1 CAPSULE BY MOUTH EVERY DAY 90 capsule 2  . propranolol (INDERAL) 40 MG tablet TAKE 1 TABLET BY MOUTH  TWICE A DAY 180 tablet 5  . SYNTHROID 100 MCG tablet TAKE 1 TABLET BY MOUTH EVERY DAY 90 tablet 3  . traMADol (ULTRAM) 50 MG tablet Take 2 tablets (100 mg total) by mouth every 6 (six) hours as needed (moderate to severe pain). 25 tablet 0  . triamcinolone cream (KENALOG) 0.1 % Apply 1 application topically 2 (two) times daily. 30 g 0    Family History Family History  Problem Relation Age of Onset  . Coronary artery disease Mother     myocardial infarction and CHF  . Breast cancer Mother     58's  . Breast cancer Maternal Aunt     x 2  . Colon cancer Neg Hx   . Breast cancer Sister   . Breast cancer Cousin        Social History Social History  Substance Use Topics  . Smoking status: Never Smoker   . Smokeless tobacco: Never Used  . Alcohol Use: No        ROS As per HPI, otherwise 12 pt ROS is wnl   Physical Exam Blood pressure 153/65, pulse 66, temperature 97.9 F (36.6 C), temperature source Oral, resp. rate 16, height 5' (1.524 m), weight 189 lb (85.73 kg), SpO2 99 %.  CONSTITUTIONAL: alert and oriented, NAD EYES: Pupils equal,  round, and reactive to light, Sclera non-icteric. EARS, NOSE, MOUTH AND THROAT: The oropharynx is clear. Oral mucosa is pink and moist. Hearing is intact to voice.  NECK: Trachea is midline, and there is no jugular venous distension. Thyroid is without palpable abnormalities. LYMPH NODES:  Lymph nodes in the neck are not enlarged. RESPIRATORY:  Normal respiratory effort without pathologic use of accessory muscles. CARDIOVASCULAR: Heart is regular without murmurs, gallops, or rubs. GI: The abdomen is soft, min tender peribumbilically, and nondistended. Small fat containing umbilical hernia, not entirely reducible GU: deferred MUSCULOSKELETAL:  Normal muscle strength and tone in all four extremities.    SKIN: Skin turgor is normal. There are no pathologic skin lesions.  NEUROLOGIC:  Motor and sensation is grossly normal.  Cranial nerves are  grossly intact. PSYCH:  Alert and oriented to person, place and time. Affect is normal.  Data Reviewed CT A/P: fat containing small umbilical hernia with some fat stranding, no bowel involvement  I have personally reviewed the patient's imaging and medical records.    Assessment    Fat containing incarcerated umb. hernia  Plan    Recommend pain control for small portion of ischemic fat, attempted to completely reduce for pain relief but was unable to.  We will see her back in a week or two to discuss elective repair.  She was instructed on signs of bowel incarceration including fevers, N/V, skin changes, worsening pain and obstipation.  Face-to-face time spent with the patient and care providers was 30 minutes, with more than 50% of the time spent counseling, educating, and coordinating care of the patient.     [employer] Pizzini 07/21/2015, 4:01 PM

## 2015-07-21 NOTE — Discharge Instructions (Signed)
You were evaluated for hernia, and found to have a fat-containing hernia for which she will be treated with pain medication.  Return for department for any worsening abdominal pain, larger abdominal swelling, fever, vomiting, or any other symptoms concerning to you.  You should follow up with general surgery, office numbers provided, to consider elective repair and to ensure this resolves.   Hernia, Adult A hernia is the bulging of an organ or tissue through a weak spot in the muscles of the abdomen (abdominal wall). Hernias develop most often near the navel or groin. There are many kinds of hernias. Common kinds include:  Femoral hernia. This kind of hernia develops under the groin in the upper thigh area.  Inguinal hernia. This kind of hernia develops in the groin or scrotum.  Umbilical hernia. This kind of hernia develops near the navel.  Hiatal hernia. This kind of hernia causes part of the stomach to be pushed up into the chest.  Incisional hernia. This kind of hernia bulges through a scar from an abdominal surgery. CAUSES This condition may be caused by:  Heavy lifting.  Coughing over a long period of time.  Straining to have a bowel movement.  An incision made during an abdominal surgery.  A birth defect (congenital defect).  Excess weight or obesity.  Smoking.  Poor nutrition.  Cystic fibrosis.  Excess fluid in the abdomen.  Undescended testicles. SYMPTOMS Symptoms of a hernia include:  A lump on the abdomen. This is the first sign of a hernia. The lump may become more obvious with standing, straining, or coughing. It may get bigger over time if it is not treated or if the condition causing it is not treated.  Pain. A hernia is usually painless, but it may become painful over time if treatment is delayed. The pain is usually dull and may get worse with standing or lifting heavy objects. Sometimes a hernia gets tightly squeezed in the weak spot (strangulated)  or stuck there (incarcerated) and causes additional symptoms. These symptoms may include:  Vomiting.  Nausea.  Constipation.  Irritability. DIAGNOSIS A hernia may be diagnosed with:  A physical exam. During the exam your health care provider may ask you to cough or to make a specific movement, because a hernia is usually more visible when you move.  Imaging tests. These can include:  X-rays.  Ultrasound.  CT scan. TREATMENT A hernia that is small and painless may not need to be treated. A hernia that is large or painful may be treated with surgery. Inguinal hernias may be treated with surgery to prevent incarceration or strangulation. Strangulated hernias are always treated with surgery, because lack of blood to the trapped organ or tissue can cause it to die. Surgery to treat a hernia involves pushing the bulge back into place and repairing the weak part of the abdomen. HOME CARE INSTRUCTIONS  Avoid straining.  Do not lift anything heavier than 10 lb (4.5 kg).  Lift with your leg muscles, not your back muscles. This helps avoid strain.  When coughing, try to cough gently.  Prevent constipation. Constipation leads to straining with bowel movements, which can make a hernia worse or cause a hernia repair to break down. You can prevent constipation by:  Eating a high-fiber diet that includes plenty of fruits and vegetables.  Drinking enough fluids to keep your urine clear or pale yellow. Aim to drink 6-8 glasses of water per day.  Using a stool softener as directed by your health care  provider.  Lose weight, if you are overweight.  Do not use any tobacco products, including cigarettes, chewing tobacco, or electronic cigarettes. If you need help quitting, ask your health care provider.  Keep all follow-up visits as directed by your health care provider. This is important. Your health care provider may need to monitor your condition. SEEK MEDICAL CARE IF:  You have  swelling, redness, and pain in the affected area.  Your bowel habits change. SEEK IMMEDIATE MEDICAL CARE IF:  You have a fever.  You have abdominal pain that is getting worse.  You feel nauseous or you vomit.  You cannot push the hernia back in place by gently pressing on it while you are lying down.  The hernia:  Changes in shape or size.  Is stuck outside the abdomen.  Becomes discolored.  Feels hard or tender.   This information is not intended to replace advice given to you by your health care provider. Make sure you discuss any questions you have with your health care provider.   Document Released: 01/06/2005 Document Revised: 01/27/2014 Document Reviewed: 11/16/2013 Elsevier Interactive Patient Education [employer]2016 Elsevier Inc.

## 2015-07-22 LAB — URINE CULTURE

## 2015-08-24 ENCOUNTER — Other Ambulatory Visit: Payer: Self-pay | Admitting: Internal Medicine

## 2015-08-28 ENCOUNTER — Other Ambulatory Visit: Payer: Self-pay | Admitting: Internal Medicine

## 2015-10-11 ENCOUNTER — Other Ambulatory Visit (INDEPENDENT_AMBULATORY_CARE_PROVIDER_SITE_OTHER): Payer: Medicare Other

## 2015-10-11 DIAGNOSIS — R945 Abnormal results of liver function studies: Secondary | ICD-10-CM

## 2015-10-11 DIAGNOSIS — I10 Essential (primary) hypertension: Secondary | ICD-10-CM

## 2015-10-11 DIAGNOSIS — R7989 Other specified abnormal findings of blood chemistry: Secondary | ICD-10-CM

## 2015-10-11 DIAGNOSIS — E119 Type 2 diabetes mellitus without complications: Secondary | ICD-10-CM | POA: Diagnosis not present

## 2015-10-11 DIAGNOSIS — E7800 Pure hypercholesterolemia, unspecified: Secondary | ICD-10-CM | POA: Diagnosis not present

## 2015-10-11 LAB — LIPID PANEL
CHOLESTEROL: 157 mg/dL (ref 0–200)
HDL: 44.5 mg/dL (ref >39.00)
LDL Cholesterol: 96 mg/dL (ref 0–99)
NONHDL: 112.91
Total CHOL/HDL Ratio: 4
Triglycerides: 86 mg/dL (ref 0.0–149.0)
VLDL: 17.2 mg/dL (ref 0.0–40.0)

## 2015-10-11 LAB — BASIC METABOLIC PANEL
BUN: 8 mg/dL (ref 6–23)
CALCIUM: 8.9 mg/dL (ref 8.4–10.5)
CHLORIDE: 108 meq/L (ref 96–112)
CO2: 29 meq/L (ref 19–32)
CREATININE: 0.71 mg/dL (ref 0.40–1.20)
GFR: 85.54 mL/min (ref >60.00)
GLUCOSE: 111 mg/dL — AB (ref 70–99)
Potassium: 4.3 mEq/L (ref 3.5–5.1)
Sodium: 142 mEq/L (ref 135–145)

## 2015-10-11 LAB — HEPATIC FUNCTION PANEL
ALT: 18 U/L (ref 0–35)
AST: 15 U/L (ref 0–37)
Albumin: 3.8 g/dL (ref 3.5–5.2)
Alkaline Phosphatase: 78 U/L (ref 39–117)
BILIRUBIN DIRECT: 0 mg/dL (ref 0.0–0.3)
BILIRUBIN TOTAL: 0.4 mg/dL (ref 0.2–1.2)
Total Protein: 7 g/dL (ref 6.0–8.3)

## 2015-10-11 LAB — HEMOGLOBIN A1C: Hgb A1c MFr Bld: 6.8 % — ABNORMAL HIGH (ref 4.6–6.5)

## 2015-10-13 LAB — HM DIABETES EYE EXAM

## 2015-10-16 ENCOUNTER — Ambulatory Visit (INDEPENDENT_AMBULATORY_CARE_PROVIDER_SITE_OTHER): Payer: Medicare Other | Admitting: Internal Medicine

## 2015-10-16 ENCOUNTER — Encounter: Payer: Self-pay | Admitting: Internal Medicine

## 2015-10-16 VITALS — BP 120/80 | HR 59 | Temp 98.0°F | Wt 193.6 lb

## 2015-10-16 DIAGNOSIS — E669 Obesity, unspecified: Secondary | ICD-10-CM

## 2015-10-16 DIAGNOSIS — E7800 Pure hypercholesterolemia, unspecified: Secondary | ICD-10-CM

## 2015-10-16 DIAGNOSIS — E039 Hypothyroidism, unspecified: Secondary | ICD-10-CM | POA: Diagnosis not present

## 2015-10-16 DIAGNOSIS — Z1239 Encounter for other screening for malignant neoplasm of breast: Secondary | ICD-10-CM | POA: Diagnosis not present

## 2015-10-16 DIAGNOSIS — E119 Type 2 diabetes mellitus without complications: Secondary | ICD-10-CM

## 2015-10-16 DIAGNOSIS — R7989 Other specified abnormal findings of blood chemistry: Secondary | ICD-10-CM

## 2015-10-16 DIAGNOSIS — I10 Essential (primary) hypertension: Secondary | ICD-10-CM

## 2015-10-16 DIAGNOSIS — R945 Abnormal results of liver function studies: Secondary | ICD-10-CM

## 2015-10-16 NOTE — Progress Notes (Signed)
Patient ID: Veronica Branch, female   DOB: 19-Feb-1940, 75 y.o.   MRN: [id number]   Subjective:    Patient ID: Veronica Branch, female    DOB: March 07, 1940, 75 y.o.   MRN: [id number]  HPI  Patient here for a scheduled follow up.  She has been trying to do better with her diet.  Decreased sugar intake. Tries to stay active.  No chest pain.  No sob.  No acid reflux.  No abdominal pain or cramping.  Bowels stable.  Discussed labs.  a1c improved.  LDL 96.  Recently evaluated for abdominal pain.  CT as outlined.  Saw surgery.  No further w/up warranted.  She reports no pain now.    Past Medical History:  Diagnosis Date  . Diverticulosis   . GERD (gastroesophageal reflux disease)   . Hiatal hernia   . History of migraine headaches   . Hypercholesterolemia   . Hyperglycemia   . Hypertension   . Hypothyroidism    s/p removal of right thyroid lobe and isthmus (1992)   Past Surgical History:  Procedure Laterality Date  . BREAST BIOPSY Right 2000   benign  . THYROID LOBECTOMY  1992   s/p removal of right thyroid lobe and isthmus   Family History  Problem Relation Age of Onset  . Coronary artery disease Mother     myocardial infarction and CHF  . Breast cancer Mother     51's  . Breast cancer Maternal Aunt     x 2  . Breast cancer Sister   . Breast cancer Cousin   . Colon cancer Neg Hx    Social History   Social History  . Marital status: Married    Spouse name: N/A  . Number of children: 2  . Years of education: N/A   Occupational History  . retired Pharmacist, hospital    Social History Main Topics  . Smoking status: Never Smoker  . Smokeless tobacco: Never Used  . Alcohol use No  . Drug use: No  . Sexual activity: Not Asked   Other Topics Concern  . None   Social History Narrative   Regularly exercises. Retired and married.     Outpatient Encounter Prescriptions as of 10/16/2015  Medication Sig  . aspirin 81 MG tablet Take 81 mg by mouth daily.  . Blood Glucose Monitoring  Suppl (ONE TOUCH ULTRA SYSTEM KIT) W/DEVICE KIT 1 kit by Does not apply route once.  Marland Kitchen ibuprofen (ADVIL,MOTRIN) 600 MG tablet Take 1 tablet (600 mg total) by mouth every 8 (eight) hours as needed.  . Lancets (ONETOUCH ULTRASOFT) lancets Use to test blood sugars 1-2 times daily   E11.9  . lisinopril (PRINIVIL,ZESTRIL) 10 MG tablet TAKE 1 TABLET BY MOUTH EVERY DAY  . omeprazole (PRILOSEC) 20 MG capsule TAKE 1 CAPSULE BY MOUTH EVERY DAY  . ONE TOUCH ULTRA TEST test strip TEST BLOOD SUGARS 1 TO 2 TIMES DAILY  . propranolol (INDERAL) 40 MG tablet TAKE 1 TABLET BY MOUTH TWICE A DAY  . SYNTHROID 100 MCG tablet TAKE 1 TABLET BY MOUTH EVERY DAY  . traMADol (ULTRAM) 50 MG tablet Take 2 tablets (100 mg total) by mouth every 6 (six) hours as needed (moderate to severe pain).  . triamcinolone cream (KENALOG) 0.1 % Apply 1 application topically 2 (two) times daily.   No facility-administered encounter medications on file as of 10/16/2015.     Review of Systems  Constitutional: Negative for appetite change and unexpected weight change.  HENT:  Negative for congestion and sinus pressure.   Respiratory: Negative for cough, chest tightness and shortness of breath.   Cardiovascular: Negative for chest pain, palpitations and leg swelling.  Gastrointestinal: Negative for abdominal pain, diarrhea, nausea and vomiting.  Genitourinary: Negative for difficulty urinating and dysuria.  Musculoskeletal: Negative for back pain and joint swelling.  Skin: Negative for color change and rash.  Neurological: Negative for dizziness, light-headedness and headaches.  Psychiatric/Behavioral: Negative for agitation and dysphoric mood.       Objective:    Physical Exam  Constitutional: She appears well-developed and well-nourished. No distress.  HENT:  Nose: Nose normal.  Mouth/Throat: Oropharynx is clear and moist.  Neck: Neck supple. No thyromegaly present.  Cardiovascular: Normal rate and regular rhythm.     Pulmonary/Chest: Breath sounds normal. No respiratory distress. She has no wheezes.  Abdominal: Soft. Bowel sounds are normal. There is no tenderness.  Musculoskeletal: She exhibits no edema or tenderness.  Lymphadenopathy:    She has no cervical adenopathy.  Skin: No rash noted. No erythema.  Psychiatric: She has a normal mood and affect. Her behavior is normal.    BP 120/80   Pulse (!) 59   Temp 98 F (36.7 C) (Oral)   Wt 193 lb 9.6 oz (87.8 kg)   SpO2 94%   BMI 37.81 kg/m  Wt Readings from Last 3 Encounters:  10/16/15 193 lb 9.6 oz (87.8 kg)  07/21/15 189 lb (85.7 kg)  06/12/15 195 lb 2 oz (88.5 kg)     Lab Results  Component Value Date   WBC 9.0 07/21/2015   HGB 13.8 07/21/2015   HCT 39.4 07/21/2015   PLT 220 07/21/2015   GLUCOSE 111 (H) 10/11/2015   CHOL 157 10/11/2015   TRIG 86.0 10/11/2015   HDL 44.50 10/11/2015   LDLDIRECT 156.0 12/06/2012   LDLCALC 96 10/11/2015   ALT 18 10/11/2015   AST 15 10/11/2015   NA 142 10/11/2015   K 4.3 10/11/2015   CL 108 10/11/2015   CREATININE 0.71 10/11/2015   BUN 8 10/11/2015   CO2 29 10/11/2015   TSH 2.05 02/06/2015   HGBA1C 6.8 (H) 10/11/2015   MICROALBUR 1.0 06/07/2015    Ct Abdomen Pelvis W Contrast  Result Date: 07/21/2015 CLINICAL DATA:  Abdominal pain, left lower quadrant, history of diverticulitis. Periumbilical tenderness. EXAM: CT ABDOMEN AND PELVIS WITH CONTRAST TECHNIQUE: Multidetector CT imaging of the abdomen and pelvis was performed using the standard protocol following bolus administration of intravenous contrast. CONTRAST:  15m ISOVUE-300 IOPAMIDOL (ISOVUE-300) INJECTION 61% COMPARISON:  03/17/2014 CT abdomen/pelvis. FINDINGS: Lower chest: Subpleural 3 mm right middle lobe pulmonary nodule associated with the minor fissure, stable since 03/17/2014, probably benign. Calcified 3 mm lingular granuloma. Mild bibasilar scarring versus atelectasis. Top-normal heart size. Hepatobiliary: Normal liver with no liver  mass. Normal gallbladder with no radiopaque cholelithiasis. No biliary ductal dilatation. Pancreas: Normal, with no mass or duct dilation. Spleen: Normal size. No mass. Adrenals/Urinary Tract: Normal adrenals. Hypodense 0.4 cm renal cortical lesion in the upper left kidney, too small to characterize, not appreciably changed, suggesting a benign renal cyst. Otherwise normal kidneys, with no hydronephrosis. Normal bladder. Stomach/Bowel: Grossly normal stomach. Normal caliber small bowel with no small bowel wall thickening. Appendix appears stable and within normal limits. Mild sigmoid diverticulosis, with no large bowel wall thickening or pericolonic fat stranding. Vascular/Lymphatic: Atherosclerotic nonaneurysmal abdominal aorta. Patent portal, splenic, hepatic and renal veins. No pathologically enlarged lymph nodes in the abdomen or pelvis. Reproductive: Grossly normal uterus.  No adnexal mass. Other: No pneumoperitoneum, ascites or focal fluid collection. There is a small fat containing superior periumbilical hernia demonstrating wall thickening and surrounding and internal fat stranding. No focal fluid collections. No herniated bowel loops. No mass. Musculoskeletal: No aggressive appearing focal osseous lesions. Mild degenerative changes in the visualized thoracolumbar spine. IMPRESSION: 1. Small superior periumbilical hernia demonstrates wall thickening and surrounding and internal fat stranding, suggesting either cellulitis or ischemia/fat necrosis. No herniated bowel loops. No fluid collections or masses. 2. Mild sigmoid diverticulosis, with no evidence of acute diverticulitis. No evidence of bowel obstruction or acute bowel inflammation. 3. Aortic atherosclerosis. Electronically Signed   By: Ilona Sorrel M.D.   On: 07/21/2015 14:46       Assessment & Plan:   Problem List Items Addressed This Visit    Abnormal liver function test    Ultrasound revealed fatty liver.  Diet, exercise and weight loss.   Follow liver panel.  Most recent check wnl.       Relevant Orders   Hepatic function panel   Diabetes ([place])    A1C improved.  Recent check 6.8.  Low carb diet and exercise.  Follow metb and a1c.        Relevant Orders   Hemoglobin A1c   Hypercholesterolemia    Recent LDL improved.  Recent check 96.  Low cholesterol diet and exercise.  Follow lipid panel.        Relevant Orders   Lipid panel   Hypertension    Blood pressure under good control.  Continue same medication regimen.  Follow pressures.  Follow metabolic panel.        Relevant Orders   Basic metabolic panel   Hypothyroidism    On thyroid replacement.  Follow tsh.       Relevant Orders   TSH   Obesity (BMI 30-39.9)    Discussed diet and exercise.         Other Visit Diagnoses    Screening breast examination    -  Primary   Relevant Orders   MM Digital Screening       Einar Pheasant, MD

## 2015-10-16 NOTE — Progress Notes (Signed)
Pre visit review using our clinic review tool, if applicable. No additional management support is needed unless otherwise documented below in the visit note. 

## 2015-10-22 ENCOUNTER — Encounter: Payer: Self-pay | Admitting: Internal Medicine

## 2015-10-22 NOTE — Assessment & Plan Note (Signed)
Recent LDL improved.  Recent check 96.  Low cholesterol diet and exercise.  Follow lipid panel.

## 2015-10-22 NOTE — Assessment & Plan Note (Signed)
On thyroid replacement.  Follow tsh.  

## 2015-10-22 NOTE — Assessment & Plan Note (Signed)
Discussed diet and exercise 

## 2015-10-22 NOTE — Assessment & Plan Note (Signed)
Ultrasound revealed fatty liver.  Diet, exercise and weight loss.  Follow liver panel.  Most recent check wnl.

## 2015-10-22 NOTE — Assessment & Plan Note (Signed)
Blood pressure under good control.  Continue same medication regimen.  Follow pressures.  Follow metabolic panel.   

## 2015-10-22 NOTE — Assessment & Plan Note (Signed)
A1C improved.  Recent check 6.8.  Low carb diet and exercise.  Follow metb and a1c.

## 2015-10-28 ENCOUNTER — Other Ambulatory Visit: Payer: Self-pay | Admitting: Internal Medicine

## 2015-10-31 ENCOUNTER — Other Ambulatory Visit: Payer: Self-pay | Admitting: Internal Medicine

## 2015-10-31 ENCOUNTER — Ambulatory Visit
Admission: RE | Admit: 2015-10-31 | Discharge: 2015-10-31 | Disposition: A | Discharge status: Home / Self Care | Payer: Medicare Other | Source: Ambulatory Visit | Attending: Internal Medicine | Admitting: Internal Medicine

## 2015-10-31 DIAGNOSIS — Z1231 Encounter for screening mammogram for malignant neoplasm of breast: Secondary | ICD-10-CM | POA: Insufficient documentation

## 2015-10-31 DIAGNOSIS — Z1239 Encounter for other screening for malignant neoplasm of breast: Secondary | ICD-10-CM

## 2015-11-01 LAB — HM MAMMOGRAPHY

## 2015-11-22 ENCOUNTER — Other Ambulatory Visit: Payer: Self-pay | Admitting: Internal Medicine

## 2015-12-10 ENCOUNTER — Encounter: Payer: Self-pay | Admitting: Family

## 2015-12-10 ENCOUNTER — Ambulatory Visit (INDEPENDENT_AMBULATORY_CARE_PROVIDER_SITE_OTHER): Payer: Medicare Other | Admitting: Family

## 2015-12-10 ENCOUNTER — Telehealth: Payer: Self-pay | Admitting: Internal Medicine

## 2015-12-10 VITALS — BP 140/80 | HR 67 | Temp 98.1°F | Wt 195.4 lb

## 2015-12-10 DIAGNOSIS — R21 Rash and other nonspecific skin eruption: Secondary | ICD-10-CM | POA: Diagnosis not present

## 2015-12-10 MED ORDER — VALACYCLOVIR HCL 1 G PO TABS: 1000.0000 mg | ORAL_TABLET | Freq: Three times a day (TID) | ORAL | 0 refills | Status: DC

## 2015-12-10 MED ORDER — PREDNISONE 10 MG PO TABS: ORAL_TABLET | ORAL | 0 refills | Status: DC

## 2015-12-10 NOTE — Progress Notes (Signed)
Pre visit review using our clinic review tool, if applicable. No additional management support is needed unless otherwise documented below in the visit note. 

## 2015-12-10 NOTE — Telephone Encounter (Signed)
Spoke with patient states she has 5-6 red spots underneath right eye that is bothering her, has been bothering her X 2 days draining clear fluid and  stinging.   Applied Benadryl cream to it yesterday .   Documented in chart she had Zoster vaccine 11/09/2013.  Patient scheduled for appointment to be evaluated by Rennie PlowmanMargaret Arnett today.

## 2015-12-10 NOTE — Telephone Encounter (Signed)
Pt called and stated that she thinks that she might possible have shingles on her face and stated that her right eye draining (watering) and is swollen. There is some swelling as well. There are no more appts for today and pt wants to know if she can wait until tomorrow for an appt since it is by her eye. Please advise, thank you!  Call pt @ [phone]

## 2015-12-10 NOTE — Patient Instructions (Addendum)
Lesions are not consistent with classic shingles. Suspect contact dermatitis.  Trial of oral benadryl tonight. Call opthalmology tomorrow and go ahead and make an appointment to ensure no eye involvement..  Start prednisone in the morning.   If worsens or becomes more of a blister, please call us FIRST thing and will send in shingles anti viral medicine which you have a prescription for.  If there is no improvement in your symptoms, or if there is any worsening of symptoms, or if you have any additional concerns, please return for re-evaluation; or, if we are closed, consider going to the Emergency Room for evaluation if symptoms urgent.

## 2015-12-10 NOTE — Progress Notes (Signed)
Subjective:    Patient ID: Veronica Branch, female    DOB: July 13, 1940, 75 y.o.   MRN: [id number]  CC: Veronica Branch is a 75 y.o. female who presents today for an acute visit.    HPI: CC: Bumps under right eye which started yesterday. Worsening. Started with one bump and now has a couple of them, non draining. No blisters.  Clear runny discharge from right eye.  No pain or itching. Initially thought an insect bite. Tried topical benadryl. No fever, HA, changes in vision.  No new eye creams.      Has had zoster vaccine.         HISTORY:  Past Medical History:  Diagnosis Date  . Diverticulosis   . GERD (gastroesophageal reflux disease)   . Hiatal hernia   . History of migraine headaches   . Hypercholesterolemia   . Hyperglycemia   . Hypertension   . Hypothyroidism    s/p removal of right thyroid lobe and isthmus (1992)   Past Surgical History:  Procedure Laterality Date  . BREAST BIOPSY Right 2000   benign  . THYROID LOBECTOMY  1992   s/p removal of right thyroid lobe and isthmus   Family History  Problem Relation Age of Onset  . Coronary artery disease Mother     myocardial infarction and CHF  . Breast cancer Mother     34's  . Breast cancer Maternal Aunt     x 2  . Breast cancer Sister   . Breast cancer Cousin   . Colon cancer Neg Hx     Allergies: Ciprofloxacin hcl; Penicillins; and Sulfa antibiotics Current Outpatient Prescriptions on File Prior to Visit  Medication Sig Dispense Refill  . aspirin 81 MG tablet Take 81 mg by mouth daily.    . Blood Glucose Monitoring Suppl (ONE TOUCH ULTRA SYSTEM KIT) W/DEVICE KIT 1 kit by Does not apply route once. 1 each 0  . ibuprofen (ADVIL,MOTRIN) 600 MG tablet Take 1 tablet (600 mg total) by mouth every 8 (eight) hours as needed. 20 tablet 0  . Lancets (ONETOUCH ULTRASOFT) lancets Use to test blood sugars 1-2 times daily   E11.9 100 each 12  . lisinopril (PRINIVIL,ZESTRIL) 10 MG tablet TAKE 1 TABLET BY MOUTH  EVERY DAY 90 tablet 3  . omeprazole (PRILOSEC) 20 MG capsule TAKE 1 CAPSULE BY MOUTH EVERY DAY 90 capsule 2  . ONE TOUCH ULTRA TEST test strip TEST BLOOD SUGARS 1 TO 2 TIMES DAILY 100 each 0  . propranolol (INDERAL) 40 MG tablet TAKE 1 TABLET BY MOUTH TWICE A DAY 180 tablet 5  . SYNTHROID 100 MCG tablet TAKE 1 TABLET BY MOUTH EVERY DAY 90 tablet 3  . traMADol (ULTRAM) 50 MG tablet Take 2 tablets (100 mg total) by mouth every 6 (six) hours as needed (moderate to severe pain). 25 tablet 0  . triamcinolone cream (KENALOG) 0.1 % Apply 1 application topically 2 (two) times daily. 30 g 0   No current facility-administered medications on file prior to visit.     Social History  Substance Use Topics  . Smoking status: Never Smoker  . Smokeless tobacco: Never Used  . Alcohol use No    Review of Systems  Constitutional: Negative for chills and fever.  HENT: Negative for congestion and sinus pressure.   Eyes: Positive for discharge. Negative for photophobia, pain, redness, itching and visual disturbance.  Respiratory: Negative for cough.   Cardiovascular: Negative for chest pain and palpitations.  Gastrointestinal: Negative for nausea and vomiting.  Skin: Positive for rash.      Objective:    BP 140/80   Pulse 67   Temp 98.1 F (36.7 C) (Oral)   Wt 195 lb 6.4 oz (88.6 kg)   SpO2 94%   BMI 38.16 kg/m    Physical Exam  Constitutional: She appears well-developed and well-nourished.  HENT:  Head: Normocephalic and atraumatic.  Right Ear: Hearing, tympanic membrane, external ear and ear canal normal. No drainage, swelling or tenderness. No foreign bodies. Tympanic membrane is not erythematous and not bulging. No middle ear effusion. No decreased hearing is noted.  Left Ear: Hearing, tympanic membrane, external ear and ear canal normal. No drainage, swelling or tenderness. No foreign bodies. Tympanic membrane is not erythematous and not bulging.  No middle ear effusion. No decreased  hearing is noted.  Nose: Nose normal. No rhinorrhea. Right sinus exhibits no maxillary sinus tenderness and no frontal sinus tenderness. Left sinus exhibits no maxillary sinus tenderness and no frontal sinus tenderness.  Mouth/Throat: Uvula is midline, oropharynx is clear and moist and mucous membranes are normal. No oropharyngeal exudate, posterior oropharyngeal edema, posterior oropharyngeal erythema or tonsillar abscesses.  Eyes: Conjunctivae and EOM are normal. Pupils are equal, round, and reactive to light. Lids are everted and swept, no foreign bodies found. Right eye exhibits no discharge. Left eye exhibits no discharge and no hordeolum. Right conjunctiva is not injected. Right conjunctiva has no hemorrhage. Left conjunctiva is not injected. Left conjunctiva has no hemorrhage. No scleral icterus.  No external eye lesions. Surrounding skin intact.   Right eye:   No injection of the conjunctiva. No white spots, opacity, or foreign body appreciated. No collection of blood or pus in the anterior chamber. No ciliary flush surrounding iris.   No photophobia or eye pain appreciated during exam.   Cardiovascular: Normal rate, regular rhythm, normal heart sounds and normal pulses.   Pulmonary/Chest: Effort normal and breath sounds normal. She has no wheezes. She has no rhonchi. She has no rales.  Lymphadenopathy:       Head (right side): No submental, no submandibular, no tonsillar, no preauricular, no posterior auricular and no occipital adenopathy present.       Head (left side): No submental, no submandibular, no tonsillar, no preauricular, no posterior auricular and no occipital adenopathy present.    She has no cervical adenopathy.  Neurological: She is alert.  Skin: Skin is warm and dry.     Erythematous, discrete firm nodules on right cheek of face. Largest approx 3cm. no increased warmth or purulent discharge. No streaking.  Psychiatric: She has a normal mood and affect. Her speech is  normal and behavior is normal. Thought content normal.  Vitals reviewed.      Assessment & Plan:   1. Rash and nonspecific skin eruption Etiology of rash is nonspecific at this time. Rash does not exhibit classic features of zoster including pain, vesicles. Have concern as the rash is proximal to her right eye. Benign eye exam. Treating patient as contact dermatitis with over-the-counter Benadryl and oral prednisone which she will start in the morning. No evidence of bacterial infection at this time. Advised patient to call ophthalmology first thing in the morning to make an appointment since the rash is so close to her eye. Advised patient if overnight lesions become more vesicular or more painful, she is to start Valtrex for zoster. Patient verbalized understanding of plan and return precautions.   - predniSONE (DELTASONE) 10  MG tablet; Take 40 mg by mouth on day 1, then taper 10 mg daily until gone  Dispense: 10 tablet; Refill: 0 - valACYclovir (VALTREX) 1000 MG tablet; Take 1 tablet (1,000 mg total) by mouth 3 (three) times daily.  Dispense: 21 tablet; Refill: 0    I am having Ms. Dorton start on predniSONE. I am also having her maintain her ONE TOUCH ULTRA SYSTEM KIT, onetouch ultrasoft, aspirin, propranolol, triamcinolone cream, ibuprofen, traMADol, SYNTHROID, ONE TOUCH ULTRA TEST, omeprazole, lisinopril, and valACYclovir.   Meds ordered this encounter  Medications  . predniSONE (DELTASONE) 10 MG tablet    Sig: Take 40 mg by mouth on day 1, then taper 10 mg daily until gone    Dispense:  10 tablet    Refill:  0    Order Specific Question:   Supervising Provider    Answer:   Deborra Medina L [[id number]]  . DISCONTD: valACYclovir (VALTREX) 1000 MG tablet    Sig: Take 1 tablet (1,000 mg total) by mouth 3 (three) times daily.    Dispense:  21 tablet    Refill:  0    Order Specific Question:   Supervising Provider    Answer:   Deborra Medina L [[id number]]  . valACYclovir (VALTREX) 1000 MG  tablet    Sig: Take 1 tablet (1,000 mg total) by mouth 3 (three) times daily.    Dispense:  21 tablet    Refill:  0    Order Specific Question:   Supervising Provider    Answer:   Crecencio Mc [[id number]]    Return precautions given.   Risks, benefits, and alternatives of the medications and treatment plan prescribed today were discussed, and patient expressed understanding.   Education regarding symptom management and diagnosis given to patient on AVS.  Continue to follow with Einar Pheasant, MD for routine health maintenance.   Zara Chess and I agreed with plan.   Mable Paris, FNP

## 2015-12-11 ENCOUNTER — Telehealth: Payer: Self-pay | Admitting: Internal Medicine

## 2015-12-11 NOTE — Telephone Encounter (Signed)
FYI

## 2015-12-11 NOTE — Telephone Encounter (Signed)
Pt called back in regards to her condition that she came in for yesterday, pt stated that she went to her eye doctor and confirmed it as shingles. She was prescribed ethromyician.   Call pt @ [phone]

## 2015-12-12 NOTE — Telephone Encounter (Signed)
Thanks. Noted.

## 2015-12-21 ENCOUNTER — Ambulatory Visit (INDEPENDENT_AMBULATORY_CARE_PROVIDER_SITE_OTHER): Payer: Medicare Other | Admitting: Internal Medicine

## 2015-12-21 ENCOUNTER — Encounter: Payer: Self-pay | Admitting: Internal Medicine

## 2015-12-21 DIAGNOSIS — R21 Rash and other nonspecific skin eruption: Secondary | ICD-10-CM

## 2015-12-21 DIAGNOSIS — R140 Abdominal distension (gaseous): Secondary | ICD-10-CM

## 2015-12-21 DIAGNOSIS — I10 Essential (primary) hypertension: Secondary | ICD-10-CM

## 2015-12-21 LAB — CBC WITH DIFFERENTIAL/PLATELET
BASOS ABS: 0.1 10*3/uL (ref 0.0–0.1)
BASOS PCT: 0.9 % (ref 0.0–3.0)
EOS PCT: 1.9 % (ref 0.0–5.0)
Eosinophils Absolute: 0.2 10*3/uL (ref 0.0–0.7)
HEMATOCRIT: 41.8 % (ref 36.0–46.0)
Hemoglobin: 14.1 g/dL (ref 12.0–15.0)
LYMPHS PCT: 19.4 % (ref 12.0–46.0)
Lymphs Abs: 1.7 10*3/uL (ref 0.7–4.0)
MCHC: 33.8 g/dL (ref 30.0–36.0)
MCV: 87.6 fl (ref 78.0–100.0)
MONOS PCT: 6.1 % (ref 3.0–12.0)
Monocytes Absolute: 0.6 10*3/uL (ref 0.1–1.0)
NEUTROS ABS: 6.5 10*3/uL (ref 1.4–7.7)
Neutrophils Relative %: 71.7 % (ref 43.0–77.0)
PLATELETS: 266 10*3/uL (ref 150.0–400.0)
RBC: 4.78 Mil/uL (ref 3.87–5.11)
RDW: 13.5 % (ref 11.5–15.5)
WBC: 9 10*3/uL (ref 4.0–10.5)

## 2015-12-21 MED ORDER — VALACYCLOVIR HCL 1 G PO TABS: 1000.0000 mg | ORAL_TABLET | Freq: Three times a day (TID) | ORAL | 0 refills | Status: DC

## 2015-12-21 NOTE — Progress Notes (Signed)
Patient ID: Veronica Branch, female   DOB: 01/01/1941, 75 y.o.   MRN: [id number]   Subjective:    Patient ID: Veronica Branch, female    DOB: 07-21-1940, 75 y.o.   MRN: [id number]  HPI  Patient here as a work in with concerns regarding rash.  Was seen by Mable Paris on 11/2015 for rash - face/over her eye.  She followed up with Dr Gloriann Loan - optometry.  Diagnosed with shingles.  Has been followed by him.  Took valtrex.  Finished one week of valtrex three days ago.  Had some GI issues while on the medication.  Described bloating and discomfort.  Is some better today.  Bowels are moving.  She is eating something.  No nausea or vomiting.  She now has a rash on her right lower abdomen.  Some discomfort.  Concerned may have another outbreak of shingles.  No fever.  No joint aches.  No headaches.     Past Medical History:  Diagnosis Date  . Diverticulosis   . GERD (gastroesophageal reflux disease)   . Hiatal hernia   . History of migraine headaches   . Hypercholesterolemia   . Hyperglycemia   . Hypertension   . Hypothyroidism    s/p removal of right thyroid lobe and isthmus (1992)   Past Surgical History:  Procedure Laterality Date  . BREAST BIOPSY Right 2000   benign  . THYROID LOBECTOMY  1992   s/p removal of right thyroid lobe and isthmus   Family History  Problem Relation Age of Onset  . Coronary artery disease Mother     myocardial infarction and CHF  . Breast cancer Mother     49's  . Breast cancer Maternal Aunt     x 2  . Breast cancer Sister   . Breast cancer Cousin   . Colon cancer Neg Hx    Social History   Social History  . Marital status: Married    Spouse name: N/A  . Number of children: 2  . Years of education: N/A   Occupational History  . retired Pharmacist, hospital    Social History Main Topics  . Smoking status: Never Smoker  . Smokeless tobacco: Never Used  . Alcohol use No  . Drug use: No  . Sexual activity: Not Asked   Other Topics Concern  . None    Social History Narrative   Regularly exercises. Retired and married.     Outpatient Encounter Prescriptions as of 12/21/2015  Medication Sig  . aspirin 81 MG tablet Take 81 mg by mouth daily.  . Blood Glucose Monitoring Suppl (ONE TOUCH ULTRA SYSTEM KIT) W/DEVICE KIT 1 kit by Does not apply route once.  Marland Kitchen ibuprofen (ADVIL,MOTRIN) 600 MG tablet Take 1 tablet (600 mg total) by mouth every 8 (eight) hours as needed.  . Lancets (ONETOUCH ULTRASOFT) lancets Use to test blood sugars 1-2 times daily   E11.9  . lisinopril (PRINIVIL,ZESTRIL) 10 MG tablet TAKE 1 TABLET BY MOUTH EVERY DAY  . omeprazole (PRILOSEC) 20 MG capsule TAKE 1 CAPSULE BY MOUTH EVERY DAY  . ONE TOUCH ULTRA TEST test strip TEST BLOOD SUGARS 1 TO 2 TIMES DAILY  . propranolol (INDERAL) 40 MG tablet TAKE 1 TABLET BY MOUTH TWICE A DAY  . SYNTHROID 100 MCG tablet TAKE 1 TABLET BY MOUTH EVERY DAY  . traMADol (ULTRAM) 50 MG tablet Take 2 tablets (100 mg total) by mouth every 6 (six) hours as needed (moderate to severe pain).  . triamcinolone  cream (KENALOG) 0.1 % Apply 1 application topically 2 (two) times daily.  . valACYclovir (VALTREX) 1000 MG tablet Take 1 tablet (1,000 mg total) by mouth 3 (three) times daily.  . [DISCONTINUED] predniSONE (DELTASONE) 10 MG tablet Take 40 mg by mouth on day 1, then taper 10 mg daily until gone  . [DISCONTINUED] valACYclovir (VALTREX) 1000 MG tablet Take 1 tablet (1,000 mg total) by mouth 3 (three) times daily.   No facility-administered encounter medications on file as of 12/21/2015.     Review of Systems  Constitutional: Negative for fever and unexpected weight change.  HENT: Negative for congestion and sinus pressure.   Respiratory: Negative for cough, chest tightness and shortness of breath.   Cardiovascular: Negative for chest pain, palpitations and leg swelling.  Gastrointestinal:       Some abdominal bloating.  Some discomfort.  Some bowel change.  Better now.  Eating.  Some decreased  appetite.    Genitourinary: Negative for difficulty urinating and dysuria.  Musculoskeletal: Negative for joint swelling and myalgias.  Skin: Positive for rash.       Rash - lower abdomen - right lower abdomen.  Healed lesions - right face.    Neurological: Negative for dizziness, light-headedness and headaches.  Psychiatric/Behavioral: Negative for agitation and dysphoric mood.       Objective:    Physical Exam  Constitutional: She appears well-developed and well-nourished. No distress.  HENT:  Nose: Nose normal.  Mouth/Throat: Oropharynx is clear and moist.  Neck: Neck supple.  Cardiovascular: Normal rate and regular rhythm.   Pulmonary/Chest: Breath sounds normal. No respiratory distress. She has no wheezes.  Abdominal: Soft. Bowel sounds are normal. There is no tenderness.  Musculoskeletal: She exhibits no edema or tenderness.  Lymphadenopathy:    She has no cervical adenopathy.  Skin:  Erythematous based rash - right lower abdomen.  Appears to be c/w shingles.  Isolated lesion - left wrist.    Psychiatric: She has a normal mood and affect. Her behavior is normal.    BP 120/82   Pulse 67   Temp 97.9 F (36.6 C) (Oral)   Wt 195 lb 6.4 oz (88.6 kg)   SpO2 94%   BMI 38.16 kg/m  Wt Readings from Last 3 Encounters:  12/21/15 195 lb 6.4 oz (88.6 kg)  12/10/15 195 lb 6.4 oz (88.6 kg)  10/16/15 193 lb 9.6 oz (87.8 kg)     Lab Results  Component Value Date   WBC 9.0 12/21/2015   HGB 14.1 12/21/2015   HCT 41.8 12/21/2015   PLT 266.0 12/21/2015   GLUCOSE 111 (H) 10/11/2015   CHOL 157 10/11/2015   TRIG 86.0 10/11/2015   HDL 44.50 10/11/2015   LDLDIRECT 156.0 12/06/2012   LDLCALC 96 10/11/2015   ALT 18 10/11/2015   AST 15 10/11/2015   NA 142 10/11/2015   K 4.3 10/11/2015   CL 108 10/11/2015   CREATININE 0.71 10/11/2015   BUN 8 10/11/2015   CO2 29 10/11/2015   TSH 2.05 02/06/2015   HGBA1C 6.8 (H) 10/11/2015   MICROALBUR 1.0 06/07/2015    Mm Screening Breast  Tomo Bilateral  Result Date: 11/01/2015 CLINICAL DATA:  Screening. EXAM: 2D DIGITAL SCREENING BILATERAL MAMMOGRAM WITH CAD AND ADJUNCT TOMO COMPARISON:  Previous exam(s). ACR Breast Density Category b: There are scattered areas of fibroglandular density. FINDINGS: There are no findings suspicious for malignancy. Images were processed with CAD. IMPRESSION: No mammographic evidence of malignancy. A result letter of this screening mammogram will be  mailed directly to the patient. RECOMMENDATION: Screening mammogram in one year. (Code:SM-B-01Y) BI-RADS CATEGORY  1: Negative. Electronically Signed   By: Franki Cabot M.D.   On: 11/01/2015 09:07       Assessment & Plan:   Problem List Items Addressed This Visit    Abdominal bloating    She related her symptoms to the valtrex.   Is getting better.  Off valtrex.  Unclear etiology.  Some decreased appetite.  Bland foods.  Advance as tolerated.  Bowels are moving.  No nausea or vomiting.  Follow closely.  Call with update over the next few days.        Hypertension    Blood pressure under good control.  Continue same medication regimen.  Follow pressures.  Follow metabolic panel.        Rash    Erythematous based rash - right lower abdomen.  Appears to be c/w shingles.  Healed lesions right face.  Single lesion on forearm.  Discussed with her at length.  Discussed my concern regarding possible shingles - right lower abdomen.  Treat with valtrex.  Check cbc.  Follow closely.  Call with update over the next few days.  Any worsening or change in symptoms, she needs to be reevaluated.         Other Visit Diagnoses    Rash and nonspecific skin eruption       Relevant Medications   valACYclovir (VALTREX) 1000 MG tablet   Other Relevant Orders   CBC with Differential/Platelet (Completed)      I spent 25 minutes with the patient and more than 50% of the time was spent in consultation regarding the above.  Time spent obtaining history and discussing her  previous symptoms and current/ongoing issues.  Discussed treatment and planned f/u.     Einar Pheasant, MD

## 2015-12-21 NOTE — Progress Notes (Signed)
Pre visit review using our clinic review tool, if applicable. No additional management support is needed unless otherwise documented below in the visit note. 

## 2015-12-23 ENCOUNTER — Encounter: Payer: Self-pay | Admitting: Internal Medicine

## 2015-12-23 DIAGNOSIS — R21 Rash and other nonspecific skin eruption: Secondary | ICD-10-CM | POA: Insufficient documentation

## 2015-12-23 DIAGNOSIS — R140 Abdominal distension (gaseous): Secondary | ICD-10-CM | POA: Insufficient documentation

## 2015-12-23 NOTE — Assessment & Plan Note (Signed)
Blood pressure under good control.  Continue same medication regimen.  Follow pressures.  Follow metabolic panel.   

## 2015-12-23 NOTE — Assessment & Plan Note (Signed)
Erythematous based rash - right lower abdomen.  Appears to be c/w shingles.  Healed lesions right face.  Single lesion on forearm.  Discussed with her at length.  Discussed my concern regarding possible shingles - right lower abdomen.  Treat with valtrex.  Check cbc.  Follow closely.  Call with update over the next few days.  Any worsening or change in symptoms, she needs to be reevaluated.

## 2015-12-23 NOTE — Assessment & Plan Note (Signed)
She related her symptoms to the valtrex.   Is getting better.  Off valtrex.  Unclear etiology.  Some decreased appetite.  Bland foods.  Advance as tolerated.  Bowels are moving.  No nausea or vomiting.  Follow closely.  Call with update over the next few days.

## 2015-12-24 ENCOUNTER — Telehealth: Payer: Self-pay | Admitting: Internal Medicine

## 2015-12-24 NOTE — Telephone Encounter (Signed)
Pt called to inform that the medication of valACYclovir (VALTREX) 1000 MG tablet it is working fine. Pt does not have any problems with her stomach and the same outbreaks but like just one or two about the same.   Call pt @ [phone]. Thank you!

## 2015-12-25 NOTE — Telephone Encounter (Signed)
fyi

## 2015-12-25 NOTE — Telephone Encounter (Signed)
Thank her for the update.  Keep us posted.

## 2015-12-28 ENCOUNTER — Telehealth: Payer: Self-pay | Admitting: Internal Medicine

## 2015-12-28 NOTE — Telephone Encounter (Signed)
Pt husband call and stated that pt is in pain and is unable to control her bladder. She is still taking the medication for shingles. She took Azo yesterday which seemed to help a little during the day but has been in a lot of pain. Please advise, thank you!  Call pt @ [phone]

## 2015-12-28 NOTE — Telephone Encounter (Signed)
Spoke with patient stated that she was [facility] she is being treated for kidney infection she is almost to point of needing IV antibiotic.  She was treated with Nitrofurantoin 100 mg 1 bid.  She is wondering if this is this is an antibiotic she has been treated with in the past.  Her WBC count was elevated today.  Patient states she still has dysuria when she urinates .  Can she continue AZO?  Please advise.

## 2015-12-28 NOTE — Telephone Encounter (Signed)
She is to stay hydrated.  Yes is ok to take azo.  If any worsening symptoms, she needs to be evaluated.

## 2015-12-28 NOTE — Telephone Encounter (Signed)
Spoke with Bill patient husband advised of below and he verbalized and understanding

## 2015-12-28 NOTE — Telephone Encounter (Signed)
Noted.  Please f/u and make sure pt doing ok.

## 2015-12-28 NOTE — Telephone Encounter (Signed)
Spoke with patient states last night she was having urinary frequency .  Today she is having low back pain right sided groin pain , decreased urine out put , chills.  She is now [facility] her way [facility].

## 2016-01-09 ENCOUNTER — Telehealth: Payer: Self-pay | Admitting: *Deleted

## 2016-01-09 NOTE — Telephone Encounter (Signed)
Patient was seen at [facility] last week for a UTI, pt was prescribed medication with no results,pt currently has symptoms of frequent urination with small amount to void  Pt contact [phone]

## 2016-01-09 NOTE — Telephone Encounter (Signed)
Tried calling answering machine off.

## 2016-01-10 NOTE — Telephone Encounter (Signed)
Patient calls finished Macrobid last Thursday for UTI treated by [facility].   When she stands up looses control of bladder, having urinary frequency and dysuria. No fever or chills.  Not taking AZO.   Patient states symptoms returned Monday.  Patient was wondering if she could come by and leave urine specimen.  I advised we didn't have any appointments available.  Please advise

## 2016-01-10 NOTE — Telephone Encounter (Signed)
Patient advised of below she will go to urgent care to get evaluated.

## 2016-01-10 NOTE — Telephone Encounter (Signed)
If she is having persistent symptoms and just completed abx, I recommend a reevaluation.  See if she is able to return to acute care today for evaluation since apparently no appts available today

## 2016-01-10 NOTE — Telephone Encounter (Signed)
Patient returning your call she is at home.

## 2016-01-28 ENCOUNTER — Telehealth: Payer: Self-pay | Admitting: *Deleted

## 2016-01-28 DIAGNOSIS — Z87440 Personal history of urinary (tract) infections: Secondary | ICD-10-CM

## 2016-01-28 NOTE — Telephone Encounter (Signed)
Can you place a referral or does she need to be seen.

## 2016-01-28 NOTE — Telephone Encounter (Signed)
If she has had more than 3 urinary tract infections this year, then I do recommend referral to urology for evaluation with question of why occurring.

## 2016-01-28 NOTE — Telephone Encounter (Signed)
Pt was treated at the walk in clinic for UTI, pt was prescribed a antibiotic and the prescription makes her feel better however the UTI returns. Patient requested Dr. Roby LoftsScott's opinion about being referred to a urologist due to her history of Uti's.  Pt contact   [phone]

## 2016-01-29 NOTE — Telephone Encounter (Signed)
No preference , [place] wants to stay in [place] [facility] maybe does not want to go to [place]. Patient is scheduled with Dr. Adriana Simasook tomorrow for acute uti symptoms. FYI sent for Dr. Adriana Simasook

## 2016-01-29 NOTE — Telephone Encounter (Signed)
Patient had 2 within the month of December  And one now, she has a past HX of seeing Urology Dr. Achilles Dunkope for frequent UTI's and does not want to go out of town to see. Patient  Major complaint is frequency, with burning and stinging, at times incontinent, at this time last ABX was for [facility] completed ABX on 01/17/16, and symptoms are coming back, no culture [facility] to [facility].

## 2016-01-29 NOTE — Telephone Encounter (Signed)
I can place order for referral.  Does she have a preference of who she sees.  Also, if acute symptoms now, needs to be evaluated.

## 2016-01-30 ENCOUNTER — Encounter: Payer: Self-pay | Admitting: Family Medicine

## 2016-01-30 ENCOUNTER — Ambulatory Visit (INDEPENDENT_AMBULATORY_CARE_PROVIDER_SITE_OTHER): Payer: Medicare Other | Admitting: Family Medicine

## 2016-01-30 VITALS — BP 118/74 | HR 63 | Temp 98.5°F | Resp 14 | Wt 197.2 lb

## 2016-01-30 DIAGNOSIS — N390 Urinary tract infection, site not specified: Secondary | ICD-10-CM | POA: Insufficient documentation

## 2016-01-30 LAB — POCT URINALYSIS DIP (MANUAL ENTRY)
BILIRUBIN UA: NEGATIVE
Bilirubin, UA: NEGATIVE
Glucose, UA: NEGATIVE
Nitrite, UA: NEGATIVE
PH UA: 5.5
PROTEIN UA: NEGATIVE
SPEC GRAV UA: 1.015
Urobilinogen, UA: 0.2

## 2016-01-30 MED ORDER — CEFDINIR 300 MG PO CAPS: 300.0000 mg | ORAL_CAPSULE | Freq: Two times a day (BID) | ORAL | 0 refills | Status: DC

## 2016-01-30 NOTE — Patient Instructions (Addendum)
Antibiotic as prescribed.  We will call with urology referral  Take care  Dr. Adriana Simasook

## 2016-01-30 NOTE — Assessment & Plan Note (Signed)
New problem. UA revealed large blood and small leukocytes. Starting on Omnicef while awaiting culture. Urology referral in process.

## 2016-01-30 NOTE — Telephone Encounter (Signed)
Order placed for urology referral.  

## 2016-01-30 NOTE — Progress Notes (Signed)
Subjective:  Patient ID: Veronica Branch, female    DOB: 10/24/1940  Age: 76 y.o. MRN: [id number]  CC: Concern for UTI  HPI:  76 year old female presents with concerns for UTI.   Patient was recently seen at [facility] walk in clinic for urinary symptoms. She was diagnosed with UTI and was treated with Macrobid (12/28/15). Culture revealed E coli. Was sensitive to medication given (Macrobid). Patient had a recurrence of symptoms and was given another Rx for Macrobid. Symptoms improved but once again recurred. Patient developed frequency and urgency over the past 3 days. She hs had incontinence. No dysuria. No flank pain. No fever. Urology referral has been placed by PCP and is in process.   Social Hx   Social History   Social History  . Marital status: Married    Spouse name: N/A  . Number of children: 2  . Years of education: N/A   Occupational History  . retired Runner, broadcasting/film/video    Social History Main Topics  . Smoking status: Never Smoker  . Smokeless tobacco: Never Used  . Alcohol use No  . Drug use: No  . Sexual activity: Not Asked   Other Topics Concern  . None   Social History Narrative   Regularly exercises. Retired and married.     Review of Systems  Constitutional: Negative.   Genitourinary: Positive for frequency and urgency. Negative for dysuria.   Objective:  BP 118/74   Pulse 63   Temp 98.5 F (36.9 C) (Oral)   Resp 14   Wt 197 lb 3.2 oz (89.4 kg)   SpO2 97%   BMI 38.51 kg/m   BP/Weight 01/30/2016 12/21/2015 12/10/2015  Systolic BP 118 120 140  Diastolic BP 74 82 80  Wt. (Lbs) 197.2 195.4 195.4  BMI 38.51 38.16 38.16    Physical Exam  Constitutional: She is oriented to person, place, and time. She appears well-developed. No distress.  Cardiovascular: Normal rate and regular rhythm.   Pulmonary/Chest: Effort normal and breath sounds normal.  Abdominal: Soft. She exhibits no distension. There is no tenderness.  Neurological: She is alert and oriented  to person, place, and time.  Psychiatric: She has a normal mood and affect.    Lab Results  Component Value Date   WBC 9.0 12/21/2015   HGB 14.1 12/21/2015   HCT 41.8 12/21/2015   PLT 266.0 12/21/2015   GLUCOSE 111 (H) 10/11/2015   CHOL 157 10/11/2015   TRIG 86.0 10/11/2015   HDL 44.50 10/11/2015   LDLDIRECT 156.0 12/06/2012   LDLCALC 96 10/11/2015   ALT 18 10/11/2015   AST 15 10/11/2015   NA 142 10/11/2015   K 4.3 10/11/2015   CL 108 10/11/2015   CREATININE 0.71 10/11/2015   BUN 8 10/11/2015   CO2 29 10/11/2015   TSH 2.05 02/06/2015   HGBA1C 6.8 (H) 10/11/2015   MICROALBUR 1.0 06/07/2015   Results for orders placed or performed in visit on 01/30/16 (from the past 24 hour(s))  POCT urinalysis dipstick     Status: Abnormal   Collection Time: 01/30/16 11:41 AM  Result Value Ref Range   Color, UA yellow yellow   Clarity, UA cloudy (A) clear   Glucose, UA negative negative   Bilirubin, UA negative negative   Ketones, POC UA negative negative   Spec Grav, UA 1.015    Blood, UA large (A) negative   pH, UA 5.5    Protein Ur, POC negative negative   Urobilinogen, UA 0.2  Nitrite, UA Negative Negative   Leukocytes, UA small (1+) (A) Negative     Assessment & Plan:   Problem List Items Addressed This Visit    Recurrent UTI - Primary    New problem. UA revealed large blood and small leukocytes. Starting on Omnicef while awaiting culture. Urology referral in process.      Relevant Medications   cefdinir (OMNICEF) 300 MG capsule   Other Relevant Orders   POCT urinalysis dipstick (Completed)   Urine Culture      Meds ordered this encounter  Medications  . cefdinir (OMNICEF) 300 MG capsule    Sig: Take 1 capsule (300 mg total) by mouth 2 (two) times daily.    Dispense:  14 capsule    Refill:  0    Follow-up: PRN  Everlene OtherJayce Dorrian Doggett DO [facility] [place]

## 2016-01-30 NOTE — Progress Notes (Signed)
Pre visit review using our clinic review tool, if applicable. No additional management support is needed unless otherwise documented below in the visit note. 

## 2016-02-01 ENCOUNTER — Other Ambulatory Visit: Payer: Self-pay | Admitting: Surgical

## 2016-02-01 LAB — URINE CULTURE

## 2016-02-01 MED ORDER — PHENAZOPYRIDINE HCL 200 MG PO TABS
200.0000 mg | ORAL_TABLET | Freq: Three times a day (TID) | ORAL | 0 refills | Status: DC | PRN
Start: 2016-02-01 — End: 2016-02-13

## 2016-02-07 ENCOUNTER — Other Ambulatory Visit: Payer: Medicare Other

## 2016-02-11 ENCOUNTER — Ambulatory Visit: Payer: Medicare Other

## 2016-02-11 ENCOUNTER — Encounter: Payer: Self-pay | Admitting: Internal Medicine

## 2016-02-11 ENCOUNTER — Ambulatory Visit (INDEPENDENT_AMBULATORY_CARE_PROVIDER_SITE_OTHER): Payer: Medicare Other | Admitting: Internal Medicine

## 2016-02-11 VITALS — BP 114/58 | HR 72 | Temp 98.1°F | Resp 16 | Ht 59.75 in | Wt 195.1 lb

## 2016-02-11 VITALS — BP 118/62 | HR 72 | Temp 98.1°F | Resp 16 | Ht 59.75 in | Wt 195.0 lb

## 2016-02-11 DIAGNOSIS — Z0000 Encounter for general adult medical examination without abnormal findings: Secondary | ICD-10-CM | POA: Diagnosis not present

## 2016-02-11 DIAGNOSIS — E119 Type 2 diabetes mellitus without complications: Secondary | ICD-10-CM

## 2016-02-11 DIAGNOSIS — E7800 Pure hypercholesterolemia, unspecified: Secondary | ICD-10-CM

## 2016-02-11 DIAGNOSIS — E669 Obesity, unspecified: Secondary | ICD-10-CM | POA: Diagnosis not present

## 2016-02-11 DIAGNOSIS — E039 Hypothyroidism, unspecified: Secondary | ICD-10-CM | POA: Diagnosis not present

## 2016-02-11 DIAGNOSIS — E66811 Obesity, class 1: Secondary | ICD-10-CM

## 2016-02-11 DIAGNOSIS — I10 Essential (primary) hypertension: Secondary | ICD-10-CM | POA: Diagnosis not present

## 2016-02-11 DIAGNOSIS — R140 Abdominal distension (gaseous): Secondary | ICD-10-CM

## 2016-02-11 DIAGNOSIS — Z23 Encounter for immunization: Secondary | ICD-10-CM

## 2016-02-11 DIAGNOSIS — E559 Vitamin D deficiency, unspecified: Secondary | ICD-10-CM | POA: Diagnosis not present

## 2016-02-11 DIAGNOSIS — N390 Urinary tract infection, site not specified: Secondary | ICD-10-CM

## 2016-02-11 LAB — HM DIABETES FOOT EXAM

## 2016-02-11 NOTE — Patient Instructions (Addendum)
  Ms. Veronica Branch , Thank you for taking time to come for your Medicare Wellness Visit. I appreciate your ongoing commitment to your health goals. Please review the following plan we discussed and let me know if I can assist you in the future.   Follow up with Dr. Lorin PicketScott as needed.  These are the goals we discussed: Goals    . Increase physical activity          Walk for exercise       This is a list of the screening recommended for you and due dates:  Health Maintenance  Topic Date Due  . DEXA scan (bone density measurement)  10/23/2006  . Complete foot exam   12/09/2013  . Pneumonia vaccines (2 of 2 - PPSV23) 12/09/2013  . Flu Shot  12/20/2016*  . Hemoglobin A1C  04/09/2016  . Eye exam for diabetics  06/20/2016  . Mammogram  10/30/2017  . Tetanus Vaccine  08/06/2022  . Colon Cancer Screening  08/26/2022  . Shingles Vaccine  Completed  *Topic was postponed. The date shown is not the original due date.

## 2016-02-11 NOTE — Progress Notes (Signed)
Patient ID: Veronica Branch, female   DOB: 08-03-1940, 76 y.o.   MRN: [id number]   Subjective:    Patient ID: Veronica Branch, female    DOB: 04/08/1940, 76 y.o.   MRN: [id number]  HPI  Patient here for a physical exam.  She has been having issues recently with dysuria and urinary frequency and urgency.  Initially went to [facility] with dysuria.  Diagnosed with UTI.  Treated with abx.  Symptoms improved, but did not completely resolve.  abx were extended.  She states that her pain has improved, but still with increased urinary urgency.  Goes frequently during the day with minimal urine at times.  Nocturia x 2.  She has tried pyridium with no relief.  Previously had diarrhea, but now states bowels are at her baseline.  She does report some increased bloating.  Some abdominal discomfort.  She is eating.  No nausea or vomiting.  No blood in her stool.  No weight loss.  She does report some indigesion/acid reflux.  Only takes her PPI 4-5 days per week.     Past Medical History:  Diagnosis Date  . Diverticulosis   . GERD (gastroesophageal reflux disease)   . Hiatal hernia   . History of migraine headaches   . Hypercholesterolemia   . Hyperglycemia   . Hypertension   . Hypothyroidism    s/p removal of right thyroid lobe and isthmus (1992)   Past Surgical History:  Procedure Laterality Date  . BREAST BIOPSY Right 2000   benign  . THYROID LOBECTOMY  1992   s/p removal of right thyroid lobe and isthmus   Family History  Problem Relation Age of Onset  . Coronary artery disease Mother     myocardial infarction and CHF  . Breast cancer Mother     63's  . Breast cancer Maternal Aunt     x 2  . Breast cancer Sister   . Breast cancer Cousin   . Colon cancer Neg Hx    Social History   Social History  . Marital status: Married    Spouse name: N/A  . Number of children: 2  . Years of education: N/A   Occupational History  . retired Pharmacist, hospital    Social History Main Topics  . Smoking  status: Never Smoker  . Smokeless tobacco: Never Used  . Alcohol use No  . Drug use: No  . Sexual activity: Yes   Other Topics Concern  . None   Social History Narrative   Regularly exercises. Retired and married.     Outpatient Encounter Prescriptions as of 02/11/2016  Medication Sig  . aspirin 81 MG tablet Take 81 mg by mouth daily.  . Blood Glucose Monitoring Suppl (ONE TOUCH ULTRA SYSTEM KIT) W/DEVICE KIT 1 kit by Does not apply route once.  . cefdinir (OMNICEF) 300 MG capsule Take 1 capsule (300 mg total) by mouth 2 (two) times daily.  Marland Kitchen ibuprofen (ADVIL,MOTRIN) 600 MG tablet Take 1 tablet (600 mg total) by mouth every 8 (eight) hours as needed.  . Lancets (ONETOUCH ULTRASOFT) lancets Use to test blood sugars 1-2 times daily   E11.9  . lisinopril (PRINIVIL,ZESTRIL) 10 MG tablet TAKE 1 TABLET BY MOUTH EVERY DAY  . omeprazole (PRILOSEC) 20 MG capsule TAKE 1 CAPSULE BY MOUTH EVERY DAY  . ONE TOUCH ULTRA TEST test strip TEST BLOOD SUGARS 1 TO 2 TIMES DAILY  . propranolol (INDERAL) 40 MG tablet TAKE 1 TABLET BY MOUTH TWICE A DAY  .  SYNTHROID 100 MCG tablet TAKE 1 TABLET BY MOUTH EVERY DAY  . traMADol (ULTRAM) 50 MG tablet Take 2 tablets (100 mg total) by mouth every 6 (six) hours as needed (moderate to severe pain).  . triamcinolone cream (KENALOG) 0.1 % Apply 1 application topically 2 (two) times daily.  . phenazopyridine (PYRIDIUM) 200 MG tablet Take 1 tablet (200 mg total) by mouth 3 (three) times daily as needed for pain.  . valACYclovir (VALTREX) 1000 MG tablet Take 1 tablet (1,000 mg total) by mouth 3 (three) times daily.   No facility-administered encounter medications on file as of 02/11/2016.     Review of Systems  Constitutional: Negative for fever and unexpected weight change.  HENT: Negative for congestion and sinus pressure.   Eyes: Negative for pain and visual disturbance.  Respiratory: Negative for cough, chest tightness and shortness of breath.   Cardiovascular:  Negative for chest pain, palpitations and leg swelling.  Gastrointestinal: Positive for abdominal pain. Negative for diarrhea, nausea and vomiting.  Genitourinary: Positive for dysuria, frequency and urgency.  Musculoskeletal: Negative for back pain and joint swelling.  Skin: Negative for color change and rash.  Neurological: Negative for dizziness, light-headedness and headaches.  Hematological: Negative for adenopathy. Does not bruise/bleed easily.  Psychiatric/Behavioral: Negative for agitation and dysphoric mood.       Objective:    Physical Exam  Constitutional: She is oriented to person, place, and time. She appears well-developed and well-nourished. No distress.  HENT:  Nose: Nose normal.  Mouth/Throat: Oropharynx is clear and moist.  Eyes: Right eye exhibits no discharge. Left eye exhibits no discharge. No scleral icterus.  Neck: Neck supple. No thyromegaly present.  Cardiovascular: Normal rate and regular rhythm.   Pulmonary/Chest: Breath sounds normal. No accessory muscle usage. No tachypnea. No respiratory distress. She has no decreased breath sounds. She has no wheezes. She has no rhonchi. Right breast exhibits no inverted nipple, no mass, no nipple discharge and no tenderness (no axillary adenopathy). Left breast exhibits no inverted nipple, no mass, no nipple discharge and no tenderness (no axilarry adenopathy).  Abdominal: Soft. Bowel sounds are normal.  Minimal tenderness to palpation over the mid to lower abdomen.    Musculoskeletal: She exhibits no edema or tenderness.  Lymphadenopathy:    She has no cervical adenopathy.  Neurological: She is alert and oriented to person, place, and time.  Skin: Skin is warm. No rash noted. No erythema.  Psychiatric: She has a normal mood and affect. Her behavior is normal.    BP (!) 114/58 (BP Location: Left Arm, Patient Position: Sitting, Cuff Size: Large)   Pulse 72   Temp 98.1 F (36.7 C) (Oral)   Resp 16   Ht 4' 11.75"  (1.518 m)   Wt 195 lb 2 oz (88.5 kg)   SpO2 96%   BMI 38.43 kg/m  Wt Readings from Last 3 Encounters:  02/11/16 195 lb (88.5 kg)  02/11/16 195 lb 2 oz (88.5 kg)  01/30/16 197 lb 3.2 oz (89.4 kg)     Lab Results  Component Value Date   WBC 9.0 12/21/2015   HGB 14.1 12/21/2015   HCT 41.8 12/21/2015   PLT 266.0 12/21/2015   GLUCOSE 111 (H) 10/11/2015   CHOL 157 10/11/2015   TRIG 86.0 10/11/2015   HDL 44.50 10/11/2015   LDLDIRECT 156.0 12/06/2012   LDLCALC 96 10/11/2015   ALT 18 10/11/2015   AST 15 10/11/2015   NA 142 10/11/2015   K 4.3 10/11/2015   CL  108 10/11/2015   CREATININE 0.71 10/11/2015   BUN 8 10/11/2015   CO2 29 10/11/2015   TSH 2.05 02/06/2015   HGBA1C 6.8 (H) 10/11/2015   MICROALBUR 1.0 06/07/2015    Mm Screening Breast Tomo Bilateral  Result Date: 11/01/2015 CLINICAL DATA:  Screening. EXAM: 2D DIGITAL SCREENING BILATERAL MAMMOGRAM WITH CAD AND ADJUNCT TOMO COMPARISON:  Previous exam(s). ACR Breast Density Category b: There are scattered areas of fibroglandular density. FINDINGS: There are no findings suspicious for malignancy. Images were processed with CAD. IMPRESSION: No mammographic evidence of malignancy. A result letter of this screening mammogram will be mailed directly to the patient. RECOMMENDATION: Screening mammogram in one year. (Code:SM-B-01Y) BI-RADS CATEGORY  1: Negative. Electronically Signed   By: Franki Cabot M.D.   On: 11/01/2015 09:07       Assessment & Plan:   Problem List Items Addressed This Visit    Abdominal bloating    Abdominal bloating.  Discussed at length with her today.  Discussed further w/up including CT scan.  She declines at this time.  Wants to see urology first.  She is also having some acid reflux and indigestion.  On omeprazole. Not taking daily.  Discussed the need to take daily.  States if she skips more than a few days of the medication, will have worsening symptoms.  Discussed the need for EGD.  She wants to hold at  this time.  Take prilosec daily.  Schedule f/u soon to reassess.        Diabetes ([place])    Last a1c improved 6.8.  Low carb diet and exercise.  Follow met b and a1c.  Discussed the need for eye exam.  States she is up to date.        Health care maintenance    Physical today 02/11/16.  Mammogram 11/01/15 - Birads I.  Colonoscopy 09/04/12.        Hypercholesterolemia    Low cholesterol diet and exercise.  Last LDL improved.  Follow.   Lab Results  Component Value Date   CHOL 157 10/11/2015   HDL 44.50 10/11/2015   LDLCALC 96 10/11/2015   LDLDIRECT 156.0 12/06/2012   TRIG 86.0 10/11/2015   CHOLHDL 4 10/11/2015        Hypertension    Blood pressure under good control.  Continue same medication regimen.  Follow pressures.  Follow metabolic panel.        Hypothyroidism    On thyroid replacement.  Follow tsh.        Obesity (BMI 30.0-34.9)    Diet and exercise.  Follow.       Recurrent UTI    Has had issues with urinary tract infections, dysuria and urgency.  Pain has improved.  With persistent urgency.  She is going frequently with minimal urine output at times.  Has appt with urology in two days for further evaluation and w/up.  Discussed concerns regarding urinary retention, etc. She has not noticed any gross hematuria, but has had blood in her urine on urine checks.  Discussed f/u urinalysis and further w/up for hematuria.  Has appt with urology as outlined.  Wants to hold on any further testing until urology evaluation.        Vitamin D deficiency    Follow vitamin D level.         Other Visit Diagnoses    Routine general medical examination at a health care facility    -  Primary   Encounter for immunization  Relevant Orders   Flu Vaccine QUAD 36+ mos IM (Completed)       Einar Pheasant, MD

## 2016-02-11 NOTE — Assessment & Plan Note (Signed)
Physical today 02/11/16.  Mammogram 11/01/15 - Birads I.  Colonoscopy 09/04/12.

## 2016-02-11 NOTE — Progress Notes (Signed)
Pre visit review using our clinic review tool, if applicable. No additional management support is needed unless otherwise documented below in the visit note. 

## 2016-02-11 NOTE — Progress Notes (Signed)
Subjective:   Veronica Branch is a 76 y.o. female who presents for an Initial Medicare Annual Wellness Visit.  Review of Systems    No ROS.  Medicare Wellness Visit.  Cardiac Risk Factors include: advanced age (>46mn, >>40women);hypertension;obesity (BMI >30kg/m2);diabetes mellitus     Objective:    Today's Vitals   02/11/16 1511  BP: 118/62  Pulse: 72  Resp: 16  Temp: 98.1 F (36.7 C)  TempSrc: Oral  SpO2: 96%  Weight: 195 lb (88.5 kg)  Height: 4' 11.75" (1.518 m)   Body mass index is 38.4 kg/m.   Current Medications (verified) Outpatient Encounter Prescriptions as of 02/11/2016  Medication Sig  . aspirin 81 MG tablet Take 81 mg by mouth daily.  . Blood Glucose Monitoring Suppl (ONE TOUCH ULTRA SYSTEM KIT) W/DEVICE KIT 1 kit by Does not apply route once.  . cefdinir (OMNICEF) 300 MG capsule Take 1 capsule (300 mg total) by mouth 2 (two) times daily.  .Marland Kitchenibuprofen (ADVIL,MOTRIN) 600 MG tablet Take 1 tablet (600 mg total) by mouth every 8 (eight) hours as needed.  . Lancets (ONETOUCH ULTRASOFT) lancets Use to test blood sugars 1-2 times daily   E11.9  . lisinopril (PRINIVIL,ZESTRIL) 10 MG tablet TAKE 1 TABLET BY MOUTH EVERY DAY  . omeprazole (PRILOSEC) 20 MG capsule TAKE 1 CAPSULE BY MOUTH EVERY DAY  . ONE TOUCH ULTRA TEST test strip TEST BLOOD SUGARS 1 TO 2 TIMES DAILY  . phenazopyridine (PYRIDIUM) 200 MG tablet Take 1 tablet (200 mg total) by mouth 3 (three) times daily as needed for pain.  .Marland Kitchenpropranolol (INDERAL) 40 MG tablet TAKE 1 TABLET BY MOUTH TWICE A DAY  . SYNTHROID 100 MCG tablet TAKE 1 TABLET BY MOUTH EVERY DAY  . traMADol (ULTRAM) 50 MG tablet Take 2 tablets (100 mg total) by mouth every 6 (six) hours as needed (moderate to severe pain).  . triamcinolone cream (KENALOG) 0.1 % Apply 1 application topically 2 (two) times daily.  . valACYclovir (VALTREX) 1000 MG tablet Take 1 tablet (1,000 mg total) by mouth 3 (three) times daily.   No facility-administered  encounter medications on file as of 02/11/2016.     Allergies (verified) Ciprofloxacin hcl; Penicillins; and Sulfa antibiotics   History: Past Medical History:  Diagnosis Date  . Diverticulosis   . GERD (gastroesophageal reflux disease)   . Hiatal hernia   . History of migraine headaches   . Hypercholesterolemia   . Hyperglycemia   . Hypertension   . Hypothyroidism    s/p removal of right thyroid lobe and isthmus (1992)   Past Surgical History:  Procedure Laterality Date  . BREAST BIOPSY Right 2000   benign  . THYROID LOBECTOMY  1992   s/p removal of right thyroid lobe and isthmus   Family History  Problem Relation Age of Onset  . Coronary artery disease Mother     myocardial infarction and CHF  . Breast cancer Mother     541's . Breast cancer Maternal Aunt     x 2  . Breast cancer Sister   . Breast cancer Cousin   . Colon cancer Neg Hx    Social History   Occupational History  . retired tPharmacist, hospital   Social History Main Topics  . Smoking status: Never Smoker  . Smokeless tobacco: Never Used  . Alcohol use No  . Drug use: No  . Sexual activity: Yes    Tobacco Counseling Counseling given: Not Answered   Activities  of Daily Living In your present state of health, do you have any difficulty performing the following activities: 02/11/2016  Hearing? N  Vision? N  Difficulty concentrating or making decisions? N  Walking or climbing stairs? N  Dressing or bathing? N  Doing errands, shopping? N  Preparing Food and eating ? N  Using the Toilet? N  In the past six months, have you accidently leaked urine? Y  Do you have problems with loss of bowel control? N  Managing your Medications? N  Managing your Finances? N  Housekeeping or managing your Housekeeping? N  Some recent data might be hidden    Immunizations and Health Maintenance Immunization History  Administered Date(s) Administered  . Influenza Split 11/25/2011  . Influenza,inj,Quad PF,36+ Mos  12/01/2012, 10/06/2013, 10/09/2014, 02/11/2016  . Pneumococcal Conjugate-13 12/09/2012  . Zoster 11/09/2013   Health Maintenance Due  Topic Date Due  . DEXA SCAN  10/23/2006  . FOOT EXAM  12/09/2013  . PNA vac Low Risk Adult (2 of 2 - PPSV23) 12/09/2013    Patient Care Team: Einar Pheasant, MD as PCP - General (Internal Medicine)  Indicate any recent Medical Services you may have received from other than [facility] providers in the past year (date may be approximate).     Assessment:   This is a routine wellness examination for [name]. The goal of the wellness visit is to assist the patient how to close the gaps in care and create a preventative care plan for the patient.   Osteoporosis risk reviewed.  DEXA SCAN deferred per patient request.    Medications reviewed; taking without issues or barriers.  Safety issues reviewed; lives with husband.  Smoke detectors in the home. No firearms in the home. Wears seatbelts when driving or riding with others. No violence in the home.  No identified risk were noted; The patient was oriented x 3; appropriate in dress and manner and no objective failures at ADL's or IADL's.   BMI; discussed the importance of a healthy diet, water intake and exercise. Educational material provided.  HTN; followed by PCP.  Pneumovax 23 vaccine deferred per patient request.  Educational material provided.  Health maintenance gaps; closed.  Patient Concerns: None at this time. Follow up with PCP as needed.  Hearing/Vision screen Hearing Screening Comments: Patient passes the whisper test Vision Screening Comments: Followed by Dr. Gloriann Loan Last OV 10/2015 Wears glasses No retinopathy reported  Visual acuity not assessed per patient preference since she has regular follow up with her ophthalmologist.  Dietary issues and exercise activities discussed: Current Exercise Habits: Home exercise routine, Type of exercise: walking;stretching, Time (Minutes): 10,  Frequency (Times/Week): 1, Weekly Exercise (Minutes/Week): 10, Intensity: Mild  Goals    . Increase physical activity          Walk for exercise      Depression Screen PHQ 2/9 Scores 12/21/2015 10/16/2015 02/08/2015 10/09/2014 07/19/2013 03/21/2012 11/27/2011  PHQ - 2 Score 0 0 0 0 0 0 0    Fall Risk Fall Risk  12/21/2015 10/16/2015 02/08/2015 10/09/2014 07/19/2013  Falls in the past year? No No No No No    Cognitive Function:     6CIT Screen 02/11/2016  What Year? 0 points  What month? 0 points  What time? 0 points  Count back from 20 0 points  Months in reverse 0 points  Repeat phrase 0 points  Total Score 0    Screening Tests Health Maintenance  Topic Date Due  . DEXA SCAN  10/23/2006  . FOOT EXAM  12/09/2013  . PNA vac Low Risk Adult (2 of 2 - PPSV23) 12/09/2013  . INFLUENZA VACCINE  12/20/2016 (Originally 08/21/2015)  . HEMOGLOBIN A1C  04/09/2016  . OPHTHALMOLOGY EXAM  06/20/2016  . MAMMOGRAM  10/30/2017  . TETANUS/TDAP  08/06/2022  . COLONOSCOPY  08/26/2022  . ZOSTAVAX  Completed      Plan:    End of life planning; Advance aging; Advanced directives discussed. Copy of current HCPOA/Living Will requested.  Medicare Attestation I have personally reviewed: The patient's medical and social history Their use of alcohol, tobacco or illicit drugs Their current medications and supplements The patient's functional ability including ADLs,fall risks, home safety risks, cognitive, and hearing and visual impairment Diet and physical activities Evidence for depression   The patient's weight, height, BMI, and visual acuity have been recorded in the chart.  I have made referrals and provided education to the patient based on review of the above and I have provided the patient with a written personalized care plan for preventive services.    During the course of the visit, Aren was educated and counseled about the following appropriate screening and preventive services:    Vaccines to include Pneumoccal, Influenza, Hepatitis B, Td, Zostavax, HCV  Electrocardiogram  Cardiovascular disease screening  Colorectal cancer screening  Bone density screening  Diabetes screening  Glaucoma screening  Mammography/PAP  Nutrition counseling  Smoking cessation counseling  Patient Instructions (the written plan) were given to the patient.    Varney Biles, LPN   9/74/1638    Reviewed above information.  Agree with plan.  Dr Nicki Reaper

## 2016-02-12 ENCOUNTER — Encounter: Payer: Self-pay | Admitting: Internal Medicine

## 2016-02-12 NOTE — Assessment & Plan Note (Signed)
Blood pressure under good control.  Continue same medication regimen.  Follow pressures.  Follow metabolic panel.   

## 2016-02-12 NOTE — Assessment & Plan Note (Signed)
Abdominal bloating.  Discussed at length with her today.  Discussed further w/up including CT scan.  She declines at this time.  Wants to see urology first.  She is also having some acid reflux and indigestion.  On omeprazole. Not taking daily.  Discussed the need to take daily.  States if she skips more than a few days of the medication, will have worsening symptoms.  Discussed the need for EGD.  She wants to hold at this time.  Take prilosec daily.  Schedule f/u soon to reassess.

## 2016-02-12 NOTE — Assessment & Plan Note (Signed)
Diet and exercise.  Follow.  

## 2016-02-12 NOTE — Assessment & Plan Note (Signed)
Has had issues with urinary tract infections, dysuria and urgency.  Pain has improved.  With persistent urgency.  She is going frequently with minimal urine output at times.  Has appt with urology in two days for further evaluation and w/up.  Discussed concerns regarding urinary retention, etc. She has not noticed any gross hematuria, but has had blood in her urine on urine checks.  Discussed f/u urinalysis and further w/up for hematuria.  Has appt with urology as outlined.  Wants to hold on any further testing until urology evaluation.

## 2016-02-12 NOTE — Assessment & Plan Note (Signed)
Low cholesterol diet and exercise.  Last LDL improved.  Follow.   Lab Results  Component Value Date   CHOL 157 10/11/2015   HDL 44.50 10/11/2015   LDLCALC 96 10/11/2015   LDLDIRECT 156.0 12/06/2012   TRIG 86.0 10/11/2015   CHOLHDL 4 10/11/2015

## 2016-02-12 NOTE — Assessment & Plan Note (Signed)
Last a1c improved 6.8.  Low carb diet and exercise.  Follow met b and a1c.  Discussed the need for eye exam.  States she is up to date.

## 2016-02-12 NOTE — Assessment & Plan Note (Signed)
Follow vitamin D level.  

## 2016-02-12 NOTE — Assessment & Plan Note (Signed)
On thyroid replacement.  Follow tsh.  

## 2016-02-13 ENCOUNTER — Ambulatory Visit: Payer: Medicare Other | Admitting: Urology

## 2016-02-13 VITALS — BP 111/73 | HR 72 | Ht 60.0 in | Wt 195.2 lb

## 2016-02-13 DIAGNOSIS — N390 Urinary tract infection, site not specified: Secondary | ICD-10-CM | POA: Diagnosis not present

## 2016-02-13 LAB — URINALYSIS, COMPLETE
BILIRUBIN UA: NEGATIVE
Glucose, UA: NEGATIVE
KETONES UA: NEGATIVE
Nitrite, UA: NEGATIVE
PROTEIN UA: NEGATIVE
Specific Gravity, UA: 1.015 (ref 1.005–1.030)
Urobilinogen, Ur: 0.2 mg/dL (ref 0.2–1.0)
pH, UA: 5.5 (ref 5.0–7.5)

## 2016-02-13 LAB — MICROSCOPIC EXAMINATION
Epithelial Cells (non renal): >10 /hpf — AB (ref 0–10)
WBC, UA: >30 /hpf — AB (ref 0–?)

## 2016-02-13 LAB — BLADDER SCAN AMB NON-IMAGING: Scan Result: 32

## 2016-02-13 NOTE — Progress Notes (Signed)
Bladder Scan Patient void: 32 ml Performed By: Theotis BurrowK,russell,CMA

## 2016-02-13 NOTE — Progress Notes (Signed)
02/13/2016 2:52 PM   Veronica Branch November 27, 1940 [id number]  Referring provider: Einar Pheasant, MD [address] [place], [place] [address]  Chief Complaint  Patient presents with  . New Patient (Initial Visit)    recurrent UTI     HPI: I was consulted to assist the patient's recurrent urinary tract infections. Since Christmas she's been treated twice and may or may not have had one negative culture. She is small-volume frequency. She had pain with urination. The symptoms responded to antibiotics and she may have had a second course. She saw Dr. Jacqlyn Larsen 15 years ago for similar    She voids every 30-60 minutes and cannot hold for 2 hours at baseline. She gets up twice at night. She describes he in the dorsal syndrome and she also leaks when she goes from a sitting to standing position. She has urge incontinence. Last week she was having bedwetting. She denies stress incontinence. She does not wear a pad at home but wears 2 pads a day sometimes soak in when she is out in public especially with key in the dorsal syndrome  She is a diet-controlled diabetic. She is not a previous GU surgery. She has no neurologic issues. She's not had a kidney stone. She wondered is some diarrhea caused her infections  Modifying factors: There are no other modifying factors  Associated signs and symptoms: There are no other associated signs and symptoms Aggravating and relieving factors: There are no other aggravating or relieving factors Severity: Moderate Duration: Persistent   PMH: Past Medical History:  Diagnosis Date  . Diverticulosis   . GERD (gastroesophageal reflux disease)   . Hiatal hernia   . History of migraine headaches   . Hypercholesterolemia   . Hyperglycemia   . Hypertension   . Hypothyroidism    s/p removal of right thyroid lobe and isthmus (1992)    Surgical History: Past Surgical History:  Procedure Laterality Date  . BREAST BIOPSY Right 2000   benign  . THYROID LOBECTOMY  1992   s/p removal of right thyroid lobe and isthmus    Home Medications:  Allergies as of 02/13/2016      Reactions   Ciprofloxacin Hcl Other (See Comments)   tendonitis   Penicillins Hives   Urticaria   Sulfa Antibiotics Rash      Medication List       Accurate as of 02/13/16  2:52 PM. Always use your most recent med list.          aspirin 81 MG tablet Take 81 mg by mouth daily.   cefdinir 300 MG capsule Commonly known as:  OMNICEF Take 1 capsule (300 mg total) by mouth 2 (two) times daily.   lisinopril 10 MG tablet Commonly known as:  PRINIVIL,ZESTRIL TAKE 1 TABLET BY MOUTH EVERY DAY   omeprazole 20 MG capsule Commonly known as:  PRILOSEC TAKE 1 CAPSULE BY MOUTH EVERY DAY   ONE TOUCH ULTRA SYSTEM KIT w/Device Kit 1 kit by Does not apply route once.   ONE TOUCH ULTRA TEST test strip Generic drug:  glucose blood TEST BLOOD SUGARS 1 TO 2 TIMES DAILY   onetouch ultrasoft lancets Use to test blood sugars 1-2 times daily   E11.9   propranolol 40 MG tablet Commonly known as:  INDERAL TAKE 1 TABLET BY MOUTH TWICE A DAY   SYNTHROID 100 MCG tablet Generic drug:  levothyroxine TAKE 1 TABLET BY MOUTH EVERY DAY   triamcinolone cream 0.1 % Commonly known as:  KENALOG Apply 1 application topically 2 (two) times daily.   valACYclovir 1000 MG tablet Commonly known as:  VALTREX Take 1 tablet (1,000 mg total) by mouth 3 (three) times daily.       Allergies:  Allergies  Allergen Reactions  . Ciprofloxacin Hcl Other (See Comments)    tendonitis  . Penicillins Hives    Urticaria   . Sulfa Antibiotics Rash    Family History: Family History  Problem Relation Age of Onset  . Coronary artery disease Mother     myocardial infarction and CHF  . Breast cancer Mother     35's  . Breast cancer Maternal Aunt     x 2  . Breast cancer Sister   . Breast cancer Cousin   . Colon cancer Neg Hx     Social History:  reports that she  has never smoked. She has never used smokeless tobacco. She reports that she does not drink alcohol or use drugs.  ROS: UROLOGY Frequent Urination?: Yes Hard to postpone urination?: Yes Burning/pain with urination?: No Get up at night to urinate?: Yes Leakage of urine?: Yes Urine stream starts and stops?: Yes Trouble starting stream?: Yes Do you have to strain to urinate?: No Blood in urine?: No Urinary tract infection?: Yes Sexually transmitted disease?: No Injury to kidneys or bladder?: No Painful intercourse?: No Weak stream?: No Currently pregnant?: No Vaginal bleeding?: No Last menstrual period?: n  Gastrointestinal Nausea?: No Vomiting?: No Indigestion/heartburn?: Yes Diarrhea?: No Constipation?: No  Constitutional Fever: No Night sweats?: No Weight loss?: No Fatigue?: No  Skin Skin rash/lesions?: No Itching?: No  Eyes Blurred vision?: No Double vision?: No  Ears/Nose/Throat Sore throat?: No Sinus problems?: No  Hematologic/Lymphatic Swollen glands?: No Easy bruising?: No  Cardiovascular Leg swelling?: No Chest pain?: No  Respiratory Cough?: No Shortness of breath?: No  Endocrine Excessive thirst?: No  Musculoskeletal Back pain?: Yes Joint pain?: No  Neurological Headaches?: Yes Dizziness?: No  Psychologic Depression?: No Anxiety?: No  Physical Exam: BP 111/73   Pulse 72   Ht 5' (1.524 m)   Wt 195 lb 3.2 oz (88.5 kg)   BMI 38.12 kg/m   Constitutional:  Alert and oriented, No acute distress. HEENT: [place] AT, moist mucus membranes.  Trachea midline, no masses. Cardiovascular: No clubbing, cyanosis, or edema. Respiratory: Normal respiratory effort, no increased work of breathing. GI: Abdomen is soft, nontender, nondistended, no abdominal masses GU: No CVA tenderness. Moderate vaginal atrophy. Grade 1 cystocele. Grade 1 hypermobility of the bladder neck with a mild positive cough test Skin: No rashes, bruises or suspicious  lesions. Lymph: No cervical or inguinal adenopathy. Neurologic: Grossly intact, no focal deficits, moving all 4 extremities. Psychiatric: Normal mood and affect.  Laboratory Data: Lab Results  Component Value Date   WBC 9.0 12/21/2015   HGB 14.1 12/21/2015   HCT 41.8 12/21/2015   MCV 87.6 12/21/2015   PLT 266.0 12/21/2015    Lab Results  Component Value Date   CREATININE 0.71 10/11/2015    No results found for: PSA  No results found for: TESTOSTERONE  Lab Results  Component Value Date   HGBA1C 6.8 (H) 10/11/2015    Urinalysis    Component Value Date/Time   COLORURINE YELLOW (A) 07/21/2015 1039   APPEARANCEUR CLEAR (A) 07/21/2015 1039   LABSPEC 1.012 07/21/2015 1039   PHURINE 7.0 07/21/2015 1039   GLUCOSEU NEGATIVE 07/21/2015 1039   HGBUR 2+ (A) 07/21/2015 1039   BILIRUBINUR negative 01/30/2016 1141   KETONESUR  negative 01/30/2016 1141   KETONESUR NEGATIVE 07/21/2015 1039   PROTEINUR negative 01/30/2016 1141   PROTEINUR NEGATIVE 07/21/2015 1039   UROBILINOGEN 0.2 01/30/2016 1141   NITRITE Negative 01/30/2016 1141   NITRITE NEGATIVE 07/21/2015 1039   LEUKOCYTESUR small (1+) (A) 01/30/2016 1141    Pertinent Imaging: CT scan July 2017 was normal  Assessment & Plan:  The patient has chronic cystitis. She has urgency incontinence with typical triggers. At one point in time she was having some bedwetting. She voids very frequently and has mild nocturia. She hopes not to be treated for her overactive bladder with medication.  The patient was not clinically infected today but I sent her urine for culture. I thought it was reasonable to recommend prophylaxis recognizing she had a normal kidney x-ray 7 months ago. I will only perform cystoscopy if she fails prophylaxis. Her incontinence may down regulate some on prophylaxis. She may wish to live with her level of bladder dysfunction and noted she did not want to take medication   The patient was a little bit reluctant to  take any medication. Her incontinence dates back to a year ago but I really think things have gotten much worse in the last few months with the or significant overactive bladder component  I will reassess her in 6 weeks on daily trimethoprim  1. Recurrent UTI 2. Mixed incontinence   - Urinalysis, Complete - Bladder Scan (Post Void Residual) in office   No Follow-up on file.  Reece Packer, MD  [facility] [address], [place],  [address] [phone]

## 2016-02-14 ENCOUNTER — Telehealth: Payer: Self-pay | Admitting: Urology

## 2016-02-14 ENCOUNTER — Other Ambulatory Visit: Payer: Self-pay

## 2016-02-14 DIAGNOSIS — N390 Urinary tract infection, site not specified: Secondary | ICD-10-CM

## 2016-02-14 MED ORDER — TRIMETHOPRIM 100 MG PO TABS: 100.0000 mg | ORAL_TABLET | Freq: Every day | ORAL | 11 refills | Status: DC

## 2016-02-14 NOTE — Telephone Encounter (Signed)
Pt called office stating she was seen yesterday by Dr. Sherron MondayMacDiarmid, pt states that Dr was "calling in" a medication, as of 7 oclock last night and this morning the medication has not been called in. Pt wants to know why it's taking so long and when will this be "called In" Please advise.

## 2016-02-14 NOTE — Telephone Encounter (Signed)
Pt called again today and Huntley DecSara checked chart.  Rx hadn't been sent, so she sent it.

## 2016-02-15 LAB — CULTURE, URINE COMPREHENSIVE

## 2016-02-19 ENCOUNTER — Other Ambulatory Visit (INDEPENDENT_AMBULATORY_CARE_PROVIDER_SITE_OTHER): Payer: Medicare Other

## 2016-02-19 DIAGNOSIS — E119 Type 2 diabetes mellitus without complications: Secondary | ICD-10-CM

## 2016-02-19 DIAGNOSIS — R7989 Other specified abnormal findings of blood chemistry: Secondary | ICD-10-CM | POA: Diagnosis not present

## 2016-02-19 DIAGNOSIS — R945 Abnormal results of liver function studies: Secondary | ICD-10-CM

## 2016-02-19 DIAGNOSIS — E7800 Pure hypercholesterolemia, unspecified: Secondary | ICD-10-CM

## 2016-02-19 DIAGNOSIS — I10 Essential (primary) hypertension: Secondary | ICD-10-CM | POA: Diagnosis not present

## 2016-02-19 DIAGNOSIS — E039 Hypothyroidism, unspecified: Secondary | ICD-10-CM

## 2016-02-19 LAB — LIPID PANEL
CHOLESTEROL: 210 mg/dL — AB (ref 0–200)
HDL: 48.3 mg/dL (ref >39.00)
LDL Cholesterol: 132 mg/dL — ABNORMAL HIGH (ref 0–99)
NonHDL: 162.16
Total CHOL/HDL Ratio: 4
Triglycerides: 149 mg/dL (ref 0.0–149.0)
VLDL: 29.8 mg/dL (ref 0.0–40.0)

## 2016-02-19 LAB — BASIC METABOLIC PANEL
BUN: 10 mg/dL (ref 6–23)
CALCIUM: 9.3 mg/dL (ref 8.4–10.5)
CO2: 29 mEq/L (ref 19–32)
CREATININE: 0.84 mg/dL (ref 0.40–1.20)
Chloride: 106 mEq/L (ref 96–112)
GFR: 70.38 mL/min (ref >60.00)
Glucose, Bld: 128 mg/dL — ABNORMAL HIGH (ref 70–99)
Potassium: 4.5 mEq/L (ref 3.5–5.1)
Sodium: 140 mEq/L (ref 135–145)

## 2016-02-19 LAB — HEMOGLOBIN A1C: Hgb A1c MFr Bld: 7.2 % — ABNORMAL HIGH (ref 4.6–6.5)

## 2016-02-19 LAB — HEPATIC FUNCTION PANEL
ALBUMIN: 4.3 g/dL (ref 3.5–5.2)
ALT: 18 U/L (ref 0–35)
AST: 5 U/L (ref 0–37)
Alkaline Phosphatase: 85 U/L (ref 39–117)
Bilirubin, Direct: 0.1 mg/dL (ref 0.0–0.3)
TOTAL PROTEIN: 7.1 g/dL (ref 6.0–8.3)
Total Bilirubin: 0.6 mg/dL (ref 0.2–1.2)

## 2016-02-19 LAB — TSH: TSH: 3.82 u[IU]/mL (ref 0.35–4.50)

## 2016-02-20 ENCOUNTER — Encounter: Payer: Self-pay | Admitting: *Deleted

## 2016-02-29 ENCOUNTER — Ambulatory Visit: Payer: Medicare Other | Admitting: Internal Medicine

## 2016-03-08 ENCOUNTER — Other Ambulatory Visit: Payer: Self-pay | Admitting: Internal Medicine

## 2016-03-17 ENCOUNTER — Ambulatory Visit (INDEPENDENT_AMBULATORY_CARE_PROVIDER_SITE_OTHER): Payer: Medicare Other | Admitting: Internal Medicine

## 2016-03-17 ENCOUNTER — Encounter: Payer: Self-pay | Admitting: Internal Medicine

## 2016-03-17 DIAGNOSIS — E119 Type 2 diabetes mellitus without complications: Secondary | ICD-10-CM | POA: Diagnosis not present

## 2016-03-17 DIAGNOSIS — E7800 Pure hypercholesterolemia, unspecified: Secondary | ICD-10-CM

## 2016-03-17 DIAGNOSIS — I10 Essential (primary) hypertension: Secondary | ICD-10-CM

## 2016-03-17 DIAGNOSIS — E669 Obesity, unspecified: Secondary | ICD-10-CM

## 2016-03-17 DIAGNOSIS — E039 Hypothyroidism, unspecified: Secondary | ICD-10-CM

## 2016-03-17 DIAGNOSIS — N390 Urinary tract infection, site not specified: Secondary | ICD-10-CM | POA: Diagnosis not present

## 2016-03-17 DIAGNOSIS — E559 Vitamin D deficiency, unspecified: Secondary | ICD-10-CM | POA: Diagnosis not present

## 2016-03-17 NOTE — Progress Notes (Signed)
Patient ID: Veronica Branch, female   DOB: [birth year], 76 y.o.   MRN: [id number]   Subjective:    Patient ID: Veronica Branch, female    DOB: 23-Feb-1940, 76 y.o.   MRN: [id number]  HPI  Patient here for a scheduled follow up.  She reports she is doing well.  Has been watching her diet better.  Brought in recorded sugar readings.  Am sugars averaging 115-140 and pm sugars averaging 120-180.  Discussed diet and exercise.  No chest pain.  Breathing stable.  No acid reflux.  No abdominal pain or cramping.  Bowels stable.  Has been having issues with recurring uti's.  Saw urology.  On trimethoprim.  Discussed taking a probiotic.     Past Medical History:  Diagnosis Date  . Diverticulosis   . GERD (gastroesophageal reflux disease)   . Hiatal hernia   . History of migraine headaches   . Hypercholesterolemia   . Hyperglycemia   . Hypertension   . Hypothyroidism    s/p removal of right thyroid lobe and isthmus (1992)   Past Surgical History:  Procedure Laterality Date  . BREAST BIOPSY Right 2000   benign  . THYROID LOBECTOMY  1992   s/p removal of right thyroid lobe and isthmus   Family History  Problem Relation Age of Onset  . Coronary artery disease Mother     myocardial infarction and CHF  . Breast cancer Mother     51's  . Breast cancer Maternal Aunt     x 2  . Breast cancer Sister   . Breast cancer Cousin   . Colon cancer Neg Hx    Social History   Social History  . Marital status: Married    Spouse name: N/A  . Number of children: 2  . Years of education: N/A   Occupational History  . retired Pharmacist, hospital    Social History Main Topics  . Smoking status: Never Smoker  . Smokeless tobacco: Never Used  . Alcohol use No  . Drug use: No  . Sexual activity: Yes   Other Topics Concern  . None   Social History Narrative   Regularly exercises. Retired and married.     Outpatient Encounter Prescriptions as of 03/17/2016  Medication Sig  . aspirin 81 MG tablet Take  81 mg by mouth daily.  . Blood Glucose Monitoring Suppl (ONE TOUCH ULTRA SYSTEM KIT) W/DEVICE KIT 1 kit by Does not apply route once.  . Lancets (ONETOUCH ULTRASOFT) lancets Use to test blood sugars 1-2 times daily   E11.9  . lisinopril (PRINIVIL,ZESTRIL) 10 MG tablet TAKE 1 TABLET BY MOUTH EVERY DAY  . omeprazole (PRILOSEC) 20 MG capsule TAKE 1 CAPSULE BY MOUTH EVERY DAY  . ONE TOUCH ULTRA TEST test strip TEST BLOOD SUGARS 1 TO 2 TIMES DAILY  . propranolol (INDERAL) 40 MG tablet TAKE 1 TABLET BY MOUTH TWICE A DAY  . SYNTHROID 100 MCG tablet TAKE 1 TABLET BY MOUTH EVERY DAY  . triamcinolone cream (KENALOG) 0.1 % Apply 1 application topically 2 (two) times daily.  Marland Kitchen trimethoprim (TRIMPEX) 100 MG tablet Take 1 tablet (100 mg total) by mouth daily. (Patient not taking: Reported on 03/24/2016)  . [DISCONTINUED] valACYclovir (VALTREX) 1000 MG tablet Take 1 tablet (1,000 mg total) by mouth 3 (three) times daily. (Patient not taking: Reported on 03/24/2016)  . cefdinir (OMNICEF) 300 MG capsule Take 1 capsule (300 mg total) by mouth 2 (two) times daily. (Patient not taking: Reported on 03/24/2016)  No facility-administered encounter medications on file as of 03/17/2016.     Review of Systems  Constitutional: Negative for appetite change and unexpected weight change.  HENT: Negative for congestion and sinus pressure.   Respiratory: Negative for cough, chest tightness and shortness of breath.   Cardiovascular: Negative for chest pain, palpitations and leg swelling.  Gastrointestinal: Negative for abdominal pain, diarrhea, nausea and vomiting.  Genitourinary: Negative for difficulty urinating and dysuria.  Musculoskeletal: Negative for back pain and joint swelling.  Skin: Negative for color change and rash.  Neurological: Negative for dizziness, light-headedness and headaches.  Psychiatric/Behavioral: Negative for agitation and dysphoric mood.       Objective:    Physical Exam  Constitutional: She  appears well-developed and well-nourished. No distress.  HENT:  Nose: Nose normal.  Mouth/Throat: Oropharynx is clear and moist.  Neck: Neck supple. No thyromegaly present.  Cardiovascular: Normal rate and regular rhythm.   Pulmonary/Chest: Breath sounds normal. No respiratory distress. She has no wheezes.  Abdominal: Soft. Bowel sounds are normal. There is no tenderness.  Musculoskeletal: She exhibits no edema or tenderness.  Lymphadenopathy:    She has no cervical adenopathy.  Skin: No rash noted. No erythema.  Psychiatric: She has a normal mood and affect. Her behavior is normal.    BP 116/68 (BP Location: Left Arm, Patient Position: Sitting, Cuff Size: Large)   Pulse 97   Temp 97.4 F (36.3 C) (Oral)   Resp 16   Ht 5' (1.524 m)   Wt 191 lb 3.2 oz (86.7 kg)   SpO2 95%   BMI 37.34 kg/m  Wt Readings from Last 3 Encounters:  03/24/16 192 lb 14.4 oz (87.5 kg)  03/17/16 191 lb 3.2 oz (86.7 kg)  02/13/16 195 lb 3.2 oz (88.5 kg)     Lab Results  Component Value Date   WBC 9.0 12/21/2015   HGB 14.1 12/21/2015   HCT 41.8 12/21/2015   PLT 266.0 12/21/2015   GLUCOSE 128 (H) 02/19/2016   CHOL 210 (H) 02/19/2016   TRIG 149.0 02/19/2016   HDL 48.30 02/19/2016   LDLDIRECT 156.0 12/06/2012   LDLCALC 132 (H) 02/19/2016   ALT 18 02/19/2016   AST 5 02/19/2016   NA 140 02/19/2016   K 4.5 02/19/2016   CL 106 02/19/2016   CREATININE 0.84 02/19/2016   BUN 10 02/19/2016   CO2 29 02/19/2016   TSH 3.82 02/19/2016   HGBA1C 7.2 (H) 02/19/2016   MICROALBUR 1.0 06/07/2015    Mm Screening Breast Tomo Bilateral  Result Date: 11/01/2015 CLINICAL DATA:  Screening. EXAM: 2D DIGITAL SCREENING BILATERAL MAMMOGRAM WITH CAD AND ADJUNCT TOMO COMPARISON:  Previous exam(s). ACR Breast Density Category b: There are scattered areas of fibroglandular density. FINDINGS: There are no findings suspicious for malignancy. Images were processed with CAD. IMPRESSION: No mammographic evidence of  malignancy. A result letter of this screening mammogram will be mailed directly to the patient. RECOMMENDATION: Screening mammogram in one year. (Code:SM-B-01Y) BI-RADS CATEGORY  1: Negative. Electronically Signed   By: Bary Richard M.D.   On: 11/01/2015 09:07       Assessment & Plan:   Problem List Items Addressed This Visit    Diabetes (HCC)    Sugars as outlined.  Improved.  Continue diet and exercise.  Follow met b and a1c.       Relevant Orders   Hemoglobin A1c   Basic metabolic panel   Microalbumin / creatinine urine ratio   Hypercholesterolemia    Low cholesterol  diet and exercise.  Follow lipid panel.  She desires not to take medication.       Relevant Orders   Hepatic function panel   Lipid panel   Hypertension    Blood pressure under good control.  Continue same medication regimen.  Follow pressures.  Follow metabolic panel.        Hypothyroidism    On thyroid replacement.  Follow tsh.       Obesity (BMI 30.0-34.9)    Diet and exercise.        Recurrent UTI    On prophylaxis now.  Seeing urology.  Stable.        Vitamin D deficiency    Follow vitamin D level.           Einar Pheasant, MD

## 2016-03-17 NOTE — Progress Notes (Signed)
Pre-visit discussion using our clinic review tool. No additional management support is needed unless otherwise documented below in the visit note.  

## 2016-03-24 ENCOUNTER — Ambulatory Visit: Payer: Medicare Other | Admitting: Urology

## 2016-03-24 ENCOUNTER — Encounter: Payer: Self-pay | Admitting: Urology

## 2016-03-24 VITALS — BP 114/75 | HR 54 | Ht 60.0 in | Wt 192.9 lb

## 2016-03-24 DIAGNOSIS — N390 Urinary tract infection, site not specified: Secondary | ICD-10-CM

## 2016-03-24 MED ORDER — NITROFURANTOIN MACROCRYSTAL 100 MG PO CAPS: 100.0000 mg | ORAL_CAPSULE | Freq: Every day | ORAL | 11 refills | Status: DC

## 2016-03-24 NOTE — Progress Notes (Signed)
03/24/2016 2:16 PM   Veronica Branch Aug 18, 1940 [id number]  Referring provider: Einar Pheasant, MD [address] [place], [place] [address]  Chief Complaint  Patient presents with  . Follow-up    recurrent UTI    HPI: The patient has chronic cystitis. She has urgency incontinence with typical triggers. At one point in time she was having some bedwetting. She voids very frequently and has mild nocturia. She hopes not to be treated for her overactive bladder with medication.  The patient was not clinically infected today but I sent her urine for culture. I thought it was reasonable to recommend prophylaxis recognizing she had a normal kidney x-ray 7 months ago. I will only perform cystoscopy if she fails prophylaxis. Her incontinence may down regulate some on prophylaxis. She may wish to live with her level of bladder dysfunction and noted she did not want to take medication   The patient was a little bit reluctant to take any medication. Her incontinence dates back to a year ago but I really think things have gotten much worse in the last few months with the or significant overactive bladder component. Given Triprim  Today  Clinically the patient is infection free on trimethoprim. She thinks it may have caused loose bowel movements that coming go. Her discomfort and urgency and frequency are much better.  We once again talked about prophylaxis and safety   PMH: Past Medical History:  Diagnosis Date  . Diverticulosis   . GERD (gastroesophageal reflux disease)   . Hiatal hernia   . History of migraine headaches   . Hypercholesterolemia   . Hyperglycemia   . Hypertension   . Hypothyroidism    s/p removal of right thyroid lobe and isthmus (1992)    Surgical History: Past Surgical History:  Procedure Laterality Date  . BREAST BIOPSY Right 2000   benign  . THYROID LOBECTOMY  1992   s/p removal of right thyroid lobe and isthmus    Home Medications:   Allergies as of 03/24/2016      Reactions   Ciprofloxacin Hcl Other (See Comments)   tendonitis   Penicillins Hives   Urticaria   Sulfa Antibiotics Rash      Medication List       Accurate as of 03/24/16  2:16 PM. Always use your most recent med list.          aspirin 81 MG tablet Take 81 mg by mouth daily.   cefdinir 300 MG capsule Commonly known as:  OMNICEF Take 1 capsule (300 mg total) by mouth 2 (two) times daily.   lisinopril 10 MG tablet Commonly known as:  PRINIVIL,ZESTRIL TAKE 1 TABLET BY MOUTH EVERY DAY   nitrofurantoin 100 MG capsule Commonly known as:  MACRODANTIN Take 1 capsule (100 mg total) by mouth at bedtime.   omeprazole 20 MG capsule Commonly known as:  PRILOSEC TAKE 1 CAPSULE BY MOUTH EVERY DAY   ONE TOUCH ULTRA SYSTEM KIT w/Device Kit 1 kit by Does not apply route once.   ONE TOUCH ULTRA TEST test strip Generic drug:  glucose blood TEST BLOOD SUGARS 1 TO 2 TIMES DAILY   onetouch ultrasoft lancets Use to test blood sugars 1-2 times daily   E11.9   propranolol 40 MG tablet Commonly known as:  INDERAL TAKE 1 TABLET BY MOUTH TWICE A DAY   SYNTHROID 100 MCG tablet Generic drug:  levothyroxine TAKE 1 TABLET BY MOUTH EVERY DAY   triamcinolone cream 0.1 % Commonly known  as:  KENALOG Apply 1 application topically 2 (two) times daily.   trimethoprim 100 MG tablet Commonly known as:  TRIMPEX Take 1 tablet (100 mg total) by mouth daily.       Allergies:  Allergies  Allergen Reactions  . Ciprofloxacin Hcl Other (See Comments)    tendonitis  . Penicillins Hives    Urticaria   . Sulfa Antibiotics Rash    Family History: Family History  Problem Relation Age of Onset  . Coronary artery disease Mother     myocardial infarction and CHF  . Breast cancer Mother     7's  . Breast cancer Maternal Aunt     x 2  . Breast cancer Sister   . Breast cancer Cousin   . Colon cancer Neg Hx     Social History:  reports that she has never  smoked. She has never used smokeless tobacco. She reports that she does not drink alcohol or use drugs.  ROS: UROLOGY Frequent Urination?: Yes Hard to postpone urination?: Yes Burning/pain with urination?: No Get up at night to urinate?: No Leakage of urine?: Yes Urine stream starts and stops?: No Trouble starting stream?: No Do you have to strain to urinate?: No Blood in urine?: No Urinary tract infection?: No Sexually transmitted disease?: No Injury to kidneys or bladder?: No Painful intercourse?: No Weak stream?: No Currently pregnant?: No Vaginal bleeding?: No Last menstrual period?: No  Gastrointestinal Nausea?: No Vomiting?: No Indigestion/heartburn?: No Diarrhea?: No Constipation?: No  Constitutional Fever: No Night sweats?: No Weight loss?: No Fatigue?: No  Skin Skin rash/lesions?: No Itching?: No  Eyes Blurred vision?: No Double vision?: No  Ears/Nose/Throat Sore throat?: No Sinus problems?: No  Hematologic/Lymphatic Swollen glands?: No Easy bruising?: No  Cardiovascular Leg swelling?: No Chest pain?: No  Respiratory Cough?: No Shortness of breath?: No  Endocrine Excessive thirst?: No  Musculoskeletal Back pain?: No Joint pain?: No  Neurological Headaches?: No Dizziness?: No     Physical Exam: BP 114/75   Pulse (!) 54   Ht 5' (1.524 m)   Wt 87.5 kg (192 lb 14.4 oz)   BMI 37.67 kg/m   Constitutional:  Alert and oriented, No acute distress.   Laboratory Data: Lab Results  Component Value Date   WBC 9.0 12/21/2015   HGB 14.1 12/21/2015   HCT 41.8 12/21/2015   MCV 87.6 12/21/2015   PLT 266.0 12/21/2015    Lab Results  Component Value Date   CREATININE 0.84 02/19/2016    No results found for: PSA  No results found for: TESTOSTERONE  Lab Results  Component Value Date   HGBA1C 7.2 (H) 02/19/2016    Urinalysis    Component Value Date/Time   COLORURINE YELLOW (A) 07/21/2015 1039   APPEARANCEUR Cloudy (A)  02/13/2016 1406   LABSPEC 1.012 07/21/2015 1039   PHURINE 7.0 07/21/2015 1039   GLUCOSEU Negative 02/13/2016 1406   HGBUR 2+ (A) 07/21/2015 1039   BILIRUBINUR Negative 02/13/2016 1406   KETONESUR negative 01/30/2016 1141   KETONESUR NEGATIVE 07/21/2015 1039   PROTEINUR Negative 02/13/2016 1406   PROTEINUR NEGATIVE 07/21/2015 1039   UROBILINOGEN 0.2 01/30/2016 1141   NITRITE Negative 02/13/2016 1406   NITRITE NEGATIVE 07/21/2015 1039   LEUKOCYTESUR 1+ (A) 02/13/2016 1406    Pertinent Imaging: none  Assessment & Plan:  The patient will be switched to daily Macrodantin. She would also like to start probiotics. I will see her in 3 months. She gets a rash with penicillin  There are  no diagnoses linked to this encounter.  No Follow-up on file.  Reece Packer, MD  [facility] [address], [place], [place] [address] [phone]

## 2016-03-29 ENCOUNTER — Encounter: Payer: Self-pay | Admitting: Internal Medicine

## 2016-03-29 NOTE — Assessment & Plan Note (Signed)
Follow vitamin D level.  

## 2016-03-29 NOTE — Assessment & Plan Note (Signed)
Diet and exercise.   

## 2016-03-29 NOTE — Assessment & Plan Note (Signed)
Low cholesterol diet and exercise.  Follow lipid panel.  She desires not to take medication.

## 2016-03-29 NOTE — Assessment & Plan Note (Signed)
Blood pressure under good control.  Continue same medication regimen.  Follow pressures.  Follow metabolic panel.   

## 2016-03-29 NOTE — Assessment & Plan Note (Signed)
On prophylaxis now.  Seeing urology.  Stable.

## 2016-03-29 NOTE — Assessment & Plan Note (Signed)
Sugars as outlined.  Improved.  Continue diet and exercise.  Follow met b and a1c.

## 2016-03-29 NOTE — Assessment & Plan Note (Signed)
On thyroid replacement.  Follow tsh.  

## 2016-06-19 ENCOUNTER — Other Ambulatory Visit (INDEPENDENT_AMBULATORY_CARE_PROVIDER_SITE_OTHER): Payer: Medicare Other

## 2016-06-19 DIAGNOSIS — E7800 Pure hypercholesterolemia, unspecified: Secondary | ICD-10-CM

## 2016-06-19 DIAGNOSIS — E119 Type 2 diabetes mellitus without complications: Secondary | ICD-10-CM

## 2016-06-19 LAB — HEPATIC FUNCTION PANEL
ALT: 15 U/L (ref 0–35)
AST: 14 U/L (ref 0–37)
Albumin: 4 g/dL (ref 3.5–5.2)
Alkaline Phosphatase: 79 U/L (ref 39–117)
Bilirubin, Direct: 0 mg/dL (ref 0.0–0.3)
Total Bilirubin: 0.6 mg/dL (ref 0.2–1.2)
Total Protein: 6.7 g/dL (ref 6.0–8.3)

## 2016-06-19 LAB — LIPID PANEL
CHOLESTEROL: 175 mg/dL (ref 0–200)
HDL: 43.6 mg/dL (ref >39.00)
LDL Cholesterol: 108 mg/dL — ABNORMAL HIGH (ref 0–99)
NonHDL: 131.72
TRIGLYCERIDES: 118 mg/dL (ref 0.0–149.0)
Total CHOL/HDL Ratio: 4
VLDL: 23.6 mg/dL (ref 0.0–40.0)

## 2016-06-19 LAB — BASIC METABOLIC PANEL
BUN: 10 mg/dL (ref 6–23)
CHLORIDE: 108 meq/L (ref 96–112)
CO2: 27 meq/L (ref 19–32)
CREATININE: 0.7 mg/dL (ref 0.40–1.20)
Calcium: 9.3 mg/dL (ref 8.4–10.5)
GFR: 86.78 mL/min (ref >60.00)
Glucose, Bld: 127 mg/dL — ABNORMAL HIGH (ref 70–99)
POTASSIUM: 3.8 meq/L (ref 3.5–5.1)
Sodium: 141 mEq/L (ref 135–145)

## 2016-06-19 LAB — MICROALBUMIN / CREATININE URINE RATIO
Creatinine,U: 110.9 mg/dL
Microalb Creat Ratio: 0.7 mg/g (ref 0.0–30.0)
Microalb, Ur: 0.8 mg/dL (ref 0.0–1.9)

## 2016-06-19 LAB — HEMOGLOBIN A1C: Hgb A1c MFr Bld: 7 % — ABNORMAL HIGH (ref 4.6–6.5)

## 2016-06-23 ENCOUNTER — Ambulatory Visit: Payer: Medicare Other

## 2016-06-23 ENCOUNTER — Ambulatory Visit (INDEPENDENT_AMBULATORY_CARE_PROVIDER_SITE_OTHER): Payer: Medicare Other | Admitting: Internal Medicine

## 2016-06-23 ENCOUNTER — Encounter: Payer: Self-pay | Admitting: Internal Medicine

## 2016-06-23 DIAGNOSIS — E669 Obesity, unspecified: Secondary | ICD-10-CM

## 2016-06-23 DIAGNOSIS — I10 Essential (primary) hypertension: Secondary | ICD-10-CM | POA: Diagnosis not present

## 2016-06-23 DIAGNOSIS — E7800 Pure hypercholesterolemia, unspecified: Secondary | ICD-10-CM | POA: Diagnosis not present

## 2016-06-23 DIAGNOSIS — E119 Type 2 diabetes mellitus without complications: Secondary | ICD-10-CM | POA: Diagnosis not present

## 2016-06-23 DIAGNOSIS — E039 Hypothyroidism, unspecified: Secondary | ICD-10-CM

## 2016-06-23 NOTE — Assessment & Plan Note (Signed)
Blood pressure under good control.  Continue same medication regimen.  Follow pressures.  Follow metabolic panel.   

## 2016-06-23 NOTE — Assessment & Plan Note (Signed)
Low carb diet and exercise.  Follow met b and a1c.  Recent a1c slightly improved - 7.0.  Discussed diet and exercise.  Follow met b and a1c.

## 2016-06-23 NOTE — Assessment & Plan Note (Signed)
On thyroid replacement.  Follow tsh.  

## 2016-06-23 NOTE — Assessment & Plan Note (Signed)
Diet and exercise.  Follow.  

## 2016-06-23 NOTE — Progress Notes (Signed)
Patient ID: Veronica Branch, female   DOB: 1940/09/09, 76 y.o.   MRN: [id number]   Subjective:    Patient ID: Veronica Branch, female    DOB: 1940/01/31, 76 y.o.   MRN: [id number]  HPI  Patient here for a scheduled follow up.  Discussed her lab results.  a1c decreased - 7.0.  Cholesterol improved.  Discussed diet and the need for exercise.  No chest pain.  No sob.  No acid reflux.  States off omeprazole and doing well off.  No abdominal pain.  Bowels moviing. States am sugars averaging 120-130 and pm sugars averaging <180.  She saw urology.  Started on prophylactic abx.  On macrobid daily.  Feels better.  Bowels and gas better.     Past Medical History:  Diagnosis Date  . Diverticulosis   . GERD (gastroesophageal reflux disease)   . Hiatal hernia   . History of migraine headaches   . Hypercholesterolemia   . Hyperglycemia   . Hypertension   . Hypothyroidism    s/p removal of right thyroid lobe and isthmus (1992)   Past Surgical History:  Procedure Laterality Date  . BREAST BIOPSY Right 2000   benign  . THYROID LOBECTOMY  1992   s/p removal of right thyroid lobe and isthmus   Family History  Problem Relation Age of Onset  . Coronary artery disease Mother        myocardial infarction and CHF  . Breast cancer Mother        30's  . Breast cancer Maternal Aunt        x 2  . Breast cancer Sister   . Breast cancer Cousin   . Colon cancer Neg Hx    Social History   Social History  . Marital status: Married    Spouse name: N/A  . Number of children: 2  . Years of education: N/A   Occupational History  . retired Pharmacist, hospital    Social History Main Topics  . Smoking status: Never Smoker  . Smokeless tobacco: Never Used  . Alcohol use No  . Drug use: No  . Sexual activity: Yes   Other Topics Concern  . None   Social History Narrative   Regularly exercises. Retired and married.     Outpatient Encounter Prescriptions as of 06/23/2016  Medication Sig  . aspirin 81 MG  tablet Take 81 mg by mouth daily.  . Blood Glucose Monitoring Suppl (ONE TOUCH ULTRA SYSTEM KIT) W/DEVICE KIT 1 kit by Does not apply route once.  . cefdinir (OMNICEF) 300 MG capsule Take 1 capsule (300 mg total) by mouth 2 (two) times daily.  . Lancets (ONETOUCH ULTRASOFT) lancets Use to test blood sugars 1-2 times daily   E11.9  . lisinopril (PRINIVIL,ZESTRIL) 10 MG tablet TAKE 1 TABLET BY MOUTH EVERY DAY  . nitrofurantoin (MACRODANTIN) 100 MG capsule Take 1 capsule (100 mg total) by mouth at bedtime.  Marland Kitchen omeprazole (PRILOSEC) 20 MG capsule TAKE 1 CAPSULE BY MOUTH EVERY DAY  . ONE TOUCH ULTRA TEST test strip TEST BLOOD SUGARS 1 TO 2 TIMES DAILY  . propranolol (INDERAL) 40 MG tablet TAKE 1 TABLET BY MOUTH TWICE A DAY  . SYNTHROID 100 MCG tablet TAKE 1 TABLET BY MOUTH EVERY DAY  . triamcinolone cream (KENALOG) 0.1 % Apply 1 application topically 2 (two) times daily.  Marland Kitchen trimethoprim (TRIMPEX) 100 MG tablet Take 1 tablet (100 mg total) by mouth daily.   No facility-administered encounter medications on file as  of 06/23/2016.     Review of Systems  Constitutional: Negative for appetite change and fatigue.  HENT: Negative for congestion and sinus pressure.   Respiratory: Negative for cough, chest tightness and shortness of breath.   Cardiovascular: Negative for chest pain, palpitations and leg swelling.  Gastrointestinal: Negative for abdominal pain, diarrhea, nausea and vomiting.  Genitourinary: Negative for difficulty urinating and dysuria.  Musculoskeletal: Negative for back pain and joint swelling.  Skin: Negative for color change and rash.  Neurological: Negative for dizziness, light-headedness, numbness and headaches.  Psychiatric/Behavioral: Negative for agitation and dysphoric mood.       Objective:     Blood pressure rechecked by me:  120/72  Physical Exam  Constitutional: She appears well-developed and well-nourished. No distress.  HENT:  Nose: Nose normal.  Mouth/Throat:  Oropharynx is clear and moist.  Neck: Neck supple. No thyromegaly present.  Cardiovascular: Normal rate and regular rhythm.   Pulmonary/Chest: Breath sounds normal. No respiratory distress. She has no wheezes.  Abdominal: Soft. Bowel sounds are normal. There is no tenderness.  Musculoskeletal: She exhibits no edema or tenderness.  Lymphadenopathy:    She has no cervical adenopathy.  Skin: No rash noted. No erythema.  Psychiatric: She has a normal mood and affect. Her behavior is normal.    BP 120/76 (BP Location: Left Arm, Patient Position: Sitting, Cuff Size: Normal)   Pulse (!) 59   Temp 98.6 F (37 C) (Oral)   Resp 12   Ht 5' (1.524 m)   Wt 191 lb 9.6 oz (86.9 kg)   SpO2 97%   BMI 37.42 kg/m  Wt Readings from Last 3 Encounters:  06/23/16 191 lb 9.6 oz (86.9 kg)  03/24/16 192 lb 14.4 oz (87.5 kg)  03/17/16 191 lb 3.2 oz (86.7 kg)     Lab Results  Component Value Date   WBC 9.0 12/21/2015   HGB 14.1 12/21/2015   HCT 41.8 12/21/2015   PLT 266.0 12/21/2015   GLUCOSE 127 (H) 06/19/2016   CHOL 175 06/19/2016   TRIG 118.0 06/19/2016   HDL 43.60 06/19/2016   LDLDIRECT 156.0 12/06/2012   LDLCALC 108 (H) 06/19/2016   ALT 15 06/19/2016   AST 14 06/19/2016   NA 141 06/19/2016   K 3.8 06/19/2016   CL 108 06/19/2016   CREATININE 0.70 06/19/2016   BUN 10 06/19/2016   CO2 27 06/19/2016   TSH 3.82 02/19/2016   HGBA1C 7.0 (H) 06/19/2016   MICROALBUR 0.8 06/19/2016    Mm Screening Breast Tomo Bilateral  Result Date: 11/01/2015 CLINICAL DATA:  Screening. EXAM: 2D DIGITAL SCREENING BILATERAL MAMMOGRAM WITH CAD AND ADJUNCT TOMO COMPARISON:  Previous exam(s). ACR Breast Density Category b: There are scattered areas of fibroglandular density. FINDINGS: There are no findings suspicious for malignancy. Images were processed with CAD. IMPRESSION: No mammographic evidence of malignancy. A result letter of this screening mammogram will be mailed directly to the patient. RECOMMENDATION:  Screening mammogram in one year. (Code:SM-B-01Y) BI-RADS CATEGORY  1: Negative. Electronically Signed   By: Franki Cabot M.D.   On: 11/01/2015 09:07       Assessment & Plan:   Problem List Items Addressed This Visit    Diabetes ([place])    Low carb diet and exercise.  Follow met b and a1c.  Recent a1c slightly improved - 7.0.  Discussed diet and exercise.  Follow met b and a1c.        Relevant Orders   Hemoglobin K2I   Basic metabolic panel  Hypercholesterolemia    Low cholesterol diet and exercise.  Recent check improved.  Follow lipid panel.        Relevant Orders   Hepatic function panel   Lipid panel   Hypertension    Blood pressure under good control.  Continue same medication regimen.  Follow pressures.  Follow metabolic panel.        Hypothyroidism    On thyroid replacement.  Follow tsh.        Obesity (BMI 30.0-34.9)    Diet and exercise.  Follow.            Einar Pheasant, MD

## 2016-06-23 NOTE — Assessment & Plan Note (Signed)
Low cholesterol diet and exercise.  Recent check improved.  Follow lipid panel.

## 2016-07-07 ENCOUNTER — Encounter: Payer: Self-pay | Admitting: Urology

## 2016-07-07 ENCOUNTER — Ambulatory Visit (INDEPENDENT_AMBULATORY_CARE_PROVIDER_SITE_OTHER): Payer: Medicare Other | Admitting: Urology

## 2016-07-07 VITALS — BP 124/71 | HR 66 | Ht 60.0 in | Wt 189.0 lb

## 2016-07-07 DIAGNOSIS — N3020 Other chronic cystitis without hematuria: Secondary | ICD-10-CM

## 2016-07-07 NOTE — Progress Notes (Signed)
07/07/2016 10:56 AM   Veronica Branch Oct 10, 1940 [id number]  Referring provider: Einar Pheasant, [address] [place], [place] [address]  Chief Complaint  Patient presents with  . Recurrent UTI    29monthfollow up    HPI: I saw the patient in March and she was placed on trimethoprim. She had urgency incontinence with typical triggers and a little bit of bedwetting. I was hoping to down regulate her incontinence on daily trimethoprim. Her urgency settled down and she may have had some diarrhea from the trimethoprim. She started probiotics and she gets a rash with penicillin. She was given Macrodantin last visit  Frequency stable  Bladder is still urge and but it is more of a nuisance   PMH: Past Medical History:  Diagnosis Date  . Diverticulosis   . GERD (gastroesophageal reflux disease)   . Hiatal hernia   . History of migraine headaches   . Hypercholesterolemia   . Hyperglycemia   . Hypertension   . Hypothyroidism    s/p removal of right thyroid lobe and isthmus (1992)    Surgical History: Past Surgical History:  Procedure Laterality Date  . BREAST BIOPSY Right 2000   benign  . THYROID LOBECTOMY  1992   s/p removal of right thyroid lobe and isthmus    Home Medications:  Allergies as of 07/07/2016      Reactions   Ciprofloxacin Hcl Other (See Comments)   tendonitis   Penicillins Hives   Urticaria   Sulfa Antibiotics Rash      Medication List       Accurate as of 07/07/16 10:56 AM. Always use your most recent med list.          aspirin 81 MG tablet Take 81 mg by mouth daily.   lisinopril 10 MG tablet Commonly known as:  PRINIVIL,ZESTRIL TAKE 1 TABLET BY MOUTH EVERY DAY   nitrofurantoin 100 MG capsule Commonly known as:  MACRODANTIN Take 1 capsule (100 mg total) by mouth at bedtime.   ONE TOUCH ULTRA SYSTEM KIT w/Device Kit 1 kit by Does not apply route once.   ONE TOUCH ULTRA TEST test strip Generic drug:   glucose blood TEST BLOOD SUGARS 1 TO 2 TIMES DAILY   onetouch ultrasoft lancets Use to test blood sugars 1-2 times daily   E11.9   propranolol 40 MG tablet Commonly known as:  INDERAL TAKE 1 TABLET BY MOUTH TWICE A DAY   SYNTHROID 100 MCG tablet Generic drug:  levothyroxine TAKE 1 TABLET BY MOUTH EVERY DAY   triamcinolone cream 0.1 % Commonly known as:  KENALOG Apply 1 application topically 2 (two) times daily.       Allergies:  Allergies  Allergen Reactions  . Ciprofloxacin Hcl Other (See Comments)    tendonitis  . Penicillins Hives    Urticaria   . Sulfa Antibiotics Rash    Family History: Family History  Problem Relation Age of Onset  . Coronary artery disease Mother        myocardial infarction and CHF  . Breast cancer Mother        574's . Breast cancer Maternal Aunt        x 2  . Breast cancer Sister   . Breast cancer Cousin   . Colon cancer Neg Hx     Social History:  reports that she has never smoked. She has never used smokeless tobacco. She reports that she does not drink alcohol or use drugs.  ROS: UROLOGY Frequent Urination?: Yes Hard to postpone urination?: Yes Burning/pain with urination?: No Get up at night to urinate?: Yes Leakage of urine?: Yes Urine stream starts and stops?: No Trouble starting stream?: No Do you have to strain to urinate?: No Blood in urine?: No Urinary tract infection?: No Sexually transmitted disease?: No Injury to kidneys or bladder?: No Painful intercourse?: No Weak stream?: No Currently pregnant?: No Vaginal bleeding?: No Last menstrual period?: n  Gastrointestinal Nausea?: No Vomiting?: No Indigestion/heartburn?: No Diarrhea?: No Constipation?: No  Constitutional Fever: No Night sweats?: No Weight loss?: No Fatigue?: No  Skin Skin rash/lesions?: No Itching?: No  Eyes Blurred vision?: No Double vision?: No  Ears/Nose/Throat Sore throat?: No Sinus problems?:  No  Hematologic/Lymphatic Swollen glands?: No Easy bruising?: No  Cardiovascular Leg swelling?: Yes Chest pain?: No  Respiratory Cough?: No Shortness of breath?: No  Endocrine Excessive thirst?: No  Musculoskeletal Back pain?: No Joint pain?: No  Neurological Headaches?: No Dizziness?: No  Psychologic Depression?: No Anxiety?: No  Physical Exam: BP 124/71   Pulse 66   Ht 5' (1.524 m)   Wt 85.7 kg (189 lb)   BMI 36.91 kg/m   Constitutional:  Alert and oriented, No acute distress.   Laboratory Data: Lab Results  Component Value Date   WBC 9.0 12/21/2015   HGB 14.1 12/21/2015   HCT 41.8 12/21/2015   MCV 87.6 12/21/2015   PLT 266.0 12/21/2015    Lab Results  Component Value Date   CREATININE 0.70 06/19/2016    No results found for: PSA  No results found for: TESTOSTERONE  Lab Results  Component Value Date   HGBA1C 7.0 (H) 06/19/2016    Urinalysis    Component Value Date/Time   COLORURINE YELLOW (A) 07/21/2015 1039   APPEARANCEUR Cloudy (A) 02/13/2016 1406   LABSPEC 1.012 07/21/2015 1039   PHURINE 7.0 07/21/2015 1039   GLUCOSEU Negative 02/13/2016 1406   HGBUR 2+ (A) 07/21/2015 1039   BILIRUBINUR Negative 02/13/2016 1406   KETONESUR negative 01/30/2016 1141   KETONESUR NEGATIVE 07/21/2015 1039   PROTEINUR Negative 02/13/2016 1406   PROTEINUR NEGATIVE 07/21/2015 1039   UROBILINOGEN 0.2 01/30/2016 1141   NITRITE Negative 02/13/2016 1406   NITRITE NEGATIVE 07/21/2015 1039   LEUKOCYTESUR 1+ (A) 02/13/2016 1406    Pertinent Imaging: none  Assessment & Plan:  Clinically infection free with no side effects on Macrodantin. She preferred not to try another pill for mild overactive bladder. I will reassess her in 4 months with the goals and then once a year.  There are no diagnoses linked to this encounter.  No Follow-up on file.  Reece Packer, MD  [facility] [address] [place], [place]  [address] [phone]

## 2016-07-08 ENCOUNTER — Other Ambulatory Visit: Payer: Self-pay | Admitting: Internal Medicine

## 2016-08-13 ENCOUNTER — Other Ambulatory Visit: Payer: Self-pay | Admitting: Internal Medicine

## 2016-08-15 ENCOUNTER — Other Ambulatory Visit: Payer: Self-pay | Admitting: Internal Medicine

## 2016-09-02 ENCOUNTER — Other Ambulatory Visit: Payer: Self-pay | Admitting: Internal Medicine

## 2016-10-29 ENCOUNTER — Other Ambulatory Visit (INDEPENDENT_AMBULATORY_CARE_PROVIDER_SITE_OTHER): Payer: Medicare Other

## 2016-10-29 ENCOUNTER — Telehealth: Payer: Self-pay

## 2016-10-29 ENCOUNTER — Other Ambulatory Visit: Payer: Self-pay | Admitting: Internal Medicine

## 2016-10-29 DIAGNOSIS — R8290 Unspecified abnormal findings in urine: Secondary | ICD-10-CM

## 2016-10-29 DIAGNOSIS — R319 Hematuria, unspecified: Secondary | ICD-10-CM

## 2016-10-29 DIAGNOSIS — E7800 Pure hypercholesterolemia, unspecified: Secondary | ICD-10-CM

## 2016-10-29 DIAGNOSIS — E119 Type 2 diabetes mellitus without complications: Secondary | ICD-10-CM

## 2016-10-29 LAB — POCT URINALYSIS DIP (MANUAL ENTRY)
BILIRUBIN UA: NEGATIVE
BILIRUBIN UA: NEGATIVE mg/dL
Glucose, UA: NEGATIVE mg/dL
Leukocytes, UA: NEGATIVE
Nitrite, UA: NEGATIVE
PH UA: 6 (ref 5.0–8.0)
Protein Ur, POC: NEGATIVE mg/dL
SPEC GRAV UA: 1.015 (ref 1.010–1.025)
Urobilinogen, UA: 0.2 E.U./dL

## 2016-10-29 LAB — HEPATIC FUNCTION PANEL
ALBUMIN: 4.2 g/dL (ref 3.5–5.2)
ALT: 16 U/L (ref 0–35)
AST: 13 U/L (ref 0–37)
Alkaline Phosphatase: 74 U/L (ref 39–117)
Bilirubin, Direct: 0.1 mg/dL (ref 0.0–0.3)
Total Bilirubin: 0.5 mg/dL (ref 0.2–1.2)
Total Protein: 7.1 g/dL (ref 6.0–8.3)

## 2016-10-29 LAB — BASIC METABOLIC PANEL
BUN: 12 mg/dL (ref 6–23)
CALCIUM: 9.4 mg/dL (ref 8.4–10.5)
CO2: 28 mEq/L (ref 19–32)
CREATININE: 0.69 mg/dL (ref 0.40–1.20)
Chloride: 105 mEq/L (ref 96–112)
GFR: 88.15 mL/min (ref >60.00)
GLUCOSE: 125 mg/dL — AB (ref 70–99)
Potassium: 4 mEq/L (ref 3.5–5.1)
SODIUM: 140 meq/L (ref 135–145)

## 2016-10-29 LAB — LIPID PANEL
Cholesterol: 184 mg/dL (ref 0–200)
HDL: 49.3 mg/dL (ref >39.00)
LDL CALC: 112 mg/dL — AB (ref 0–99)
NonHDL: 134.66
TRIGLYCERIDES: 111 mg/dL (ref 0.0–149.0)
Total CHOL/HDL Ratio: 4
VLDL: 22.2 mg/dL (ref 0.0–40.0)

## 2016-10-29 LAB — URINALYSIS, MICROSCOPIC ONLY: RBC / HPF: NONE SEEN (ref 0–?)

## 2016-10-29 LAB — HEMOGLOBIN A1C: HEMOGLOBIN A1C: 7.1 % — AB (ref 4.6–6.5)

## 2016-10-29 NOTE — Addendum Note (Signed)
Addended by: Warden Fillers on: 10/29/2016 11:44 AM   Modules accepted: Orders

## 2016-10-29 NOTE — Addendum Note (Signed)
Addended by: Warden Fillers on: 10/29/2016 12:08 PM   Modules accepted: Orders

## 2016-10-29 NOTE — Telephone Encounter (Signed)
Pt came in today for lab appt and had insect bite on rt arm. Area around site was swollen, warm to touch, and some redness. No open area or drainage. Advised antihistamine and to monitor site for excessive swelling or sign of infection. If she notices any of this, was advised to go to ED or urgent care. Patient has appt with PCP on 10/16.

## 2016-10-29 NOTE — Progress Notes (Signed)
Order placed for urine micro and urine culture.

## 2016-10-30 ENCOUNTER — Other Ambulatory Visit: Payer: Medicare Other

## 2016-10-30 LAB — URINE CULTURE
MICRO NUMBER:: 81129162
SPECIMEN QUALITY: ADEQUATE

## 2016-11-04 ENCOUNTER — Ambulatory Visit (INDEPENDENT_AMBULATORY_CARE_PROVIDER_SITE_OTHER): Payer: Medicare Other | Admitting: Internal Medicine

## 2016-11-04 DIAGNOSIS — E7800 Pure hypercholesterolemia, unspecified: Secondary | ICD-10-CM

## 2016-11-04 DIAGNOSIS — E559 Vitamin D deficiency, unspecified: Secondary | ICD-10-CM | POA: Diagnosis not present

## 2016-11-04 DIAGNOSIS — Z6837 Body mass index (BMI) 37.0-37.9, adult: Secondary | ICD-10-CM | POA: Diagnosis not present

## 2016-11-04 DIAGNOSIS — Z23 Encounter for immunization: Secondary | ICD-10-CM | POA: Diagnosis not present

## 2016-11-04 DIAGNOSIS — I10 Essential (primary) hypertension: Secondary | ICD-10-CM

## 2016-11-04 DIAGNOSIS — N390 Urinary tract infection, site not specified: Secondary | ICD-10-CM | POA: Diagnosis not present

## 2016-11-04 DIAGNOSIS — Z1231 Encounter for screening mammogram for malignant neoplasm of breast: Secondary | ICD-10-CM | POA: Diagnosis not present

## 2016-11-04 DIAGNOSIS — E039 Hypothyroidism, unspecified: Secondary | ICD-10-CM

## 2016-11-04 DIAGNOSIS — E119 Type 2 diabetes mellitus without complications: Secondary | ICD-10-CM

## 2016-11-04 DIAGNOSIS — Z1239 Encounter for other screening for malignant neoplasm of breast: Secondary | ICD-10-CM

## 2016-11-04 MED ORDER — PRAVASTATIN SODIUM 40 MG PO TABS: 40.0000 mg | ORAL_TABLET | Freq: Every day | ORAL | 3 refills | Status: DC

## 2016-11-04 NOTE — Progress Notes (Signed)
Patient ID: Veronica Branch, female   DOB: 01/24/1940, 76 y.o.   MRN: [id number]   Subjective:    Patient ID: Veronica Branch, female    DOB: May 27, 1940, 76 y.o.   MRN: [id number]  HPI  Patient here for a scheduled follow up.  She sees urology for recurring infections and overactive bladder.  On macrodantin.  Doing well.  No urinary symptoms now.  She was recently stung by a wasp.  Had some local swelling on her fore arm.  Resolving.  Discussed recent labs.  Discussed the need to get her sugars down.  Discussed medication.  She again declines.  Also discussed calculated cholesterol risk.  Discussed the need to start cholesterol medication.  Discussed importance of exercise.  She does not exercise regularly.  No chest pain.  No sob.  No acid reflux.  No abdominal pain.  Bowels moving.     Past Medical History:  Diagnosis Date  . Diverticulosis   . GERD (gastroesophageal reflux disease)   . Hiatal hernia   . History of migraine headaches   . Hypercholesterolemia   . Hyperglycemia   . Hypertension   . Hypothyroidism    s/p removal of right thyroid lobe and isthmus (1992)   Past Surgical History:  Procedure Laterality Date  . BREAST BIOPSY Right 2000   benign  . THYROID LOBECTOMY  1992   s/p removal of right thyroid lobe and isthmus   Family History  Problem Relation Age of Onset  . Coronary artery disease Mother        myocardial infarction and CHF  . Breast cancer Mother        64's  . Breast cancer Maternal Aunt        x 2  . Breast cancer Sister   . Breast cancer Cousin   . Colon cancer Neg Hx    Social History   Social History  . Marital status: Married    Spouse name: N/A  . Number of children: 2  . Years of education: N/A   Occupational History  . retired Pharmacist, hospital    Social History Main Topics  . Smoking status: Never Smoker  . Smokeless tobacco: Never Used  . Alcohol use No  . Drug use: No  . Sexual activity: Yes   Other Topics Concern  . None    Social History Narrative   Regularly exercises. Retired and married.     Outpatient Encounter Prescriptions as of 11/04/2016  Medication Sig  . aspirin 81 MG tablet Take 81 mg by mouth daily.  . Blood Glucose Monitoring Suppl (ONE TOUCH ULTRA SYSTEM KIT) W/DEVICE KIT 1 kit by Does not apply route once.  . Lancets (ONETOUCH ULTRASOFT) lancets Use to test blood sugars 1-2 times daily   E11.9  . lisinopril (PRINIVIL,ZESTRIL) 10 MG tablet TAKE 1 TABLET BY MOUTH EVERY DAY  . nitrofurantoin (MACRODANTIN) 100 MG capsule Take 1 capsule (100 mg total) by mouth at bedtime.  . ONE TOUCH ULTRA TEST test strip TEST BLOOD SUGARS 1 TO 2 TIMES DAILY  . propranolol (INDERAL) 40 MG tablet TAKE 1 TABLET BY MOUTH TWICE A DAY  . SYNTHROID 100 MCG tablet TAKE 1 TABLET BY MOUTH EVERY DAY  . pravastatin (PRAVACHOL) 40 MG tablet Take 1 tablet (40 mg total) by mouth daily.  . [DISCONTINUED] SYNTHROID 100 MCG tablet TAKE 1 TABLET BY MOUTH EVERY DAY  . [DISCONTINUED] triamcinolone cream (KENALOG) 0.1 % Apply 1 application topically 2 (two) times daily.  No facility-administered encounter medications on file as of 11/04/2016.     Review of Systems  Constitutional: Negative for appetite change and unexpected weight change.  HENT: Negative for congestion and sinus pressure.   Respiratory: Negative for cough, chest tightness and shortness of breath.   Cardiovascular: Negative for chest pain, palpitations and leg swelling.  Gastrointestinal: Negative for abdominal pain, diarrhea, nausea and vomiting.  Genitourinary: Negative for difficulty urinating and dysuria.  Musculoskeletal: Negative for joint swelling and myalgias.  Skin: Negative for rash.       Resolving - local swelling - s/p sting.    Neurological: Negative for dizziness, light-headedness and headaches.  Psychiatric/Behavioral: Negative for agitation and dysphoric mood.       Objective:    Physical Exam  Constitutional: She appears  well-developed and well-nourished.  HENT:  Nose: Nose normal.  Mouth/Throat: Oropharynx is clear and moist.  Neck: Neck supple. No thyromegaly present.  Cardiovascular: Normal rate and regular rhythm.   Pulmonary/Chest: Breath sounds normal. No respiratory distress. She has no wheezes.  Abdominal: Soft. Bowel sounds are normal. There is no tenderness.  Musculoskeletal: She exhibits no edema or tenderness.  Lymphadenopathy:    She has no cervical adenopathy.  Skin: No rash noted. No erythema.  Psychiatric: She has a normal mood and affect. Her behavior is normal.    BP 128/78 (BP Location: Left Arm, Patient Position: Sitting, Cuff Size: Large)   Pulse 63   Temp 98.2 F (36.8 C) (Oral)   Wt 191 lb 6.4 oz (86.8 kg)   SpO2 95%   BMI 37.38 kg/m  Wt Readings from Last 3 Encounters:  11/04/16 191 lb 6.4 oz (86.8 kg)  07/07/16 189 lb (85.7 kg)  06/23/16 191 lb 9.6 oz (86.9 kg)     Lab Results  Component Value Date   WBC 9.0 12/21/2015   HGB 14.1 12/21/2015   HCT 41.8 12/21/2015   PLT 266.0 12/21/2015   GLUCOSE 125 (H) 10/29/2016   CHOL 184 10/29/2016   TRIG 111.0 10/29/2016   HDL 49.30 10/29/2016   LDLDIRECT 156.0 12/06/2012   LDLCALC 112 (H) 10/29/2016   ALT 16 10/29/2016   AST 13 10/29/2016   NA 140 10/29/2016   K 4.0 10/29/2016   CL 105 10/29/2016   CREATININE 0.69 10/29/2016   BUN 12 10/29/2016   CO2 28 10/29/2016   TSH 3.82 02/19/2016   HGBA1C 7.1 (H) 10/29/2016   MICROALBUR 0.8 06/19/2016    Mm Screening Breast Tomo Bilateral  Result Date: 11/01/2015 CLINICAL DATA:  Screening. EXAM: 2D DIGITAL SCREENING BILATERAL MAMMOGRAM WITH CAD AND ADJUNCT TOMO COMPARISON:  Previous exam(s). ACR Breast Density Category b: There are scattered areas of fibroglandular density. FINDINGS: There are no findings suspicious for malignancy. Images were processed with CAD. IMPRESSION: No mammographic evidence of malignancy. A result letter of this screening mammogram will be mailed  directly to the patient. RECOMMENDATION: Screening mammogram in one year. (Code:SM-B-01Y) BI-RADS CATEGORY  1: Negative. Electronically Signed   By: Franki Cabot M.D.   On: 11/01/2015 09:07       Assessment & Plan:   Problem List Items Addressed This Visit    BMI 37.0-37.9, adult    Discussed diet and exercise.  Follow.        Diabetes ([place])    Discussed low carb diet and exercise.  Discussed treatment and starting medication.  She declines.  Wants to work on diet and exercise.  Follow met b and a1c.  Relevant Medications   pravastatin (PRAVACHOL) 40 MG tablet   Hypercholesterolemia    Discussed cholesterol results and calculated risk.  He agreed to restart pravastatin.  Restart pravastatin 1m q day.  Follow lipid panel and liver function tests.  Low cholesterol diet and exercise.        Relevant Medications   pravastatin (PRAVACHOL) 40 MG tablet   Other Relevant Orders   Hepatic function panel   Hypertension    Blood pressure under good control.  Continue same medication regimen.  Follow pressures.  Follow metabolic panel.        Relevant Medications   pravastatin (PRAVACHOL) 40 MG tablet   Hypothyroidism    On thyroid replacement.  Follow tsh.       Recurrent UTI    On prophylaxis now.  Stable.  Continue f/u with urology.       Vitamin D deficiency    Follow vitamin D level.         Other Visit Diagnoses    Breast cancer screening       Relevant Orders   MM DIGITAL SCREENING BILATERAL   Need for pneumococcal vaccination           SEinar Pheasant MD

## 2016-11-07 ENCOUNTER — Encounter: Payer: Self-pay | Admitting: Internal Medicine

## 2016-11-07 NOTE — Assessment & Plan Note (Signed)
Follow vitamin D level.  

## 2016-11-07 NOTE — Assessment & Plan Note (Signed)
Blood pressure under good control.  Continue same medication regimen.  Follow pressures.  Follow metabolic panel.   

## 2016-11-07 NOTE — Assessment & Plan Note (Signed)
Discussed low carb diet and exercise.  Discussed treatment and starting medication.  She declines.  Wants to work on diet and exercise.  Follow met b and a1c.

## 2016-11-07 NOTE — Assessment & Plan Note (Signed)
Discussed diet and exercise.  Follow.  

## 2016-11-07 NOTE — Assessment & Plan Note (Signed)
On thyroid replacement.  Follow tsh.  

## 2016-11-07 NOTE — Assessment & Plan Note (Signed)
Discussed cholesterol results and calculated risk.  He agreed to restart pravastatin.  Restart pravastatin 40mg  q day.  Follow lipid panel and liver function tests.  Low cholesterol diet and exercise.

## 2016-11-07 NOTE — Assessment & Plan Note (Signed)
On prophylaxis now.  Stable.  Continue f/u with urology.

## 2016-11-09 ENCOUNTER — Other Ambulatory Visit: Payer: Self-pay | Admitting: Internal Medicine

## 2016-11-12 ENCOUNTER — Ambulatory Visit: Payer: Medicare Other

## 2016-11-20 ENCOUNTER — Ambulatory Visit
Admission: RE | Admit: 2016-11-20 | Discharge: 2016-11-20 | Disposition: A | Discharge status: Home / Self Care | Payer: Medicare Other | Source: Ambulatory Visit | Attending: Internal Medicine | Admitting: Internal Medicine

## 2016-11-20 DIAGNOSIS — Z1231 Encounter for screening mammogram for malignant neoplasm of breast: Secondary | ICD-10-CM | POA: Insufficient documentation

## 2016-11-20 DIAGNOSIS — Z1239 Encounter for other screening for malignant neoplasm of breast: Secondary | ICD-10-CM

## 2016-11-24 ENCOUNTER — Encounter: Payer: Self-pay | Admitting: Urology

## 2016-11-24 ENCOUNTER — Ambulatory Visit: Payer: Medicare Other | Admitting: Urology

## 2016-11-24 VITALS — BP 136/79 | HR 73 | Ht 60.0 in | Wt 190.0 lb

## 2016-11-24 DIAGNOSIS — N390 Urinary tract infection, site not specified: Secondary | ICD-10-CM | POA: Diagnosis not present

## 2016-11-24 LAB — URINALYSIS, COMPLETE
BILIRUBIN UA: NEGATIVE
Glucose, UA: NEGATIVE
Ketones, UA: NEGATIVE
LEUKOCYTES UA: NEGATIVE
Nitrite, UA: NEGATIVE
PH UA: 6 (ref 5.0–7.5)
PROTEIN UA: NEGATIVE
SPEC GRAV UA: 1.01 (ref 1.005–1.030)
Urobilinogen, Ur: 0.2 mg/dL (ref 0.2–1.0)

## 2016-11-24 LAB — MICROSCOPIC EXAMINATION: Bacteria, UA: NONE SEEN

## 2016-11-24 NOTE — Progress Notes (Signed)
11/24/2016 2:28 PM   Veronica Branch January 28, 1940 [id number]  Referring provider: Einar Pheasant, [address] [place], [place] [id number]  Chief Complaint  Patient presents with  . Recurrent UTI    HPI: I saw the patient in March and she was placed on trimethoprim. She had urgency incontinence with typical triggers and a little bit of bedwetting. I was hoping to down regulate her incontinence on daily trimethoprim. Her urgency settled down and she may have had some diarrhea from the trimethoprim. She started probiotics and she gets a rash with penicillin. She was given Macrodantin last visit   Clinically infection free with no side effects on Macrodantin. She preferred not to try another pill for mild overactive bladder. I will reassess her in 4 months with the goals and then once a year.  Today:  The patient clinically is doing well on the once a day Macrodantin but sometimes her urine is strong smelling not associated with any pain.  Recently she was told she had microscopic hematuria.  She has never had a kidney stone and she has never smoked  She had no microscopic hematuria today and I sent the urine for culture  She had a normal CT scan of the abdomen and pelvis in July 2017.  I did not perform cystoscopy when I first saw her in March 2018  Modifying factors: There are no other modifying factors  Associated signs and symptoms: There are no other associated signs and symptoms Aggravating and relieving factors: There are no other aggravating or relieving factors Severity: Moderate Duration: Persistent  PMH: Past Medical History:  Diagnosis Date  . Diverticulosis   . GERD (gastroesophageal reflux disease)   . Hiatal hernia   . History of migraine headaches   . Hypercholesterolemia   . Hyperglycemia   . Hypertension   . Hypothyroidism    s/p removal of right thyroid lobe and isthmus (1992)    Surgical History: Past Surgical History:    Procedure Laterality Date  . BREAST BIOPSY Right 2000   benign  . THYROID LOBECTOMY  1992   s/p removal of right thyroid lobe and isthmus    Home Medications:  Allergies as of 11/24/2016      Reactions   Ciprofloxacin Hcl Other (See Comments)   tendonitis   Penicillins Hives   Urticaria   Sulfa Antibiotics Rash      Medication List        Accurate as of 11/24/16  2:28 PM. Always use your most recent med list.          aspirin 81 MG tablet Take 81 mg by mouth daily.   lisinopril 10 MG tablet Commonly known as:  PRINIVIL,ZESTRIL TAKE 1 TABLET BY MOUTH EVERY DAY   nitrofurantoin 100 MG capsule Commonly known as:  MACRODANTIN Take 1 capsule (100 mg total) by mouth at bedtime.   ONE TOUCH ULTRA SYSTEM KIT w/Device Kit 1 kit by Does not apply route once.   ONE TOUCH ULTRA TEST test strip Generic drug:  glucose blood TEST BLOOD SUGARS 1 TO 2 TIMES DAILY   onetouch ultrasoft lancets Use to test blood sugars 1-2 times daily   E11.9   pravastatin 40 MG tablet Commonly known as:  PRAVACHOL Take 1 tablet (40 mg total) by mouth daily.   propranolol 40 MG tablet Commonly known as:  INDERAL TAKE 1 TABLET BY MOUTH TWICE A DAY   SYNTHROID 100 MCG tablet Generic drug:  levothyroxine TAKE 1 TABLET  BY MOUTH EVERY DAY       Allergies:  Allergies  Allergen Reactions  . Ciprofloxacin Hcl Other (See Comments)    tendonitis  . Penicillins Hives    Urticaria   . Sulfa Antibiotics Rash    Family History: Family History  Problem Relation Age of Onset  . Coronary artery disease Mother        myocardial infarction and CHF  . Breast cancer Mother        68's  . Breast cancer Maternal Aunt        x 2  . Breast cancer Sister   . Breast cancer Cousin   . Colon cancer Neg Hx     Social History:  reports that  has never smoked. she has never used smokeless tobacco. She reports that she does not drink alcohol or use drugs.  ROS:                                         Physical Exam: BP 136/79   Pulse 73   Ht 5' (1.524 m)   Wt 190 lb (86.2 kg)   BMI 37.11 kg/m   Constitutional:  Alert and oriented, No acute distress.  Laboratory Data: Lab Results  Component Value Date   WBC 9.0 12/21/2015   HGB 14.1 12/21/2015   HCT 41.8 12/21/2015   MCV 87.6 12/21/2015   PLT 266.0 12/21/2015    Lab Results  Component Value Date   CREATININE 0.69 10/29/2016      Lab Results  Component Value Date   HGBA1C 7.1 (H) 10/29/2016    Urinalysis    Component Value Date/Time   COLORURINE YELLOW (A) 07/21/2015 1039   APPEARANCEUR Cloudy (A) 02/13/2016 1406   LABSPEC 1.012 07/21/2015 1039   PHURINE 7.0 07/21/2015 1039   GLUCOSEU Negative 02/13/2016 1406   HGBUR 2+ (A) 07/21/2015 1039   BILIRUBINUR negative 10/29/2016 1143   BILIRUBINUR Negative 02/13/2016 1406   KETONESUR negative 10/29/2016 1143   KETONESUR NEGATIVE 07/21/2015 1039   PROTEINUR negative 10/29/2016 1143   PROTEINUR Negative 02/13/2016 1406   PROTEINUR NEGATIVE 07/21/2015 1039   UROBILINOGEN 0.2 10/29/2016 1143   NITRITE Negative 10/29/2016 1143   NITRITE Negative 02/13/2016 1406   NITRITE NEGATIVE 07/21/2015 1039   LEUKOCYTESUR Negative 10/29/2016 1143   LEUKOCYTESUR 1+ (A) 02/13/2016 1406    Pertinent Imaging:   Assessment & Plan: Workup for microscopic hematuria discussed.  Differential diagnosis was discussed.  We discussed that twice.  She elected not to have the workup.  I thought based upon our counseling her decision I respect and was very reasonable.  I will see her in 6 months.  She will stay on prophylaxis.  I will call the culture is positive  1. Recurrent UTI 2.  Microscopic hematuria  - Urinalysis, Complete   No Follow-up on file.  Reece Packer, MD  [facility] [address] [place], [place] [address] [phone]

## 2016-11-27 LAB — CULTURE, URINE COMPREHENSIVE

## 2016-12-17 ENCOUNTER — Other Ambulatory Visit (INDEPENDENT_AMBULATORY_CARE_PROVIDER_SITE_OTHER): Payer: Medicare Other

## 2016-12-17 DIAGNOSIS — Z23 Encounter for immunization: Secondary | ICD-10-CM | POA: Diagnosis not present

## 2016-12-17 DIAGNOSIS — E7800 Pure hypercholesterolemia, unspecified: Secondary | ICD-10-CM

## 2016-12-17 LAB — HEPATIC FUNCTION PANEL
ALBUMIN: 3.9 g/dL (ref 3.5–5.2)
ALK PHOS: 75 U/L (ref 39–117)
ALT: 15 U/L (ref 0–35)
AST: 13 U/L (ref 0–37)
BILIRUBIN DIRECT: 0 mg/dL (ref 0.0–0.3)
TOTAL PROTEIN: 7 g/dL (ref 6.0–8.3)
Total Bilirubin: 0.5 mg/dL (ref 0.2–1.2)

## 2016-12-18 ENCOUNTER — Other Ambulatory Visit: Payer: Self-pay | Admitting: Internal Medicine

## 2016-12-18 DIAGNOSIS — E039 Hypothyroidism, unspecified: Secondary | ICD-10-CM

## 2016-12-18 DIAGNOSIS — E7800 Pure hypercholesterolemia, unspecified: Secondary | ICD-10-CM

## 2016-12-18 DIAGNOSIS — E119 Type 2 diabetes mellitus without complications: Secondary | ICD-10-CM

## 2016-12-18 DIAGNOSIS — E559 Vitamin D deficiency, unspecified: Secondary | ICD-10-CM

## 2016-12-18 DIAGNOSIS — I10 Essential (primary) hypertension: Secondary | ICD-10-CM

## 2016-12-18 NOTE — Progress Notes (Signed)
Orders placed for f/u labs.  

## 2017-01-27 ENCOUNTER — Other Ambulatory Visit: Payer: Self-pay | Admitting: Internal Medicine

## 2017-01-30 ENCOUNTER — Other Ambulatory Visit: Payer: Self-pay

## 2017-01-30 DIAGNOSIS — N390 Urinary tract infection, site not specified: Secondary | ICD-10-CM

## 2017-01-30 MED ORDER — NITROFURANTOIN MACROCRYSTAL 100 MG PO CAPS: 100.0000 mg | ORAL_CAPSULE | Freq: Every day | ORAL | 11 refills | Status: DC

## 2017-02-10 ENCOUNTER — Ambulatory Visit: Payer: Medicare Other

## 2017-03-12 ENCOUNTER — Emergency Department
Admission: EM | Admit: 2017-03-12 | Discharge: 2017-03-12 | Disposition: A | Discharge status: Home / Self Care | Payer: Medicare Other | Attending: Student in an Organized Health Care Education/Training Program | Admitting: Student in an Organized Health Care Education/Training Program

## 2017-03-12 ENCOUNTER — Encounter: Payer: Self-pay | Admitting: Emergency Medicine

## 2017-03-12 ENCOUNTER — Emergency Department: Payer: Medicare Other

## 2017-03-12 DIAGNOSIS — R197 Diarrhea, unspecified: Secondary | ICD-10-CM | POA: Insufficient documentation

## 2017-03-12 DIAGNOSIS — R1032 Left lower quadrant pain: Secondary | ICD-10-CM | POA: Insufficient documentation

## 2017-03-12 DIAGNOSIS — Z7982 Long term (current) use of aspirin: Secondary | ICD-10-CM | POA: Insufficient documentation

## 2017-03-12 DIAGNOSIS — E119 Type 2 diabetes mellitus without complications: Secondary | ICD-10-CM | POA: Insufficient documentation

## 2017-03-12 DIAGNOSIS — R638 Other symptoms and signs concerning food and fluid intake: Secondary | ICD-10-CM | POA: Diagnosis not present

## 2017-03-12 DIAGNOSIS — R112 Nausea with vomiting, unspecified: Secondary | ICD-10-CM | POA: Insufficient documentation

## 2017-03-12 DIAGNOSIS — Z79899 Other long term (current) drug therapy: Secondary | ICD-10-CM | POA: Diagnosis not present

## 2017-03-12 DIAGNOSIS — I10 Essential (primary) hypertension: Secondary | ICD-10-CM | POA: Diagnosis not present

## 2017-03-12 DIAGNOSIS — E039 Hypothyroidism, unspecified: Secondary | ICD-10-CM | POA: Insufficient documentation

## 2017-03-12 LAB — URINALYSIS, COMPLETE (UACMP) WITH MICROSCOPIC
BACTERIA UA: NONE SEEN
Bilirubin Urine: NEGATIVE
GLUCOSE, UA: NEGATIVE mg/dL
KETONES UR: 20 mg/dL — AB
Leukocytes, UA: NEGATIVE
Nitrite: NEGATIVE
PROTEIN: NEGATIVE mg/dL
Specific Gravity, Urine: 1.04 — ABNORMAL HIGH (ref 1.005–1.030)
pH: 7 (ref 5.0–8.0)

## 2017-03-12 LAB — COMPREHENSIVE METABOLIC PANEL
ALK PHOS: 67 U/L (ref 38–126)
ALT: 24 U/L (ref 14–54)
ANION GAP: 10 (ref 5–15)
AST: 19 U/L (ref 15–41)
Albumin: 4 g/dL (ref 3.5–5.0)
BUN: 12 mg/dL (ref 6–20)
CALCIUM: 8.9 mg/dL (ref 8.9–10.3)
CHLORIDE: 105 mmol/L (ref 101–111)
CO2: 22 mmol/L (ref 22–32)
Creatinine, Ser: 0.73 mg/dL (ref 0.44–1.00)
GFR calc Af Amer: >60 mL/min (ref >60)
GFR calc non Af Amer: >60 mL/min (ref >60)
GLUCOSE: 123 mg/dL — AB (ref 65–99)
Potassium: 4 mmol/L (ref 3.5–5.1)
SODIUM: 137 mmol/L (ref 135–145)
Total Bilirubin: 1 mg/dL (ref 0.3–1.2)
Total Protein: 7.6 g/dL (ref 6.5–8.1)

## 2017-03-12 LAB — CBC
HCT: 44.1 % (ref 35.0–47.0)
HEMOGLOBIN: 14.7 g/dL (ref 12.0–16.0)
MCH: 29.9 pg (ref 26.0–34.0)
MCHC: 33.4 g/dL (ref 32.0–36.0)
MCV: 89.4 fL (ref 80.0–100.0)
Platelets: 258 10*3/uL (ref 150–440)
RBC: 4.93 MIL/uL (ref 3.80–5.20)
RDW: 13.1 % (ref 11.5–14.5)
WBC: 10.3 10*3/uL (ref 3.6–11.0)

## 2017-03-12 LAB — LIPASE, BLOOD: Lipase: 25 U/L (ref 11–51)

## 2017-03-12 MED ORDER — POLYETHYLENE GLYCOL 3350 17 G PO PACK: 17.0000 g | PACK | Freq: Every day | ORAL | 0 refills | Status: DC

## 2017-03-12 MED ORDER — TRAMADOL HCL 50 MG PO TABS: 50.0000 mg | ORAL_TABLET | Freq: Four times a day (QID) | ORAL | 0 refills | Status: DC | PRN

## 2017-03-12 MED ORDER — IOPAMIDOL (ISOVUE-300) INJECTION 61%
100.0000 mL | Freq: Once | INTRAVENOUS | Status: AC | PRN
  Administered 2017-03-12: 100 mL via INTRAVENOUS
  Filled 2017-03-12: qty 100

## 2017-03-12 MED ORDER — FENTANYL CITRATE (PF) 100 MCG/2ML IJ SOLN: 50.0000 ug | INTRAMUSCULAR | Status: DC | PRN

## 2017-03-12 MED ORDER — PROMETHAZINE HCL 25 MG/ML IJ SOLN: 12.5000 mg | Freq: Four times a day (QID) | INTRAMUSCULAR | Status: DC | PRN

## 2017-03-12 MED ORDER — SODIUM CHLORIDE 0.9 % IV BOLUS (SEPSIS)
500.0000 mL | Freq: Once | INTRAVENOUS | Status: AC
  Administered 2017-03-12: 500 mL via INTRAVENOUS

## 2017-03-12 MED ORDER — ONDANSETRON HCL 4 MG PO TABS: 4.0000 mg | ORAL_TABLET | Freq: Every day | ORAL | 0 refills | Status: DC | PRN

## 2017-03-12 NOTE — ED Notes (Signed)
Pt reports pain to LLQ of her abdomen since Tuesday. Pt reports area is really sore. Pt reports thinks it is coming from her hernia. Pt reports she thinks it has popped out. Pt reports hernia has came out before and she was able to push it back in. Pt holding the left side of her abdomen.

## 2017-03-12 NOTE — Discharge Instructions (Signed)

## 2017-03-12 NOTE — ED Triage Notes (Addendum)
Pt arrived with complaints of abdominal pain, emesis, and diarrhea since Tuesday. Pt states "I am actually better today." Pt states her husband wanted her to come in for evaluation. Pt denies chest pain or current nausea. Pt states "I just feel like I have a lot of gas."

## 2017-03-12 NOTE — ED Provider Notes (Signed)
[facility] Emergency Department Provider Note    First MD Initiated Contact with Patient 03/12/17 1417     (approximate)  I have reviewed the triage vital signs and the nursing notes.   HISTORY  Chief Complaint Abdominal Pain    HPI Veronica Branch is a 77 y.o. female with a history of diverticulitis presents for evaluation of left lower quadrant abdominal pain.  States that the symptoms started Tuesday with some irregular bowel movements as well as abdominal pain nausea vomiting and diarrhea.  Denies any chest pain.  States the pain comes and goes.  Has had some decreased oral intake.  No blood in her stools.  States that it feels similar but worse than previous episodes of diverticulitis.  No dysuria.  No recent antibiotics.  Pain is nonradiating.  Past Medical History:  Diagnosis Date  . Diverticulosis   . GERD (gastroesophageal reflux disease)   . Hiatal hernia   . History of migraine headaches   . Hypercholesterolemia   . Hyperglycemia   . Hypertension   . Hypothyroidism    s/p removal of right thyroid lobe and isthmus (1992)   Family History  Problem Relation Age of Onset  . Coronary artery disease Mother        myocardial infarction and CHF  . Breast cancer Mother        52's  . Breast cancer Maternal Aunt        x 2  . Breast cancer Sister   . Breast cancer Cousin   . Colon cancer Neg Hx    Past Surgical History:  Procedure Laterality Date  . BREAST BIOPSY Right 2000   benign  . THYROID LOBECTOMY  1992   s/p removal of right thyroid lobe and isthmus   Patient Active Problem List   Diagnosis Date Noted  . Recurrent UTI 01/30/2016  . Abdominal bloating 12/23/2015  . Health care maintenance 06/12/2014  . Vitamin D deficiency 02/09/2014  . BMI 37.0-37.9, adult 07/22/2013  . Osteopenia 11/25/2011  . History of migraine headaches 11/25/2011  . Hypercholesterolemia 11/25/2011  . Hypertension 11/25/2011  . Diabetes ([place])  11/25/2011  . Hypothyroidism 11/25/2011      Prior to Admission medications   Medication Sig Start Date End Date Taking? Authorizing Provider  aspirin 81 MG tablet Take 81 mg by mouth daily.    [provider]  Blood Glucose Monitoring Suppl (ONE TOUCH ULTRA SYSTEM KIT) W/DEVICE KIT 1 kit by Does not apply route once. 03/01/14   Einar Pheasant, MD  Lancets [facility] ULTRASOFT) lancets Use to test blood sugars 1-2 times daily   E11.9 03/01/14   Einar Pheasant, MD  lisinopril (PRINIVIL,ZESTRIL) 10 MG tablet TAKE 1 TABLET BY MOUTH EVERY DAY 11/10/16   Einar Pheasant, MD  nitrofurantoin (MACRODANTIN) 100 MG capsule Take 1 capsule (100 mg total) by mouth at bedtime. 01/30/17   Bjorn Loser, MD  ondansetron (ZOFRAN) 4 MG tablet Take 1 tablet (4 mg total) by mouth daily as needed for nausea or vomiting. 03/12/17 03/12/18  Merlyn Lot, MD  ONE TOUCH ULTRA TEST test strip TEST BLOOD SUGARS 1 TO 2 TIMES DAILY 03/10/16   Einar Pheasant, MD  polyethylene glycol (MIRALAX / GLYCOLAX) packet Take 17 g by mouth daily. Mix one tablespoon with 8oz of your favorite juice or water every day until you are having soft formed stools. Then start taking once daily if you didn't have a stool the day before. 03/12/17   Merlyn Lot,  MD  pravastatin (PRAVACHOL) 40 MG tablet Take 1 tablet (40 mg total) by mouth daily. 11/04/16   Einar Pheasant, MD  propranolol (INDERAL) 40 MG tablet TAKE 1 TABLET BY MOUTH TWICE A DAY 07/08/16   Einar Pheasant, MD  SYNTHROID 100 MCG tablet TAKE 1 TABLET BY MOUTH EVERY DAY 08/13/16   Einar Pheasant, MD  traMADol (ULTRAM) 50 MG tablet Take 1 tablet (50 mg total) by mouth every 6 (six) hours as needed. 03/12/17 03/12/18  Merlyn Lot, MD    Allergies Ciprofloxacin hcl; Penicillins; and Sulfa antibiotics    Social History Social History   Tobacco Use  . Smoking status: Never Smoker  . Smokeless tobacco: Never Used  Substance Use Topics  . Alcohol use: No     Alcohol/week: 0.0 oz  . Drug use: No    Review of Systems Patient denies headaches, rhinorrhea, blurry vision, numbness, shortness of breath, chest pain, edema, cough, abdominal pain, nausea, vomiting, diarrhea, dysuria, fevers, rashes or hallucinations unless otherwise stated above in HPI. ____________________________________________   PHYSICAL EXAM:  VITAL SIGNS: Vitals:   03/12/17 1242 03/12/17 1548  BP: 136/85 (!) 152/86  Pulse:  76  Resp: 18 16  Temp: 98.1 F (36.7 C)   SpO2: 97% 100%    Constitutional: Alert and oriented.  in no acute distress. Eyes: Conjunctivae are normal.  Head: Atraumatic. Nose: No congestion/rhinnorhea. Mouth/Throat: Mucous membranes are moist.   Neck: No stridor. Painless ROM.  Cardiovascular: Normal rate, regular rhythm. Grossly normal heart sounds.  Good peripheral circulation. Respiratory: Normal respiratory effort.  No retractions. Lungs CTAB. Gastrointestinal: Soft with localized ttp of llq. No distention. No abdominal bruits. No CVA tenderness. Genitourinary:  Musculoskeletal: No lower extremity tenderness nor edema.  No joint effusions. Neurologic:  Normal speech and language. No gross focal neurologic deficits are appreciated. No facial droop Skin:  Skin is warm, dry and intact. No rash noted. Psychiatric: Mood and affect are normal. Speech and behavior are normal.  ____________________________________________   LABS (all labs ordered are listed, but only abnormal results are displayed)  Results for orders placed or performed during the hospital encounter of 03/12/17 (from the past 24 hour(s))  Lipase, blood     Status: None   Collection Time: 03/12/17 12:42 PM  Result Value Ref Range   Lipase 25 11 - 51 U/L  Comprehensive metabolic panel     Status: Abnormal   Collection Time: 03/12/17 12:42 PM  Result Value Ref Range   Sodium 137 135 - 145 mmol/L   Potassium 4.0 3.5 - 5.1 mmol/L   Chloride 105 101 - 111 mmol/L   CO2 22 22 -  32 mmol/L   Glucose, Bld 123 (H) 65 - 99 mg/dL   BUN 12 6 - 20 mg/dL   Creatinine, Ser 0.73 0.44 - 1.00 mg/dL   Calcium 8.9 8.9 - 10.3 mg/dL   Total Protein 7.6 6.5 - 8.1 g/dL   Albumin 4.0 3.5 - 5.0 g/dL   AST 19 15 - 41 U/L   ALT 24 14 - 54 U/L   Alkaline Phosphatase 67 38 - 126 U/L   Total Bilirubin 1.0 0.3 - 1.2 mg/dL   GFR calc non Af Amer >60 >60 mL/min   GFR calc Af Amer >60 >60 mL/min   Anion gap 10 5 - 15  CBC     Status: None   Collection Time: 03/12/17 12:42 PM  Result Value Ref Range   WBC 10.3 3.6 - 11.0 K/uL   RBC  4.93 3.80 - 5.20 MIL/uL   Hemoglobin 14.7 12.0 - 16.0 g/dL   HCT 44.1 35.0 - 47.0 %   MCV 89.4 80.0 - 100.0 fL   MCH 29.9 26.0 - 34.0 pg   MCHC 33.4 32.0 - 36.0 g/dL   RDW 13.1 11.5 - 14.5 %   Platelets 258 150 - 440 K/uL  Urinalysis, Complete w Microscopic     Status: Abnormal   Collection Time: 03/12/17  4:49 PM  Result Value Ref Range   Color, Urine STRAW (A) YELLOW   APPearance CLEAR (A) CLEAR   Specific Gravity, Urine 1.040 (H) 1.005 - 1.030   pH 7.0 5.0 - 8.0   Glucose, UA NEGATIVE NEGATIVE mg/dL   Hgb urine dipstick MODERATE (A) NEGATIVE   Bilirubin Urine NEGATIVE NEGATIVE   Ketones, ur 20 (A) NEGATIVE mg/dL   Protein, ur NEGATIVE NEGATIVE mg/dL   Nitrite NEGATIVE NEGATIVE   Leukocytes, UA NEGATIVE NEGATIVE   RBC / HPF 0-5 0 - 5 RBC/hpf   WBC, UA 0-5 0 - 5 WBC/hpf   Bacteria, UA NONE SEEN NONE SEEN   Squamous Epithelial / LPF 0-5 (A) NONE SEEN   Mucus PRESENT    ____________________________________________  EKG My review and personal interpretation at Time: 15:19   Indication: abd pain  Rate: 70  Rhythm: sinus Axis: normal Other: normal intervals, no stemi ____________________________________________  RADIOLOGY  I personally reviewed all radiographic images ordered to evaluate for the above acute complaints and reviewed radiology reports and findings.  These findings were personally discussed with the patient.  Please see medical  record for radiology report.  ____________________________________________   PROCEDURES  Procedure(s) performed:  Procedures    Critical Care performed: no ____________________________________________   INITIAL IMPRESSION / ASSESSMENT AND PLAN / ED COURSE  Pertinent labs & imaging results that were available during my care of the patient were reviewed by me and considered in my medical decision making (see chart for details).  DDX: Colitis, diverticulitis, stone, perforation, musculoskeletal strain, constipation  Hiyab T Lindh is a 76 y.o. who presents to the ED with symptoms as described above.  She is afebrile and hemodynamically stable.  CT imaging and blood work ordered for the above differential.  CT imaging shows no evidence of acute intra-abdominal process.  Does seem to have moderate stool burden with a lot of gaseous distention which I do suspect is causing most of her symptoms.  No evidence of UTI.  Not clinically consistent with stone.  Most consistent with bloating.  No evidence of tumor or obstructive process.  Patient stable and appropriate for outpatient management.      ____________________________________________   FINAL CLINICAL IMPRESSION(S) / ED DIAGNOSES  Final diagnoses:  Left lower quadrant pain      NEW MEDICATIONS STARTED DURING THIS VISIT:  New Prescriptions   ONDANSETRON (ZOFRAN) 4 MG TABLET    Take 1 tablet (4 mg total) by mouth daily as needed for nausea or vomiting.   POLYETHYLENE GLYCOL (MIRALAX / GLYCOLAX) PACKET    Take 17 g by mouth daily. Mix one tablespoon with 8oz of your favorite juice or water every day until you are having soft formed stools. Then start taking once daily if you didn't have a stool the day before.   TRAMADOL (ULTRAM) 50 MG TABLET    Take 1 tablet (50 mg total) by mouth every 6 (six) hours as needed.     Note:  This document was prepared using Dragon voice recognition software and may   include unintentional  dictation errors.    Robinson, Patrick, MD 03/12/17 1757  

## 2017-03-16 ENCOUNTER — Other Ambulatory Visit (INDEPENDENT_AMBULATORY_CARE_PROVIDER_SITE_OTHER): Payer: Medicare Other

## 2017-03-16 DIAGNOSIS — E7800 Pure hypercholesterolemia, unspecified: Secondary | ICD-10-CM

## 2017-03-16 DIAGNOSIS — E119 Type 2 diabetes mellitus without complications: Secondary | ICD-10-CM | POA: Diagnosis not present

## 2017-03-16 DIAGNOSIS — I10 Essential (primary) hypertension: Secondary | ICD-10-CM

## 2017-03-16 DIAGNOSIS — E559 Vitamin D deficiency, unspecified: Secondary | ICD-10-CM

## 2017-03-16 DIAGNOSIS — E039 Hypothyroidism, unspecified: Secondary | ICD-10-CM

## 2017-03-16 LAB — LIPID PANEL
CHOLESTEROL: 156 mg/dL (ref 0–200)
HDL: 36.7 mg/dL — ABNORMAL LOW (ref >39.00)
LDL Cholesterol: 100 mg/dL — ABNORMAL HIGH (ref 0–99)
NonHDL: 119.79
TRIGLYCERIDES: 99 mg/dL (ref 0.0–149.0)
Total CHOL/HDL Ratio: 4
VLDL: 19.8 mg/dL (ref 0.0–40.0)

## 2017-03-16 LAB — CBC WITH DIFFERENTIAL/PLATELET
BASOS PCT: 1.5 % (ref 0.0–3.0)
Basophils Absolute: 0.1 10*3/uL (ref 0.0–0.1)
EOS PCT: 2.1 % (ref 0.0–5.0)
Eosinophils Absolute: 0.2 10*3/uL (ref 0.0–0.7)
HCT: 42.2 % (ref 36.0–46.0)
HEMOGLOBIN: 14.5 g/dL (ref 12.0–15.0)
LYMPHS ABS: 1.6 10*3/uL (ref 0.7–4.0)
Lymphocytes Relative: 21.5 % (ref 12.0–46.0)
MCHC: 34.5 g/dL (ref 30.0–36.0)
MCV: 90.2 fl (ref 78.0–100.0)
MONO ABS: 0.5 10*3/uL (ref 0.1–1.0)
Monocytes Relative: 6.5 % (ref 3.0–12.0)
NEUTROS PCT: 68.4 % (ref 43.0–77.0)
Neutro Abs: 5.2 10*3/uL (ref 1.4–7.7)
PLATELETS: 254 10*3/uL (ref 150.0–400.0)
RBC: 4.67 Mil/uL (ref 3.87–5.11)
RDW: 13.2 % (ref 11.5–15.5)
WBC: 7.6 10*3/uL (ref 4.0–10.5)

## 2017-03-16 LAB — BASIC METABOLIC PANEL
BUN: 9 mg/dL (ref 6–23)
CHLORIDE: 107 meq/L (ref 96–112)
CO2: 26 mEq/L (ref 19–32)
CREATININE: 0.67 mg/dL (ref 0.40–1.20)
Calcium: 9.2 mg/dL (ref 8.4–10.5)
GFR: 91.1 mL/min (ref >60.00)
GLUCOSE: 123 mg/dL — AB (ref 70–99)
POTASSIUM: 4 meq/L (ref 3.5–5.1)
Sodium: 141 mEq/L (ref 135–145)

## 2017-03-16 LAB — HEPATIC FUNCTION PANEL
ALBUMIN: 3.8 g/dL (ref 3.5–5.2)
ALT: 26 U/L (ref 0–35)
AST: 18 U/L (ref 0–37)
Alkaline Phosphatase: 68 U/L (ref 39–117)
Bilirubin, Direct: 0.2 mg/dL (ref 0.0–0.3)
Total Bilirubin: 0.4 mg/dL (ref 0.2–1.2)
Total Protein: 6.7 g/dL (ref 6.0–8.3)

## 2017-03-16 LAB — TSH: TSH: 4.32 u[IU]/mL (ref 0.35–4.50)

## 2017-03-16 LAB — HEMOGLOBIN A1C: HEMOGLOBIN A1C: 7.2 % — AB (ref 4.6–6.5)

## 2017-03-16 LAB — VITAMIN D 25 HYDROXY (VIT D DEFICIENCY, FRACTURES): VITD: 21.22 ng/mL — ABNORMAL LOW (ref 30.00–100.00)

## 2017-03-18 ENCOUNTER — Ambulatory Visit (INDEPENDENT_AMBULATORY_CARE_PROVIDER_SITE_OTHER): Payer: Medicare Other | Admitting: Internal Medicine

## 2017-03-18 ENCOUNTER — Encounter: Payer: Self-pay | Admitting: Internal Medicine

## 2017-03-18 VITALS — BP 130/78 | HR 59 | Temp 97.5°F | Resp 18 | Ht 60.0 in | Wt 193.0 lb

## 2017-03-18 DIAGNOSIS — R197 Diarrhea, unspecified: Secondary | ICD-10-CM | POA: Diagnosis not present

## 2017-03-18 DIAGNOSIS — E119 Type 2 diabetes mellitus without complications: Secondary | ICD-10-CM | POA: Diagnosis not present

## 2017-03-18 DIAGNOSIS — E559 Vitamin D deficiency, unspecified: Secondary | ICD-10-CM

## 2017-03-18 DIAGNOSIS — Z6837 Body mass index (BMI) 37.0-37.9, adult: Secondary | ICD-10-CM | POA: Diagnosis not present

## 2017-03-18 DIAGNOSIS — Z0000 Encounter for general adult medical examination without abnormal findings: Secondary | ICD-10-CM | POA: Diagnosis not present

## 2017-03-18 DIAGNOSIS — R195 Other fecal abnormalities: Secondary | ICD-10-CM

## 2017-03-18 DIAGNOSIS — E039 Hypothyroidism, unspecified: Secondary | ICD-10-CM

## 2017-03-18 DIAGNOSIS — E7800 Pure hypercholesterolemia, unspecified: Secondary | ICD-10-CM | POA: Diagnosis not present

## 2017-03-18 DIAGNOSIS — I10 Essential (primary) hypertension: Secondary | ICD-10-CM | POA: Diagnosis not present

## 2017-03-18 MED ORDER — OMEPRAZOLE 20 MG PO CPDR: 20.0000 mg | DELAYED_RELEASE_CAPSULE | Freq: Every day | ORAL | 3 refills | Status: DC

## 2017-03-18 NOTE — Patient Instructions (Addendum)
Examples of probiotics:  Culturelle, align or florastor.    Also start vitamin D3 1000 units per day.

## 2017-03-18 NOTE — Progress Notes (Signed)
Patient ID: Veronica Branch, female   DOB: 05-18-1940, 77 y.o.   MRN: [id number]   Subjective:    Patient ID: Veronica Branch, female    DOB: 10/23/1940, 77 y.o.   MRN: [id number]  HPI  Patient here for her physical exam.  Saw urology 11/24/16.  Declines further w/up for microscopic hematuria.  Recommended continuing prophylaxis and f/u in 6 months.  Was seen in ER 03/12/17 for abdominal pain/LLQ pain.  CT scan revealed no acute intraabdominal process.  Was given zofran and miralax.  Better now.  Still with some loose stool.  Off abx.  Stopped last week.  Pain resolved.  Some acid reflux.  Off omeprazole.  Discussed restarting and taking on a regular basis.  States sugars in the am are <140.  Discussed taking vitamin D.  Discussed diet and exercise.     Past Medical History:  Diagnosis Date  . Diverticulosis   . GERD (gastroesophageal reflux disease)   . Hiatal hernia   . History of migraine headaches   . Hypercholesterolemia   . Hyperglycemia   . Hypertension   . Hypothyroidism    s/p removal of right thyroid lobe and isthmus (1992)   Past Surgical History:  Procedure Laterality Date  . BREAST BIOPSY Right 2000   benign  . THYROID LOBECTOMY  1992   s/p removal of right thyroid lobe and isthmus   Family History  Problem Relation Age of Onset  . Coronary artery disease Mother        myocardial infarction and CHF  . Breast cancer Mother        57's  . Breast cancer Maternal Aunt        x 2  . Breast cancer Sister   . Breast cancer Cousin   . Colon cancer Neg Hx    Social History   Socioeconomic History  . Marital status: Married    Spouse name: None  . Number of children: 2  . Years of education: None  . Highest education level: None  Social Needs  . Financial resource strain: None  . Food insecurity - worry: None  . Food insecurity - inability: None  . Transportation needs - medical: None  . Transportation needs - non-medical: None  Occupational History  .  Occupation: retired Pharmacist, hospital  Tobacco Use  . Smoking status: Never Smoker  . Smokeless tobacco: Never Used  Substance and Sexual Activity  . Alcohol use: No    Alcohol/week: 0.0 oz  . Drug use: No  . Sexual activity: Yes  Other Topics Concern  . None  Social History Narrative   Regularly exercises. Retired and married.     Outpatient Encounter Medications as of 03/18/2017  Medication Sig  . aspirin 81 MG tablet Take 81 mg by mouth daily.  . Blood Glucose Monitoring Suppl (ONE TOUCH ULTRA SYSTEM KIT) W/DEVICE KIT 1 kit by Does not apply route once.  . Lancets (ONETOUCH ULTRASOFT) lancets Use to test blood sugars 1-2 times daily   E11.9  . lisinopril (PRINIVIL,ZESTRIL) 10 MG tablet TAKE 1 TABLET BY MOUTH EVERY DAY  . nitrofurantoin (MACRODANTIN) 100 MG capsule Take 1 capsule (100 mg total) by mouth at bedtime.  Marland Kitchen omeprazole (PRILOSEC) 20 MG capsule Take 1 capsule (20 mg total) by mouth daily.  . ondansetron (ZOFRAN) 4 MG tablet Take 1 tablet (4 mg total) by mouth daily as needed for nausea or vomiting.  . ONE TOUCH ULTRA TEST test strip TEST BLOOD SUGARS 1 TO  2 TIMES DAILY  . polyethylene glycol (MIRALAX / GLYCOLAX) packet Take 17 g by mouth daily. Mix one tablespoon with 8oz of your favorite juice or water every day until you are having soft formed stools. Then start taking once daily if you didn't have a stool the day before.  . pravastatin (PRAVACHOL) 40 MG tablet Take 1 tablet (40 mg total) by mouth daily.  . propranolol (INDERAL) 40 MG tablet TAKE 1 TABLET BY MOUTH TWICE A DAY  . SYNTHROID 100 MCG tablet TAKE 1 TABLET BY MOUTH EVERY DAY  . traMADol (ULTRAM) 50 MG tablet Take 1 tablet (50 mg total) by mouth every 6 (six) hours as needed.   No facility-administered encounter medications on file as of 03/18/2017.     Review of Systems  Constitutional: Negative for appetite change and unexpected weight change.  HENT: Negative for congestion and sinus pressure.   Eyes: Negative for  pain and visual disturbance.  Respiratory: Negative for cough, chest tightness and shortness of breath.   Cardiovascular: Negative for chest pain, palpitations and leg swelling.  Gastrointestinal: Negative for diarrhea, nausea and vomiting.       Abdominal pain resolved.  Still with some loose stool.  Some acid reflux.   Genitourinary: Negative for difficulty urinating and dysuria.  Musculoskeletal: Negative for back pain and joint swelling.  Skin: Negative for color change and rash.  Neurological: Negative for dizziness, light-headedness and headaches.  Hematological: Negative for adenopathy. Does not bruise/bleed easily.  Psychiatric/Behavioral: Negative for agitation and dysphoric mood.       Objective:     Blood pressure rechecked by me:  130/78  Physical Exam  Constitutional: She is oriented to person, place, and time. She appears well-developed and well-nourished. No distress.  HENT:  Nose: Nose normal.  Mouth/Throat: Oropharynx is clear and moist.  Eyes: Right eye exhibits no discharge. Left eye exhibits no discharge. No scleral icterus.  Neck: Neck supple. No thyromegaly present.  Cardiovascular: Normal rate and regular rhythm.  Pulmonary/Chest: Breath sounds normal. No accessory muscle usage. No tachypnea. No respiratory distress. She has no decreased breath sounds. She has no wheezes. She has no rhonchi. Right breast exhibits no inverted nipple, no mass, no nipple discharge and no tenderness (no axillary adenopathy). Left breast exhibits no inverted nipple, no mass, no nipple discharge and no tenderness (no axilarry adenopathy).  Abdominal: Soft. Bowel sounds are normal. There is no tenderness.  Musculoskeletal: She exhibits no edema or tenderness.  Lymphadenopathy:    She has no cervical adenopathy.  Neurological: She is alert and oriented to person, place, and time.  Skin: Skin is warm. No rash noted. No erythema.  Psychiatric: She has a normal mood and affect. Her  behavior is normal.    BP 130/78   Pulse (!) 59   Temp (!) 97.5 F (36.4 C) (Oral)   Resp 18   Ht 5' (1.524 m)   Wt 193 lb (87.5 kg)   SpO2 97%   BMI 37.69 kg/m  Wt Readings from Last 3 Encounters:  03/18/17 193 lb (87.5 kg)  03/12/17 180 lb (81.6 kg)  11/24/16 190 lb (86.2 kg)     Lab Results  Component Value Date   WBC 7.6 03/16/2017   HGB 14.5 03/16/2017   HCT 42.2 03/16/2017   PLT 254.0 03/16/2017   GLUCOSE 123 (H) 03/16/2017   CHOL 156 03/16/2017   TRIG 99.0 03/16/2017   HDL 36.70 (L) 03/16/2017   LDLDIRECT 156.0 12/06/2012   LDLCALC 100 (  H) 03/16/2017   ALT 26 03/16/2017   AST 18 03/16/2017   NA 141 03/16/2017   K 4.0 03/16/2017   CL 107 03/16/2017   CREATININE 0.67 03/16/2017   BUN 9 03/16/2017   CO2 26 03/16/2017   TSH 4.32 03/16/2017   HGBA1C 7.2 (H) 03/16/2017   MICROALBUR 0.8 06/19/2016    Ct Abdomen Pelvis W Contrast  Result Date: 03/12/2017 CLINICAL DATA:  Acute left lower quadrant abdominal pain. EXAM: CT ABDOMEN AND PELVIS WITH CONTRAST TECHNIQUE: Multidetector CT imaging of the abdomen and pelvis was performed using the standard protocol following bolus administration of intravenous contrast. CONTRAST:  140m ISOVUE-300 IOPAMIDOL (ISOVUE-300) INJECTION 61% COMPARISON:  CT scan of July 21, 2015. FINDINGS: Lower chest: No acute abnormality. Hepatobiliary: No focal liver abnormality is seen. No gallstones, gallbladder wall thickening, or biliary dilatation. Pancreas: Unremarkable. No pancreatic ductal dilatation or surrounding inflammatory changes. Spleen: Normal in size without focal abnormality. Adrenals/Urinary Tract: Adrenal glands are unremarkable. Kidneys are normal, without renal calculi, focal lesion, or hydronephrosis. Bladder is unremarkable. Stomach/Bowel: Stomach is within normal limits. Appendix appears normal. No evidence of bowel wall thickening, distention, or inflammatory changes. Sigmoid diverticulosis is noted without inflammation.  Vascular/Lymphatic: Aortic atherosclerosis. No enlarged abdominal or pelvic lymph nodes. Reproductive: Uterus and bilateral adnexa are unremarkable. Other: There is continued presence of mild fat containing periumbilical hernia which demonstrates significantly less inflammation compared to prior exam. No abnormal fluid collection is noted. Musculoskeletal: No acute or significant osseous findings. IMPRESSION: Sigmoid diverticulosis is noted without inflammation. Continued presence of small fat containing periumbilical hernia, which demonstrates significantly less inflammation compared to prior exam. Aortic Atherosclerosis (ICD10-I70.0). Electronically Signed   By: JMarijo Conception M.D.   On: 03/12/2017 16:01       Assessment & Plan:   Problem List Items Addressed This Visit    BMI 37.0-37.9, adult    Discussed diet and exercise.  Follow.       Diabetes [place]    Discussed diet and exercise.  Follow met b and a1c.  Discussed medication.  She declines.  Follow met b and a1c.        Health care maintenance    Physical today 03/18/17.  Mammogram 11/20/16 - birads I.  colonosocpy 09/04/12.        Hypercholesterolemia    Low cholesterol diet and exercise.  On pravastatin.  Follow lipid panel and liver function tests.        Hypertension    Blood pressure on recheck wnl.  Same medication regimen.  Follow pressures.  Follow metabolic panel.        Hypothyroidism    On thyroid replacement.  Follow tsh.       Loose stools    Persistent loose stools.  Check c.diff.  Is better.  Follow.       Vitamin D deficiency    Take vitamin D3 1000 units per day.         Other Visit Diagnoses    Diarrhea, unspecified type    -  Primary   Relevant Orders   C. difficile GDH and Toxin A/B       SEinar Pheasant MD

## 2017-03-18 NOTE — Assessment & Plan Note (Signed)
Physical today 03/18/17.  Mammogram 11/20/16 - birads I.  colonosocpy 09/04/12.

## 2017-03-21 DIAGNOSIS — R195 Other fecal abnormalities: Secondary | ICD-10-CM | POA: Insufficient documentation

## 2017-03-21 NOTE — Assessment & Plan Note (Signed)
On thyroid replacement.  Follow tsh.  

## 2017-03-21 NOTE — Assessment & Plan Note (Signed)
Persistent loose stools.  Check c.diff.  Is better.  Follow.

## 2017-03-21 NOTE — Assessment & Plan Note (Signed)
Discussed diet and exercise.  Follow.  

## 2017-03-21 NOTE — Assessment & Plan Note (Signed)
Low cholesterol diet and exercise.  On pravastatin.  Follow lipid panel and liver function tests.   

## 2017-03-21 NOTE — Assessment & Plan Note (Signed)
Take vitamin D3 1000 units per day.   

## 2017-03-21 NOTE — Assessment & Plan Note (Signed)
Blood pressure on recheck wnl.  Same medication regimen.  Follow pressures.  Follow metabolic panel.   

## 2017-03-21 NOTE — Assessment & Plan Note (Signed)
Discussed diet and exercise.  Follow met b and a1c.  Discussed medication.  She declines.  Follow met b and a1c.

## 2017-04-29 ENCOUNTER — Other Ambulatory Visit: Payer: Medicare Other

## 2017-04-29 DIAGNOSIS — R197 Diarrhea, unspecified: Secondary | ICD-10-CM

## 2017-04-30 LAB — C. DIFFICILE GDH AND TOXIN A/B
GDH ANTIGEN: NOT DETECTED
MICRO NUMBER:: 90444509
SPECIMEN QUALITY: ADEQUATE
TOXIN A AND B: NOT DETECTED

## 2017-05-14 ENCOUNTER — Ambulatory Visit: Payer: Medicare Other | Admitting: Internal Medicine

## 2017-05-14 DIAGNOSIS — R140 Abdominal distension (gaseous): Secondary | ICD-10-CM | POA: Diagnosis not present

## 2017-05-14 DIAGNOSIS — E559 Vitamin D deficiency, unspecified: Secondary | ICD-10-CM

## 2017-05-14 DIAGNOSIS — E039 Hypothyroidism, unspecified: Secondary | ICD-10-CM

## 2017-05-14 DIAGNOSIS — I700 Atherosclerosis of aorta: Secondary | ICD-10-CM | POA: Diagnosis not present

## 2017-05-14 DIAGNOSIS — Z6837 Body mass index (BMI) 37.0-37.9, adult: Secondary | ICD-10-CM | POA: Diagnosis not present

## 2017-05-14 DIAGNOSIS — E7800 Pure hypercholesterolemia, unspecified: Secondary | ICD-10-CM

## 2017-05-14 DIAGNOSIS — E119 Type 2 diabetes mellitus without complications: Secondary | ICD-10-CM

## 2017-05-14 DIAGNOSIS — N390 Urinary tract infection, site not specified: Secondary | ICD-10-CM

## 2017-05-14 LAB — HM DIABETES FOOT EXAM

## 2017-05-14 NOTE — Progress Notes (Signed)
Patient ID: GWENNA FUSTON, female   DOB: 1940/04/22, 77 y.o.   MRN: [id number]   Subjective:    Patient ID: MARYLON VERNO, female    DOB: [birth year], 77 y.o.   MRN: [id number]  HPI  Patient here for a scheduled follow up.  She was seen 03/12/17 for LLQ abdominal pain with nausea and vomiting.  CT revealed no acute intra abdominal process.  Did have moderate stool burden with gas distention.  Since discharge from the ER, she reports no problem with pain.  States her bowels have been moving almost daily.  Desires no further intervention or w/up.  States she has been trying to do better with her diet.  Blood sugars in am 130s and two hours after eating 150-160.  Discussed exercise.  She does not exercise regularly.  Saw urology 11/2016.  Stable.  Recommended f/u in 6 months.  No chest pain. No sob.  Overall she feels she is doing better.     Past Medical History:  Diagnosis Date  . Diverticulosis   . GERD (gastroesophageal reflux disease)   . Hiatal hernia   . History of migraine headaches   . Hypercholesterolemia   . Hyperglycemia   . Hypertension   . Hypothyroidism    s/p removal of right thyroid lobe and isthmus (1992)   Past Surgical History:  Procedure Laterality Date  . BREAST BIOPSY Right 2000   benign  . THYROID LOBECTOMY  1992   s/p removal of right thyroid lobe and isthmus   Family History  Problem Relation Age of Onset  . Coronary artery disease Mother        myocardial infarction and CHF  . Breast cancer Mother        48's  . Breast cancer Maternal Aunt        x 2  . Breast cancer Sister   . Breast cancer Cousin   . Colon cancer Neg Hx    Social History   Socioeconomic History  . Marital status: Married    Spouse name: Not on file  . Number of children: 2  . Years of education: Not on file  . Highest education level: Not on file  Occupational History  . Occupation: retired Tour manager  . Financial resource strain: Not on file  . Food  insecurity:    Worry: Not on file    Inability: Not on file  . Transportation needs:    Medical: Not on file    Non-medical: Not on file  Tobacco Use  . Smoking status: Never Smoker  . Smokeless tobacco: Never Used  Substance and Sexual Activity  . Alcohol use: No    Alcohol/week: 0.0 oz  . Drug use: No  . Sexual activity: Yes  Lifestyle  . Physical activity:    Days per week: Not on file    Minutes per session: Not on file  . Stress: Not on file  Relationships  . Social connections:    Talks on phone: Not on file    Gets together: Not on file    Attends religious service: Not on file    Active member of club or organization: Not on file    Attends meetings of clubs or organizations: Not on file    Relationship status: Not on file  Other Topics Concern  . Not on file  Social History Narrative   Regularly exercises. Retired and married.     Outpatient Encounter Medications as of 05/14/2017  Medication  Sig  . aspirin 81 MG tablet Take 81 mg by mouth daily.  . Blood Glucose Monitoring Suppl (ONE TOUCH ULTRA SYSTEM KIT) W/DEVICE KIT 1 kit by Does not apply route once.  . Lancets (ONETOUCH ULTRASOFT) lancets Use to test blood sugars 1-2 times daily   E11.9  . lisinopril (PRINIVIL,ZESTRIL) 10 MG tablet TAKE 1 TABLET BY MOUTH EVERY DAY  . omeprazole (PRILOSEC) 20 MG capsule Take 1 capsule (20 mg total) by mouth daily.  . ONE TOUCH ULTRA TEST test strip TEST BLOOD SUGARS 1 TO 2 TIMES DAILY  . polyethylene glycol (MIRALAX / GLYCOLAX) packet Take 17 g by mouth daily. Mix one tablespoon with 8oz of your favorite juice or water every day until you are having soft formed stools. Then start taking once daily if you didn't have a stool the day before.  . propranolol (INDERAL) 40 MG tablet TAKE 1 TABLET BY MOUTH TWICE A DAY  . SYNTHROID 100 MCG tablet TAKE 1 TABLET BY MOUTH EVERY DAY  . [DISCONTINUED] nitrofurantoin (MACRODANTIN) 100 MG capsule Take 1 capsule (100 mg total) by mouth at  bedtime.  . [DISCONTINUED] ondansetron (ZOFRAN) 4 MG tablet Take 1 tablet (4 mg total) by mouth daily as needed for nausea or vomiting.  . [DISCONTINUED] pravastatin (PRAVACHOL) 40 MG tablet Take 1 tablet (40 mg total) by mouth daily.  . [DISCONTINUED] traMADol (ULTRAM) 50 MG tablet Take 1 tablet (50 mg total) by mouth every 6 (six) hours as needed.   No facility-administered encounter medications on file as of 05/14/2017.     Review of Systems  Constitutional: Negative for appetite change and unexpected weight change.  HENT: Negative for congestion and sinus pressure.   Respiratory: Negative for cough, chest tightness and shortness of breath.   Cardiovascular: Negative for chest pain, palpitations and leg swelling.  Gastrointestinal: Negative for abdominal pain, diarrhea, nausea and vomiting.  Genitourinary: Negative for difficulty urinating and dysuria.  Musculoskeletal: Negative for joint swelling and myalgias.  Skin: Negative for color change and rash.  Neurological: Negative for dizziness, light-headedness and headaches.  Psychiatric/Behavioral: Negative for agitation and dysphoric mood.       Objective:     Blood pressure rechecked by me:  128/74  Physical Exam  Constitutional: She appears well-developed and well-nourished. No distress.  HENT:  Nose: Nose normal.  Mouth/Throat: Oropharynx is clear and moist.  Neck: Neck supple. No thyromegaly present.  Cardiovascular: Normal rate and regular rhythm.  Pulmonary/Chest: Breath sounds normal. No respiratory distress. She has no wheezes.  Abdominal: Soft. Bowel sounds are normal. There is no tenderness.  Musculoskeletal: She exhibits no edema or tenderness.  Feet:  No lesions.  Intact to pin prick and light touch.    Lymphadenopathy:    She has no cervical adenopathy.  Skin: No rash noted. No erythema.  Psychiatric: She has a normal mood and affect. Her behavior is normal.    BP 120/74 (BP Location: Left Arm, Patient  Position: Sitting, Cuff Size: Normal)   Pulse 63   Temp 98.1 F (36.7 C) (Oral)   Resp 18   Wt 193 lb 3.2 oz (87.6 kg)   SpO2 94%   BMI 37.73 kg/m  Wt Readings from Last 3 Encounters:  05/14/17 193 lb 3.2 oz (87.6 kg)  03/18/17 193 lb (87.5 kg)  03/12/17 180 lb (81.6 kg)     Lab Results  Component Value Date   WBC 7.6 03/16/2017   HGB 14.5 03/16/2017   HCT 42.2 03/16/2017  PLT 254.0 03/16/2017   GLUCOSE 123 (H) 03/16/2017   CHOL 156 03/16/2017   TRIG 99.0 03/16/2017   HDL 36.70 (L) 03/16/2017   LDLDIRECT 156.0 12/06/2012   LDLCALC 100 (H) 03/16/2017   ALT 26 03/16/2017   AST 18 03/16/2017   NA 141 03/16/2017   K 4.0 03/16/2017   CL 107 03/16/2017   CREATININE 0.67 03/16/2017   BUN 9 03/16/2017   CO2 26 03/16/2017   TSH 4.32 03/16/2017   HGBA1C 7.2 (H) 03/16/2017   MICROALBUR 0.8 06/19/2016    Ct Abdomen Pelvis W Contrast  Result Date: 03/12/2017 CLINICAL DATA:  Acute left lower quadrant abdominal pain. EXAM: CT ABDOMEN AND PELVIS WITH CONTRAST TECHNIQUE: Multidetector CT imaging of the abdomen and pelvis was performed using the standard protocol following bolus administration of intravenous contrast. CONTRAST:  157m ISOVUE-300 IOPAMIDOL (ISOVUE-300) INJECTION 61% COMPARISON:  CT scan of July 21, 2015. FINDINGS: Lower chest: No acute abnormality. Hepatobiliary: No focal liver abnormality is seen. No gallstones, gallbladder wall thickening, or biliary dilatation. Pancreas: Unremarkable. No pancreatic ductal dilatation or surrounding inflammatory changes. Spleen: Normal in size without focal abnormality. Adrenals/Urinary Tract: Adrenal glands are unremarkable. Kidneys are normal, without renal calculi, focal lesion, or hydronephrosis. Bladder is unremarkable. Stomach/Bowel: Stomach is within normal limits. Appendix appears normal. No evidence of bowel wall thickening, distention, or inflammatory changes. Sigmoid diverticulosis is noted without inflammation.  Vascular/Lymphatic: Aortic atherosclerosis. No enlarged abdominal or pelvic lymph nodes. Reproductive: Uterus and bilateral adnexa are unremarkable. Other: There is continued presence of mild fat containing periumbilical hernia which demonstrates significantly less inflammation compared to prior exam. No abnormal fluid collection is noted. Musculoskeletal: No acute or significant osseous findings. IMPRESSION: Sigmoid diverticulosis is noted without inflammation. Continued presence of small fat containing periumbilical hernia, which demonstrates significantly less inflammation compared to prior exam. Aortic Atherosclerosis (ICD10-I70.0). Electronically Signed   By: JMarijo Conception M.D.   On: 03/12/2017 16:01       Assessment & Plan:   Problem List Items Addressed This Visit    Abdominal bloating    Reports no bloating or abdominal pain now.  CT as outlined.  Bowels moving.  She wants to hold on any further w/up at this time.  Follow.        Aortic atherosclerosis ([place]    Reported off pravastatin.  Check lipid panel.  D/w her regarding restarting statin.        BMI 37.0-37.9, adult    Discussed diet and exercise.  Follow.  She has adjusted her diet.        Diabetes ([place]    Low carb diet and exercise.  Follow met b and a1c.       Relevant Orders   Hemoglobin AU8E  Basic metabolic panel   Microalbumin / creatinine urine ratio   Hypercholesterolemia    Low cholesterol diet and exercise.  Reported off pravastatin.  Follow lipid panel.        Relevant Orders   Hepatic function panel   Lipid panel   Hypothyroidism    On thyroid replacement.  Follow tsh.       Recurrent UTI    Seeing urology.  Last evaluated 11/2016.  Recommended f/u in 6 months.  Stable.        Vitamin D deficiency    Follow vitamin D level.            SEinar Pheasant MD

## 2017-05-17 ENCOUNTER — Encounter: Payer: Self-pay | Admitting: Internal Medicine

## 2017-05-17 DIAGNOSIS — I700 Atherosclerosis of aorta: Secondary | ICD-10-CM | POA: Insufficient documentation

## 2017-05-17 NOTE — Assessment & Plan Note (Signed)
Low carb diet and exercise.  Follow met b and a1c.  

## 2017-05-17 NOTE — Assessment & Plan Note (Signed)
Reported off pravastatin.  Check lipid panel.  D/w her regarding restarting statin.

## 2017-05-17 NOTE — Assessment & Plan Note (Signed)
Discussed diet and exercise.  Follow.  She has adjusted her diet.

## 2017-05-17 NOTE — Assessment & Plan Note (Signed)
Low cholesterol diet and exercise.  Reported off pravastatin.  Follow lipid panel.

## 2017-05-17 NOTE — Assessment & Plan Note (Signed)
On thyroid replacement.  Follow tsh.  

## 2017-05-17 NOTE — Assessment & Plan Note (Signed)
Reports no bloating or abdominal pain now.  CT as outlined.  Bowels moving.  She wants to hold on any further w/up at this time.  Follow.

## 2017-05-17 NOTE — Assessment & Plan Note (Signed)
Follow vitamin D level.  

## 2017-05-17 NOTE — Assessment & Plan Note (Signed)
Seeing urology.  Last evaluated 11/2016.  Recommended f/u in 6 months.  Stable.

## 2017-06-01 ENCOUNTER — Encounter: Payer: Self-pay | Admitting: Urology

## 2017-06-01 ENCOUNTER — Ambulatory Visit: Payer: Medicare Other | Admitting: Urology

## 2017-06-13 ENCOUNTER — Other Ambulatory Visit: Payer: Self-pay | Admitting: Internal Medicine

## 2017-07-13 ENCOUNTER — Other Ambulatory Visit: Payer: Self-pay | Admitting: Internal Medicine

## 2017-07-29 ENCOUNTER — Other Ambulatory Visit (INDEPENDENT_AMBULATORY_CARE_PROVIDER_SITE_OTHER): Payer: Medicare Other

## 2017-07-29 DIAGNOSIS — E7800 Pure hypercholesterolemia, unspecified: Secondary | ICD-10-CM

## 2017-07-29 DIAGNOSIS — E119 Type 2 diabetes mellitus without complications: Secondary | ICD-10-CM | POA: Diagnosis not present

## 2017-07-29 LAB — BASIC METABOLIC PANEL
BUN: 10 mg/dL (ref 6–23)
CO2: 30 mEq/L (ref 19–32)
CREATININE: 0.76 mg/dL (ref 0.40–1.20)
Calcium: 9.4 mg/dL (ref 8.4–10.5)
Chloride: 105 mEq/L (ref 96–112)
GFR: 78.69 mL/min (ref >60.00)
Glucose, Bld: 147 mg/dL — ABNORMAL HIGH (ref 70–99)
POTASSIUM: 4.1 meq/L (ref 3.5–5.1)
Sodium: 141 mEq/L (ref 135–145)

## 2017-07-29 LAB — HEMOGLOBIN A1C: HEMOGLOBIN A1C: 7.5 % — AB (ref 4.6–6.5)

## 2017-07-29 LAB — HEPATIC FUNCTION PANEL
ALK PHOS: 81 U/L (ref 39–117)
ALT: 16 U/L (ref 0–35)
AST: 14 U/L (ref 0–37)
Albumin: 4.1 g/dL (ref 3.5–5.2)
BILIRUBIN TOTAL: 0.6 mg/dL (ref 0.2–1.2)
Bilirubin, Direct: 0.1 mg/dL (ref 0.0–0.3)
Total Protein: 7.2 g/dL (ref 6.0–8.3)

## 2017-07-29 LAB — LIPID PANEL
CHOLESTEROL: 183 mg/dL (ref 0–200)
HDL: 47.9 mg/dL (ref >39.00)
LDL Cholesterol: 115 mg/dL — ABNORMAL HIGH (ref 0–99)
NonHDL: 134.95
TRIGLYCERIDES: 102 mg/dL (ref 0.0–149.0)
Total CHOL/HDL Ratio: 4
VLDL: 20.4 mg/dL (ref 0.0–40.0)

## 2017-07-29 LAB — MICROALBUMIN / CREATININE URINE RATIO
CREATININE, U: 120.4 mg/dL
MICROALB UR: 1.1 mg/dL (ref 0.0–1.9)
MICROALB/CREAT RATIO: 0.9 mg/g (ref 0.0–30.0)

## 2017-07-31 ENCOUNTER — Encounter: Payer: Self-pay | Admitting: Internal Medicine

## 2017-07-31 ENCOUNTER — Ambulatory Visit: Payer: Medicare Other | Admitting: Internal Medicine

## 2017-07-31 DIAGNOSIS — Z6837 Body mass index (BMI) 37.0-37.9, adult: Secondary | ICD-10-CM | POA: Diagnosis not present

## 2017-07-31 DIAGNOSIS — N390 Urinary tract infection, site not specified: Secondary | ICD-10-CM | POA: Diagnosis not present

## 2017-07-31 DIAGNOSIS — Z8669 Personal history of other diseases of the nervous system and sense organs: Secondary | ICD-10-CM

## 2017-07-31 DIAGNOSIS — I700 Atherosclerosis of aorta: Secondary | ICD-10-CM | POA: Diagnosis not present

## 2017-07-31 DIAGNOSIS — E7800 Pure hypercholesterolemia, unspecified: Secondary | ICD-10-CM | POA: Diagnosis not present

## 2017-07-31 DIAGNOSIS — E039 Hypothyroidism, unspecified: Secondary | ICD-10-CM

## 2017-07-31 DIAGNOSIS — I10 Essential (primary) hypertension: Secondary | ICD-10-CM

## 2017-07-31 DIAGNOSIS — E119 Type 2 diabetes mellitus without complications: Secondary | ICD-10-CM

## 2017-07-31 DIAGNOSIS — E559 Vitamin D deficiency, unspecified: Secondary | ICD-10-CM

## 2017-07-31 MED ORDER — METFORMIN HCL ER 500 MG PO TB24: 500.0000 mg | ORAL_TABLET | Freq: Every day | ORAL | 2 refills | Status: DC

## 2017-07-31 NOTE — Progress Notes (Signed)
Patient ID: Veronica Branch, female   DOB: October 12, 1940, 77 y.o.   MRN: [id number]   Subjective:    Patient ID: Veronica Branch, female    DOB: 1940/06/23, 77 y.o.   MRN: [id number]  HPI  Patient here for a scheduled follow up.  She reports she is doing relatively well.  She has not been exercising.  Not watching her diet.  Sugars increased.  Discussed recent labs.  Elevated a1c.  Discussed the need for medication and further treatment.  Also discussed diet and exercise.  She has declined medication previously.  No chest pain.  No sob.  No acid reflux.  No abdominal pain.  Bowels moving.  Saw urology.  No recent uti's.  Off abx.  Doing well.     Past Medical History:  Diagnosis Date  . Diverticulosis   . GERD (gastroesophageal reflux disease)   . Hiatal hernia   . History of migraine headaches   . Hypercholesterolemia   . Hyperglycemia   . Hypertension   . Hypothyroidism    s/p removal of right thyroid lobe and isthmus (1992)   Past Surgical History:  Procedure Laterality Date  . BREAST BIOPSY Right 2000   benign  . THYROID LOBECTOMY  1992   s/p removal of right thyroid lobe and isthmus   Family History  Problem Relation Age of Onset  . Coronary artery disease Mother        myocardial infarction and CHF  . Breast cancer Mother        24's  . Breast cancer Maternal Aunt        x 2  . Breast cancer Sister   . Breast cancer Cousin   . Colon cancer Neg Hx    Social History   Socioeconomic History  . Marital status: Married    Spouse name: Not on file  . Number of children: 2  . Years of education: Not on file  . Highest education level: Not on file  Occupational History  . Occupation: retired Tour manager  . Financial resource strain: Not on file  . Food insecurity:    Worry: Not on file    Inability: Not on file  . Transportation needs:    Medical: Not on file    Non-medical: Not on file  Tobacco Use  . Smoking status: Never Smoker  . Smokeless  tobacco: Never Used  Substance and Sexual Activity  . Alcohol use: No    Alcohol/week: 0.0 oz  . Drug use: No  . Sexual activity: Yes  Lifestyle  . Physical activity:    Days per week: Not on file    Minutes per session: Not on file  . Stress: Not on file  Relationships  . Social connections:    Talks on phone: Not on file    Gets together: Not on file    Attends religious service: Not on file    Active member of club or organization: Not on file    Attends meetings of clubs or organizations: Not on file    Relationship status: Not on file  Other Topics Concern  . Not on file  Social History Narrative   Regularly exercises. Retired and married.     Outpatient Encounter Medications as of 07/31/2017  Medication Sig  . aspirin 81 MG tablet Take 81 mg by mouth daily.  . Blood Glucose Monitoring Suppl (ONE TOUCH ULTRA SYSTEM KIT) W/DEVICE KIT 1 kit by Does not apply route once.  Marland Kitchen  Lancets (ONETOUCH ULTRASOFT) lancets Use to test blood sugars 1-2 times daily   E11.9  . lisinopril (PRINIVIL,ZESTRIL) 10 MG tablet TAKE 1 TABLET BY MOUTH EVERY DAY  . omeprazole (PRILOSEC) 20 MG capsule TAKE 1 CAPSULE BY MOUTH EVERY DAY  . ONE TOUCH ULTRA TEST test strip TEST BLOOD SUGARS 1 TO 2 TIMES DAILY  . polyethylene glycol (MIRALAX / GLYCOLAX) packet Take 17 g by mouth daily. Mix one tablespoon with 8oz of your favorite juice or water every day until you are having soft formed stools. Then start taking once daily if you didn't have a stool the day before.  . Probiotic Product (PROBIOTIC PO) Take 1 tablet by mouth daily.  . propranolol (INDERAL) 40 MG tablet TAKE 1 TABLET BY MOUTH TWICE A DAY  . SYNTHROID 100 MCG tablet TAKE 1 TABLET BY MOUTH EVERY DAY  . metFORMIN (GLUCOPHAGE XR) 500 MG 24 hr tablet Take 1 tablet (500 mg total) by mouth daily with breakfast.   No facility-administered encounter medications on file as of 07/31/2017.     Review of Systems  Constitutional: Negative for appetite  change and unexpected weight change.  HENT: Negative for congestion and sinus pressure.   Respiratory: Negative for cough, chest tightness and shortness of breath.   Cardiovascular: Negative for chest pain, palpitations and leg swelling.  Gastrointestinal: Negative for abdominal pain, diarrhea, nausea and vomiting.  Genitourinary: Negative for difficulty urinating and dysuria.  Musculoskeletal: Negative for joint swelling and myalgias.  Skin: Negative for color change and rash.  Neurological: Negative for dizziness, light-headedness and headaches.       Headaches doing much better.    Psychiatric/Behavioral: Negative for agitation and dysphoric mood.       Objective:    Physical Exam  Constitutional: She appears well-developed and well-nourished. No distress.  HENT:  Nose: Nose normal.  Mouth/Throat: Oropharynx is clear and moist.  Neck: Neck supple. No thyromegaly present.  Cardiovascular: Normal rate and regular rhythm.  Pulmonary/Chest: Breath sounds normal. No respiratory distress. She has no wheezes.  Abdominal: Soft. Bowel sounds are normal. There is no tenderness.  Musculoskeletal: She exhibits no edema or tenderness.  Lymphadenopathy:    She has no cervical adenopathy.  Skin: No rash noted. No erythema.  Psychiatric: She has a normal mood and affect. Her behavior is normal.    BP 120/68 (BP Location: Left Arm, Patient Position: Sitting, Cuff Size: Normal)   Pulse 70   Wt 193 lb 6.4 oz (87.7 kg)   SpO2 95%   BMI 37.77 kg/m  Wt Readings from Last 3 Encounters:  07/31/17 193 lb 6.4 oz (87.7 kg)  05/14/17 193 lb 3.2 oz (87.6 kg)  03/18/17 193 lb (87.5 kg)     Lab Results  Component Value Date   WBC 7.6 03/16/2017   HGB 14.5 03/16/2017   HCT 42.2 03/16/2017   PLT 254.0 03/16/2017   GLUCOSE 147 (H) 07/29/2017   CHOL 183 07/29/2017   TRIG 102.0 07/29/2017   HDL 47.90 07/29/2017   LDLDIRECT 156.0 12/06/2012   LDLCALC 115 (H) 07/29/2017   ALT 16 07/29/2017    AST 14 07/29/2017   NA 141 07/29/2017   K 4.1 07/29/2017   CL 105 07/29/2017   CREATININE 0.76 07/29/2017   BUN 10 07/29/2017   CO2 30 07/29/2017   TSH 4.32 03/16/2017   HGBA1C 7.5 (H) 07/29/2017   MICROALBUR 1.1 07/29/2017    Ct Abdomen Pelvis W Contrast  Result Date: 03/12/2017 CLINICAL DATA:  Acute  left lower quadrant abdominal pain. EXAM: CT ABDOMEN AND PELVIS WITH CONTRAST TECHNIQUE: Multidetector CT imaging of the abdomen and pelvis was performed using the standard protocol following bolus administration of intravenous contrast. CONTRAST:  1101m ISOVUE-300 IOPAMIDOL (ISOVUE-300) INJECTION 61% COMPARISON:  CT scan of July 21, 2015. FINDINGS: Lower chest: No acute abnormality. Hepatobiliary: No focal liver abnormality is seen. No gallstones, gallbladder wall thickening, or biliary dilatation. Pancreas: Unremarkable. No pancreatic ductal dilatation or surrounding inflammatory changes. Spleen: Normal in size without focal abnormality. Adrenals/Urinary Tract: Adrenal glands are unremarkable. Kidneys are normal, without renal calculi, focal lesion, or hydronephrosis. Bladder is unremarkable. Stomach/Bowel: Stomach is within normal limits. Appendix appears normal. No evidence of bowel wall thickening, distention, or inflammatory changes. Sigmoid diverticulosis is noted without inflammation. Vascular/Lymphatic: Aortic atherosclerosis. No enlarged abdominal or pelvic lymph nodes. Reproductive: Uterus and bilateral adnexa are unremarkable. Other: There is continued presence of mild fat containing periumbilical hernia which demonstrates significantly less inflammation compared to prior exam. No abnormal fluid collection is noted. Musculoskeletal: No acute or significant osseous findings. IMPRESSION: Sigmoid diverticulosis is noted without inflammation. Continued presence of small fat containing periumbilical hernia, which demonstrates significantly less inflammation compared to prior exam. Aortic  Atherosclerosis (ICD10-I70.0). Electronically Signed   By: JMarijo Conception M.D.   On: 03/12/2017 16:01       Assessment & Plan:   Problem List Items Addressed This Visit    Aortic atherosclerosis ([place]    Off cholesterol medication.  She does not want to restart cholesterol medication at this time.  Follow.        BMI 37.0-37.9, adult    Discussed diet and exercise.        Diabetes ([place]    Low carb diet and exercise.  a1c 7.5 recently.  Discussed the need for further treatment.  Start metformin XR 5046mq day.  Follow sugars.  Follow metabolic panel and a1C3K       Relevant Medications   metFORMIN (GLUCOPHAGE XR) 500 MG 24 hr tablet   Other Relevant Orders   Hemoglobin A1F8M Basic metabolic panel   History of migraine headaches    Doing well on current regimen.  Follow.        Hypercholesterolemia    Off cholesterol medication. Low cholesterol diet and exercise.  Declines cholesterol medication.  Follow lipid panel.        Relevant Orders   Lipid panel   Hepatic function panel   Hypertension    Blood pressure under good control.  Continue same medication regimen.  Follow pressures.  Follow metabolic panel.        Hypothyroidism    On thyroid replacement.  Follow tsh.        Relevant Orders   TSH   Recurrent UTI    Not an issue recently.  Off abx.  Has seen urology.        Vitamin D deficiency    Follow vitamin D level.            SCEinar PheasantMD

## 2017-08-03 ENCOUNTER — Encounter: Payer: Self-pay | Admitting: Internal Medicine

## 2017-08-03 NOTE — Assessment & Plan Note (Signed)
Not an issue recently.  Off abx.  Has seen urology.

## 2017-08-03 NOTE — Assessment & Plan Note (Signed)
Blood pressure under good control.  Continue same medication regimen.  Follow pressures.  Follow metabolic panel.   

## 2017-08-03 NOTE — Assessment & Plan Note (Signed)
Low carb diet and exercise.  a1c 7.5 recently.  Discussed the need for further treatment.  Start metformin XR 500mg  q day.  Follow sugars.  Follow metabolic panel and a1c.

## 2017-08-03 NOTE — Assessment & Plan Note (Signed)
On thyroid replacement.  Follow tsh.  

## 2017-08-03 NOTE — Assessment & Plan Note (Signed)
Off cholesterol medication. Low cholesterol diet and exercise.  Declines cholesterol medication.  Follow lipid panel.

## 2017-08-03 NOTE — Assessment & Plan Note (Signed)
Discussed diet and exercise 

## 2017-08-03 NOTE — Assessment & Plan Note (Signed)
Follow vitamin D level.  

## 2017-08-03 NOTE — Assessment & Plan Note (Signed)
Off cholesterol medication.  She does not want to restart cholesterol medication at this time.  Follow.

## 2017-08-03 NOTE — Assessment & Plan Note (Signed)
Doing well on current regimen.  Follow.   

## 2017-10-22 ENCOUNTER — Other Ambulatory Visit: Payer: Self-pay | Admitting: Internal Medicine

## 2017-11-18 ENCOUNTER — Other Ambulatory Visit: Payer: Self-pay | Admitting: Internal Medicine

## 2017-11-19 ENCOUNTER — Other Ambulatory Visit: Payer: Self-pay | Admitting: Internal Medicine

## 2017-11-24 ENCOUNTER — Other Ambulatory Visit: Payer: Self-pay | Admitting: Internal Medicine

## 2017-11-26 ENCOUNTER — Ambulatory Visit: Payer: Medicare Other | Admitting: Internal Medicine

## 2017-12-02 ENCOUNTER — Other Ambulatory Visit (INDEPENDENT_AMBULATORY_CARE_PROVIDER_SITE_OTHER): Payer: Medicare Other

## 2017-12-02 ENCOUNTER — Ambulatory Visit (INDEPENDENT_AMBULATORY_CARE_PROVIDER_SITE_OTHER): Payer: Medicare Other

## 2017-12-02 DIAGNOSIS — E119 Type 2 diabetes mellitus without complications: Secondary | ICD-10-CM | POA: Diagnosis not present

## 2017-12-02 DIAGNOSIS — E039 Hypothyroidism, unspecified: Secondary | ICD-10-CM | POA: Diagnosis not present

## 2017-12-02 DIAGNOSIS — Z23 Encounter for immunization: Secondary | ICD-10-CM | POA: Diagnosis not present

## 2017-12-02 DIAGNOSIS — E7800 Pure hypercholesterolemia, unspecified: Secondary | ICD-10-CM | POA: Diagnosis not present

## 2017-12-02 LAB — BASIC METABOLIC PANEL
BUN: 8 mg/dL (ref 6–23)
CHLORIDE: 107 meq/L (ref 96–112)
CO2: 29 meq/L (ref 19–32)
Calcium: 9.3 mg/dL (ref 8.4–10.5)
Creatinine, Ser: 0.69 mg/dL (ref 0.40–1.20)
GFR: 87.89 mL/min (ref >60.00)
Glucose, Bld: 119 mg/dL — ABNORMAL HIGH (ref 70–99)
POTASSIUM: 4.1 meq/L (ref 3.5–5.1)
SODIUM: 141 meq/L (ref 135–145)

## 2017-12-02 LAB — HEPATIC FUNCTION PANEL
ALK PHOS: 62 U/L (ref 39–117)
ALT: 15 U/L (ref 0–35)
AST: 12 U/L (ref 0–37)
Albumin: 4.2 g/dL (ref 3.5–5.2)
BILIRUBIN DIRECT: 0.1 mg/dL (ref 0.0–0.3)
BILIRUBIN TOTAL: 0.4 mg/dL (ref 0.2–1.2)
Total Protein: 6.9 g/dL (ref 6.0–8.3)

## 2017-12-02 LAB — LIPID PANEL
CHOL/HDL RATIO: 4
Cholesterol: 166 mg/dL (ref 0–200)
HDL: 45.3 mg/dL (ref >39.00)
LDL CALC: 102 mg/dL — AB (ref 0–99)
NONHDL: 121.01
Triglycerides: 95 mg/dL (ref 0.0–149.0)
VLDL: 19 mg/dL (ref 0.0–40.0)

## 2017-12-02 LAB — TSH: TSH: 1.47 u[IU]/mL (ref 0.35–4.50)

## 2017-12-02 LAB — HEMOGLOBIN A1C: HEMOGLOBIN A1C: 6.9 % — AB (ref 4.6–6.5)

## 2017-12-03 ENCOUNTER — Other Ambulatory Visit: Payer: Self-pay | Admitting: Internal Medicine

## 2017-12-07 ENCOUNTER — Encounter: Payer: Self-pay | Admitting: Internal Medicine

## 2017-12-07 ENCOUNTER — Ambulatory Visit: Payer: Medicare Other | Admitting: Internal Medicine

## 2017-12-07 VITALS — BP 134/78 | HR 59 | Temp 97.7°F | Resp 18 | Wt 191.4 lb

## 2017-12-07 DIAGNOSIS — I10 Essential (primary) hypertension: Secondary | ICD-10-CM

## 2017-12-07 DIAGNOSIS — Z23 Encounter for immunization: Secondary | ICD-10-CM | POA: Diagnosis not present

## 2017-12-07 DIAGNOSIS — Z1231 Encounter for screening mammogram for malignant neoplasm of breast: Secondary | ICD-10-CM

## 2017-12-07 DIAGNOSIS — I700 Atherosclerosis of aorta: Secondary | ICD-10-CM

## 2017-12-07 DIAGNOSIS — Z6837 Body mass index (BMI) 37.0-37.9, adult: Secondary | ICD-10-CM | POA: Diagnosis not present

## 2017-12-07 DIAGNOSIS — E7800 Pure hypercholesterolemia, unspecified: Secondary | ICD-10-CM

## 2017-12-07 DIAGNOSIS — E119 Type 2 diabetes mellitus without complications: Secondary | ICD-10-CM

## 2017-12-07 DIAGNOSIS — E559 Vitamin D deficiency, unspecified: Secondary | ICD-10-CM

## 2017-12-07 DIAGNOSIS — E039 Hypothyroidism, unspecified: Secondary | ICD-10-CM

## 2017-12-07 NOTE — Progress Notes (Signed)
Patient ID: Veronica Branch, female   DOB: Oct 19, 1940, 77 y.o.   MRN: [id number]   Subjective:    Patient ID: Veronica Branch, female    DOB: Mar 30, 1940, 77 y.o.   MRN: [id number]  HPI  Patient here for a scheduled follow up.  She reports she is doing relatively well.  Taking metformin.  States when she first started taking the medication, had some GI issues.  Lasted for a couple of weeks.  Is better now.  Some bloating.  Discussed miralax.  Overall improved and she wants to continue on metformin.  a1c better.  Discussed diet and exercise.  She is not exercising.  No chest pain.  No sob.  No acid reflux.     Past Medical History:  Diagnosis Date  . Diverticulosis   . GERD (gastroesophageal reflux disease)   . Hiatal hernia   . History of migraine headaches   . Hypercholesterolemia   . Hyperglycemia   . Hypertension   . Hypothyroidism    s/p removal of right thyroid lobe and isthmus (1992)   Past Surgical History:  Procedure Laterality Date  . BREAST BIOPSY Right 2000   benign  . THYROID LOBECTOMY  1992   s/p removal of right thyroid lobe and isthmus   Family History  Problem Relation Age of Onset  . Coronary artery disease Mother        myocardial infarction and CHF  . Breast cancer Mother        68's  . Breast cancer Maternal Aunt        x 2  . Breast cancer Sister   . Breast cancer Cousin   . Colon cancer Neg Hx    Social History   Socioeconomic History  . Marital status: Married    Spouse name: Not on file  . Number of children: 2  . Years of education: Not on file  . Highest education level: Not on file  Occupational History  . Occupation: retired Tour manager  . Financial resource strain: Not on file  . Food insecurity:    Worry: Not on file    Inability: Not on file  . Transportation needs:    Medical: Not on file    Non-medical: Not on file  Tobacco Use  . Smoking status: Never Smoker  . Smokeless tobacco: Never Used  Substance and  Sexual Activity  . Alcohol use: No    Alcohol/week: 0.0 standard drinks  . Drug use: No  . Sexual activity: Yes  Lifestyle  . Physical activity:    Days per week: Not on file    Minutes per session: Not on file  . Stress: Not on file  Relationships  . Social connections:    Talks on phone: Not on file    Gets together: Not on file    Attends religious service: Not on file    Active member of club or organization: Not on file    Attends meetings of clubs or organizations: Not on file    Relationship status: Not on file  Other Topics Concern  . Not on file  Social History Narrative   Regularly exercises. Retired and married.     Outpatient Encounter Medications as of 12/07/2017  Medication Sig  . aspirin 81 MG tablet Take 81 mg by mouth daily.  . Blood Glucose Monitoring Suppl (ONE TOUCH ULTRA SYSTEM KIT) W/DEVICE KIT 1 kit by Does not apply route once.  . Lancets (ONETOUCH ULTRASOFT) lancets Use  to test blood sugars 1-2 times daily   E11.9  . lisinopril (PRINIVIL,ZESTRIL) 10 MG tablet TAKE 1 TABLET BY MOUTH EVERY DAY  . metFORMIN (GLUCOPHAGE-XR) 500 MG 24 hr tablet TAKE 1 TABLET BY MOUTH EVERY DAY WITH BREAKFAST  . omeprazole (PRILOSEC) 20 MG capsule TAKE 1 CAPSULE BY MOUTH EVERY DAY  . ONE TOUCH ULTRA TEST test strip TEST BLOOD SUGARS 1 TO 2 TIMES DAILY  . polyethylene glycol (MIRALAX / GLYCOLAX) packet Take 17 g by mouth daily. Mix one tablespoon with 8oz of your favorite juice or water every day until you are having soft formed stools. Then start taking once daily if you didn't have a stool the day before.  . Probiotic Product (PROBIOTIC PO) Take 1 tablet by mouth daily.  . propranolol (INDERAL) 40 MG tablet TAKE 1 TABLET BY MOUTH TWICE A DAY  . SYNTHROID 100 MCG tablet TAKE 1 TABLET BY MOUTH EVERY DAY  . [DISCONTINUED] SYNTHROID 100 MCG tablet TAKE 1 TABLET BY MOUTH EVERY DAY   No facility-administered encounter medications on file as of 12/07/2017.     Review of Systems    Constitutional: Negative for appetite change and unexpected weight change.  HENT: Negative for congestion and sinus pressure.   Respiratory: Negative for cough, chest tightness and shortness of breath.   Cardiovascular: Negative for chest pain, palpitations and leg swelling.  Gastrointestinal: Negative for abdominal pain, nausea and vomiting.       Bowels better.   Genitourinary: Negative for difficulty urinating and dysuria.  Musculoskeletal: Negative for joint swelling and myalgias.  Skin: Negative for color change and rash.  Neurological: Negative for dizziness, light-headedness and headaches.  Psychiatric/Behavioral: Negative for agitation and dysphoric mood.       Objective:     Blood pressure rechecked by me:  128/78  Physical Exam  Constitutional: She appears well-developed and well-nourished. No distress.  HENT:  Nose: Nose normal.  Mouth/Throat: Oropharynx is clear and moist.  Neck: Neck supple. No thyromegaly present.  Cardiovascular: Normal rate and regular rhythm.  Pulmonary/Chest: Breath sounds normal. No respiratory distress. She has no wheezes.  Abdominal: Soft. Bowel sounds are normal. There is no tenderness.  Musculoskeletal: She exhibits no edema or tenderness.  Lymphadenopathy:    She has no cervical adenopathy.  Skin: No rash noted. No erythema.  Psychiatric: She has a normal mood and affect. Her behavior is normal.    BP 134/78 (BP Location: Left Arm, Patient Position: Sitting, Cuff Size: Normal)   Pulse (!) 59   Temp 97.7 F (36.5 C) (Oral)   Resp 18   Wt 191 lb 6.4 oz (86.8 kg)   SpO2 98%   BMI 37.38 kg/m  Wt Readings from Last 3 Encounters:  12/07/17 191 lb 6.4 oz (86.8 kg)  07/31/17 193 lb 6.4 oz (87.7 kg)  05/14/17 193 lb 3.2 oz (87.6 kg)     Lab Results  Component Value Date   WBC 7.6 03/16/2017   HGB 14.5 03/16/2017   HCT 42.2 03/16/2017   PLT 254.0 03/16/2017   GLUCOSE 119 (H) 12/02/2017   CHOL 166 12/02/2017   TRIG 95.0  12/02/2017   HDL 45.30 12/02/2017   LDLDIRECT 156.0 12/06/2012   LDLCALC 102 (H) 12/02/2017   ALT 15 12/02/2017   AST 12 12/02/2017   NA 141 12/02/2017   K 4.1 12/02/2017   CL 107 12/02/2017   CREATININE 0.69 12/02/2017   BUN 8 12/02/2017   CO2 29 12/02/2017   TSH 1.47 12/02/2017  HGBA1C 6.9 (H) 12/02/2017   MICROALBUR 1.1 07/29/2017    Ct Abdomen Pelvis W Contrast  Result Date: 03/12/2017 CLINICAL DATA:  Acute left lower quadrant abdominal pain. EXAM: CT ABDOMEN AND PELVIS WITH CONTRAST TECHNIQUE: Multidetector CT imaging of the abdomen and pelvis was performed using the standard protocol following bolus administration of intravenous contrast. CONTRAST:  120m ISOVUE-300 IOPAMIDOL (ISOVUE-300) INJECTION 61% COMPARISON:  CT scan of July 21, 2015. FINDINGS: Lower chest: No acute abnormality. Hepatobiliary: No focal liver abnormality is seen. No gallstones, gallbladder wall thickening, or biliary dilatation. Pancreas: Unremarkable. No pancreatic ductal dilatation or surrounding inflammatory changes. Spleen: Normal in size without focal abnormality. Adrenals/Urinary Tract: Adrenal glands are unremarkable. Kidneys are normal, without renal calculi, focal lesion, or hydronephrosis. Bladder is unremarkable. Stomach/Bowel: Stomach is within normal limits. Appendix appears normal. No evidence of bowel wall thickening, distention, or inflammatory changes. Sigmoid diverticulosis is noted without inflammation. Vascular/Lymphatic: Aortic atherosclerosis. No enlarged abdominal or pelvic lymph nodes. Reproductive: Uterus and bilateral adnexa are unremarkable. Other: There is continued presence of mild fat containing periumbilical hernia which demonstrates significantly less inflammation compared to prior exam. No abnormal fluid collection is noted. Musculoskeletal: No acute or significant osseous findings. IMPRESSION: Sigmoid diverticulosis is noted without inflammation. Continued presence of small fat  containing periumbilical hernia, which demonstrates significantly less inflammation compared to prior exam. Aortic Atherosclerosis (ICD10-I70.0). Electronically Signed   By: JMarijo Conception M.D.   On: 03/12/2017 16:01       Assessment & Plan:   Problem List Items Addressed This Visit    [place] atherosclerosis ([place]    She declines cholesterol medication.        BMI 37.0-37.9, adult    Discussed diet and exercise.  Follow.        Diabetes ([facility]    Discussed diet and exercise.  On metformin.  Feels tolerating better.  Wants to remain on the medication.  Follow met b and a1c.        Relevant Orders   Hemoglobin AY1O  Basic metabolic panel   Hypercholesterolemia    Off cholesterol medication.  Discussed calculated cholesterol risk and desire to restart.  She declines.  Low cholesterol diet and exercise.  Follow lipid panel.        Relevant Orders   Hepatic function panel   Lipid panel   Hypertension    Blood pressure under good control.  Continue same medication regimen.  Follow pressures.  Follow metabolic panel.        Relevant Orders   CBC with Differential/Platelet   Hypothyroidism    On thyroid replacement.  Follow tsh.       Vitamin D deficiency    Follow vitamin level.         Other Visit Diagnoses    Visit for screening mammogram    -  Primary   Relevant Orders   MM 3D SCREEN BREAST BILATERAL   Need for 23-polyvalent pneumococcal polysaccharide vaccine       Relevant Orders   Pneumococcal polysaccharide vaccine 23-valent greater than or equal to 2yo subcutaneous/IM (Completed)       CEinar Pheasant MD

## 2017-12-13 ENCOUNTER — Encounter: Payer: Self-pay | Admitting: Internal Medicine

## 2017-12-13 NOTE — Assessment & Plan Note (Signed)
Discussed diet and exercise.  Follow.  

## 2017-12-13 NOTE — Assessment & Plan Note (Signed)
Blood pressure under good control.  Continue same medication regimen.  Follow pressures.  Follow metabolic panel.   

## 2017-12-13 NOTE — Assessment & Plan Note (Signed)
Off cholesterol medication.  Discussed calculated cholesterol risk and desire to restart.  She declines.  Low cholesterol diet and exercise.  Follow lipid panel.

## 2017-12-13 NOTE — Assessment & Plan Note (Signed)
Discussed diet and exercise.  On metformin.  Feels tolerating better.  Wants to remain on the medication.  Follow met b and a1c.

## 2017-12-13 NOTE — Assessment & Plan Note (Signed)
Follow vitamin level.  

## 2017-12-13 NOTE — Assessment & Plan Note (Signed)
On thyroid replacement.  Follow tsh.  

## 2017-12-13 NOTE — Assessment & Plan Note (Signed)
She declines cholesterol medication.   

## 2018-01-19 ENCOUNTER — Ambulatory Visit
Admission: RE | Admit: 2018-01-19 | Discharge: 2018-01-19 | Disposition: A | Discharge status: Home / Self Care | Payer: Medicare Other | Source: Ambulatory Visit | Attending: Internal Medicine | Admitting: Internal Medicine

## 2018-01-19 DIAGNOSIS — Z1231 Encounter for screening mammogram for malignant neoplasm of breast: Secondary | ICD-10-CM | POA: Diagnosis present

## 2018-01-30 ENCOUNTER — Other Ambulatory Visit: Payer: Self-pay | Admitting: Internal Medicine

## 2018-03-21 ENCOUNTER — Other Ambulatory Visit: Payer: Self-pay | Admitting: Internal Medicine

## 2018-04-05 ENCOUNTER — Other Ambulatory Visit: Payer: Medicare Other

## 2018-04-15 ENCOUNTER — Telehealth: Payer: Self-pay

## 2018-04-15 NOTE — Telephone Encounter (Signed)
Copied from CRM [id number]. Topic: Appointment Scheduling - Scheduling Inquiry for Clinic >> Apr 15, 2018 10:52 AM Burchel, Abbi R wrote: Pt would like to resch her CPE sometime in May/June.  Please call pt to sched.   [phone]

## 2018-04-19 ENCOUNTER — Other Ambulatory Visit: Payer: Medicare Other

## 2018-04-19 NOTE — Telephone Encounter (Signed)
Left the patient a message to call the office to reschedule her physical in May/June.

## 2018-04-21 ENCOUNTER — Encounter: Payer: Medicare Other | Admitting: Internal Medicine

## 2018-05-05 ENCOUNTER — Other Ambulatory Visit: Payer: Self-pay | Admitting: Internal Medicine

## 2018-06-05 IMAGING — MG MM DIGITAL SCREENING BILAT W/ TOMO W/ CAD
8 of 13 series · 8 of 29 positions shown · non-contrast
Comparison: Previous exam(s).

CLINICAL DATA: Screening.

EXAM:
2D DIGITAL SCREENING BILATERAL MAMMOGRAM WITH CAD AND ADJUNCT TOMO

[R XCCL]
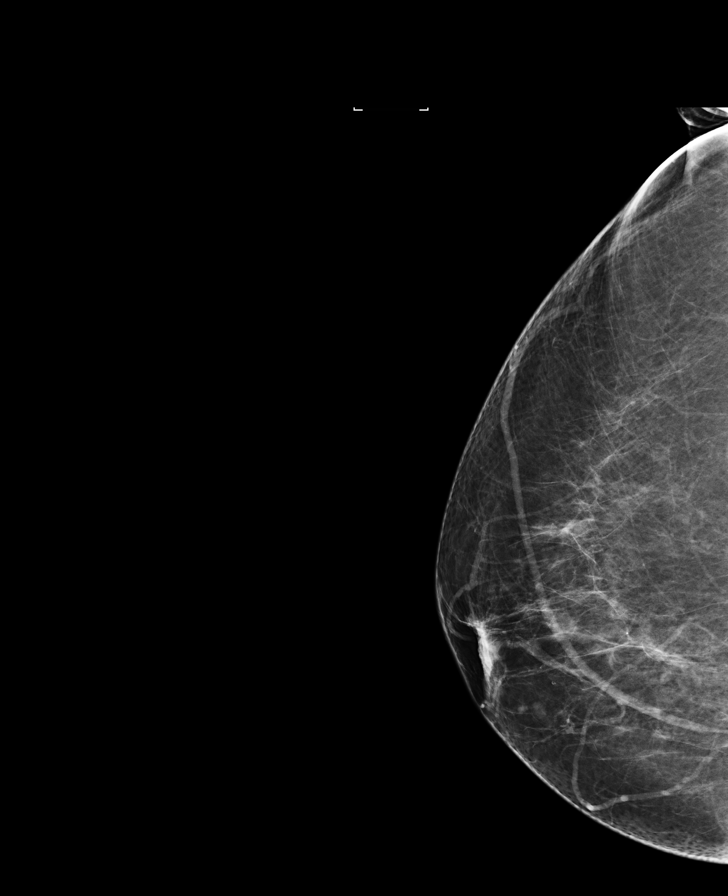

[R CC synth-2D]
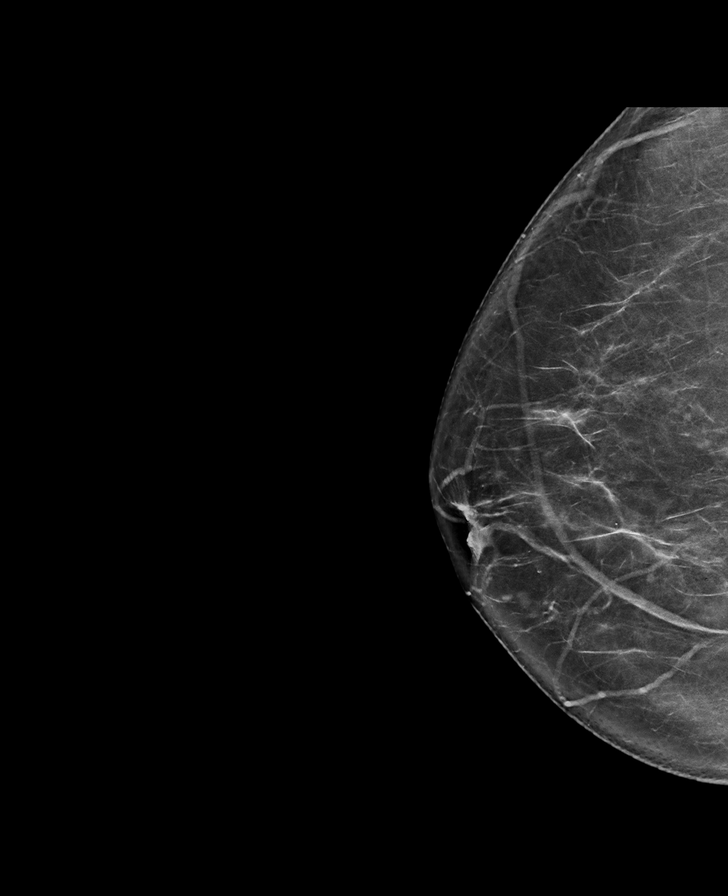

[R MLO synth-2D]
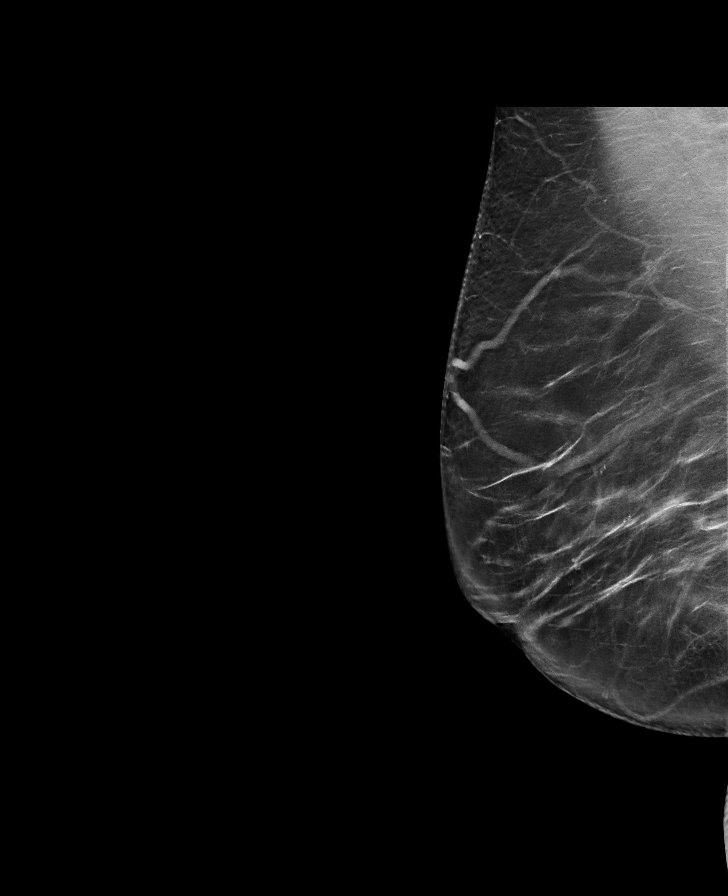

[R MLO]
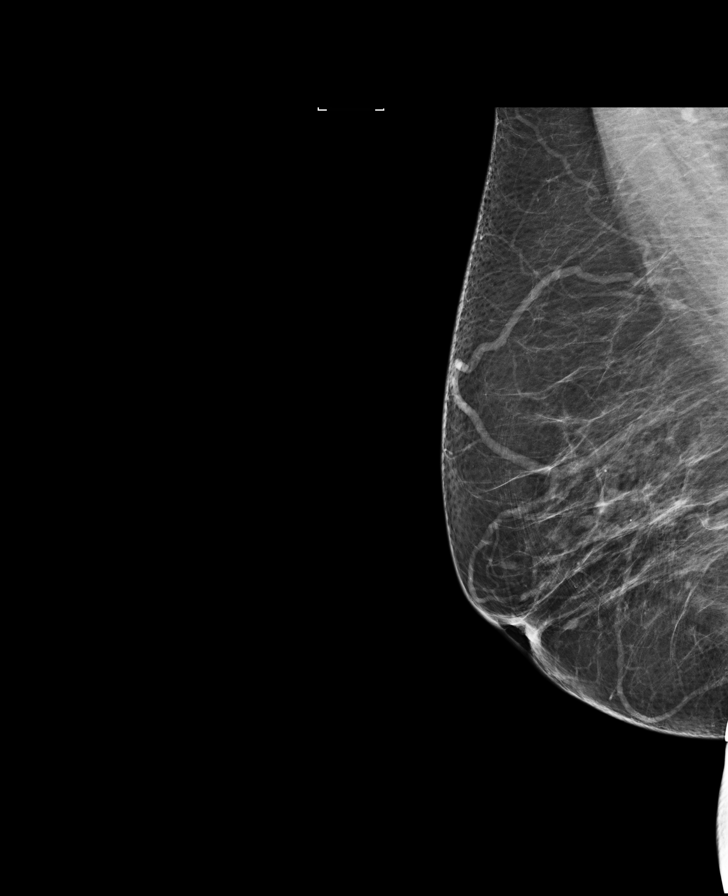

[R CC]
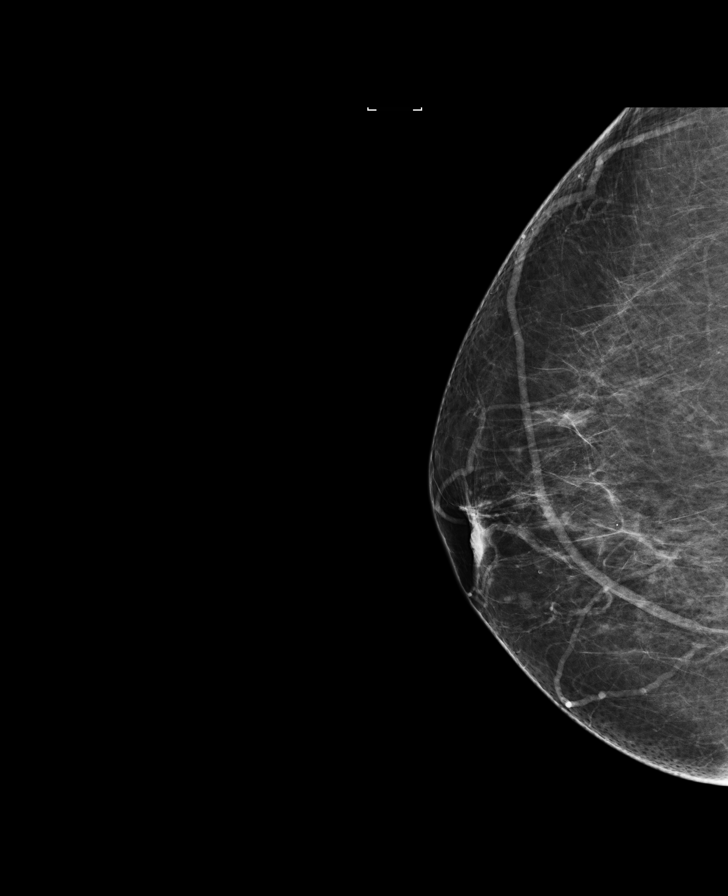

[L MLO synth-2D]
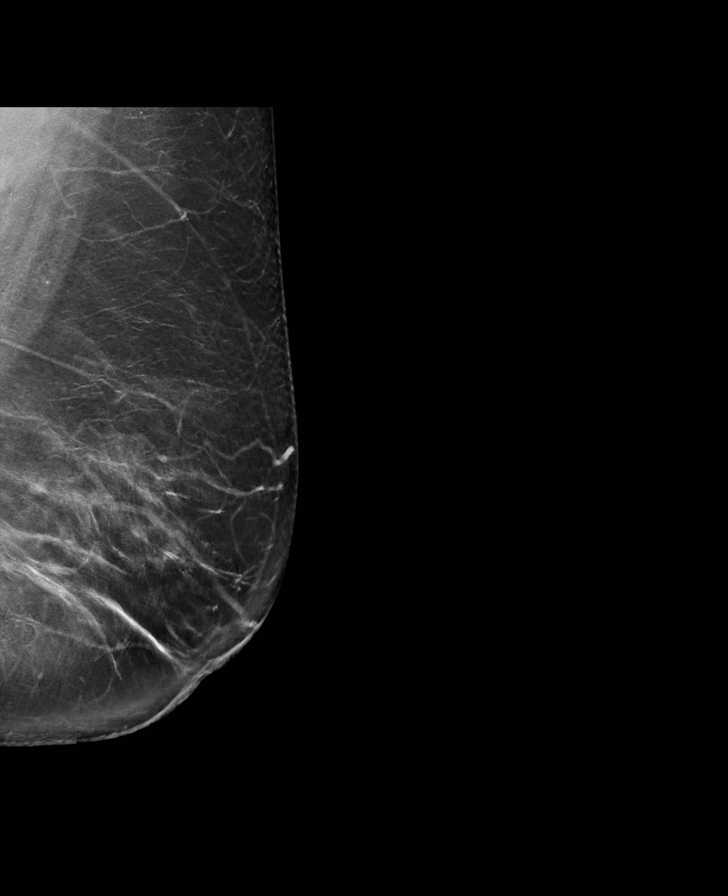

[L CC]
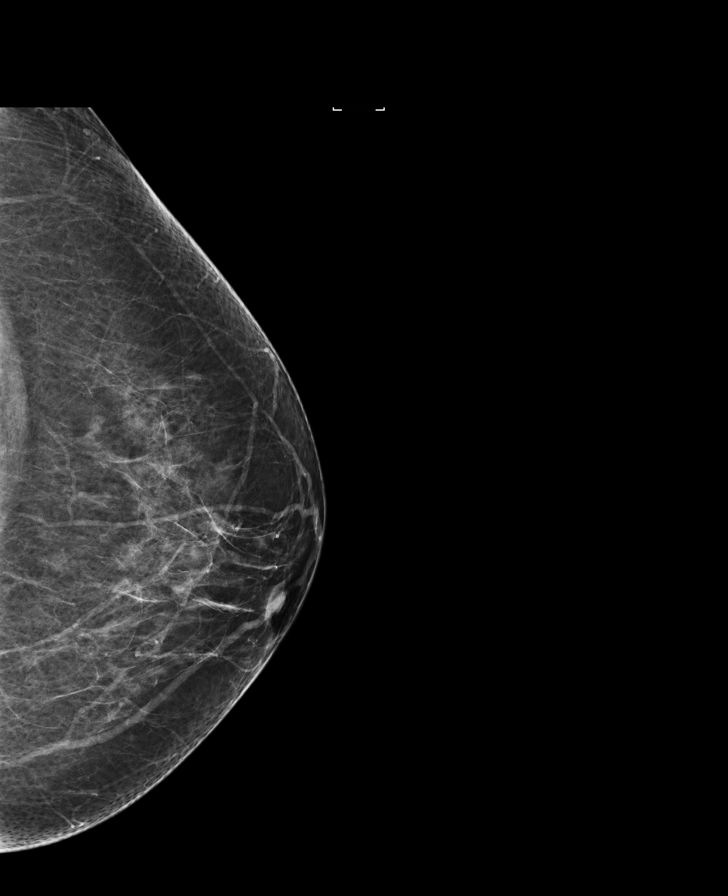

[L CC synth-2D]
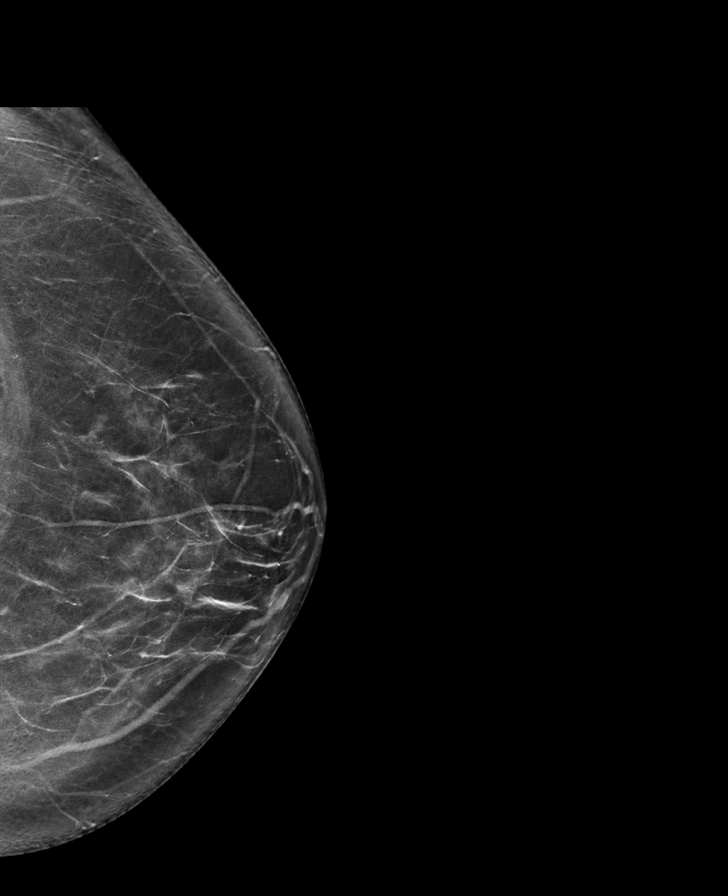

[8 of 29 positions shown; findings below may reference images not displayed]

ACR Breast Density Category b: There are scattered areas of
fibroglandular density.
FINDINGS: There are no findings suspicious for malignancy. Images were
processed with CAD.
IMPRESSION: No mammographic evidence of malignancy. A result letter of this
screening mammogram will be mailed directly to the patient.

RECOMMENDATION:
Screening mammogram in one year. (Code:97-6-RS4)

BI-RADS CATEGORY  1: Negative.

## 2018-07-31 ENCOUNTER — Other Ambulatory Visit: Payer: Self-pay | Admitting: Internal Medicine

## 2018-08-02 NOTE — Telephone Encounter (Signed)
Refill request for metformin extended release.  

## 2018-08-02 NOTE — Telephone Encounter (Signed)
Patient needs to check with her pharmacy to see If her particular  metformin xr has been recalled.  Ok to refill if they confirm that hers has not been recalled

## 2018-08-05 NOTE — Telephone Encounter (Signed)
Spoke with pt and she stated that she would call the pharmacy and then call us back.

## 2018-08-05 NOTE — Telephone Encounter (Signed)
Spoke with pt and she stated that the one has now is not one of the ones that has been recalled. Pt stated that she still has a month left of the medication and does not need a refill at this time.

## 2018-09-16 ENCOUNTER — Other Ambulatory Visit: Payer: Self-pay | Admitting: Internal Medicine

## 2018-09-20 ENCOUNTER — Other Ambulatory Visit: Payer: Self-pay

## 2018-09-20 ENCOUNTER — Other Ambulatory Visit (INDEPENDENT_AMBULATORY_CARE_PROVIDER_SITE_OTHER): Payer: Medicare Other

## 2018-09-20 DIAGNOSIS — Z23 Encounter for immunization: Secondary | ICD-10-CM

## 2018-09-20 DIAGNOSIS — E119 Type 2 diabetes mellitus without complications: Secondary | ICD-10-CM

## 2018-09-20 DIAGNOSIS — I10 Essential (primary) hypertension: Secondary | ICD-10-CM | POA: Diagnosis not present

## 2018-09-20 DIAGNOSIS — E7800 Pure hypercholesterolemia, unspecified: Secondary | ICD-10-CM | POA: Diagnosis not present

## 2018-09-20 LAB — HEPATIC FUNCTION PANEL
ALT: 15 U/L (ref 0–35)
AST: 13 U/L (ref 0–37)
Albumin: 4 g/dL (ref 3.5–5.2)
Alkaline Phosphatase: 66 U/L (ref 39–117)
Bilirubin, Direct: 0.1 mg/dL (ref 0.0–0.3)
Total Bilirubin: 0.4 mg/dL (ref 0.2–1.2)
Total Protein: 6.8 g/dL (ref 6.0–8.3)

## 2018-09-20 LAB — CBC WITH DIFFERENTIAL/PLATELET
Basophils Absolute: 0.1 10*3/uL (ref 0.0–0.1)
Basophils Relative: 1.2 % (ref 0.0–3.0)
Eosinophils Absolute: 0.2 10*3/uL (ref 0.0–0.7)
Eosinophils Relative: 2.3 % (ref 0.0–5.0)
HCT: 41.4 % (ref 36.0–46.0)
Hemoglobin: 13.8 g/dL (ref 12.0–15.0)
Lymphocytes Relative: 25.2 % (ref 12.0–46.0)
Lymphs Abs: 1.8 10*3/uL (ref 0.7–4.0)
MCHC: 33.3 g/dL (ref 30.0–36.0)
MCV: 91.9 fl (ref 78.0–100.0)
Monocytes Absolute: 0.5 10*3/uL (ref 0.1–1.0)
Monocytes Relative: 6.9 % (ref 3.0–12.0)
Neutro Abs: 4.6 10*3/uL (ref 1.4–7.7)
Neutrophils Relative %: 64.4 % (ref 43.0–77.0)
Platelets: 246 10*3/uL (ref 150.0–400.0)
RBC: 4.5 Mil/uL (ref 3.87–5.11)
RDW: 12.7 % (ref 11.5–15.5)
WBC: 7.2 10*3/uL (ref 4.0–10.5)

## 2018-09-20 LAB — LIPID PANEL
Cholesterol: 160 mg/dL (ref 0–200)
HDL: 46.1 mg/dL (ref >39.00)
LDL Cholesterol: 95 mg/dL (ref 0–99)
NonHDL: 113.98
Total CHOL/HDL Ratio: 3
Triglycerides: 95 mg/dL (ref 0.0–149.0)
VLDL: 19 mg/dL (ref 0.0–40.0)

## 2018-09-20 LAB — BASIC METABOLIC PANEL
BUN: 7 mg/dL (ref 6–23)
CO2: 27 mEq/L (ref 19–32)
Calcium: 9.1 mg/dL (ref 8.4–10.5)
Chloride: 108 mEq/L (ref 96–112)
Creatinine, Ser: 0.66 mg/dL (ref 0.40–1.20)
GFR: 86.86 mL/min (ref >60.00)
Glucose, Bld: 114 mg/dL — ABNORMAL HIGH (ref 70–99)
Potassium: 3.8 mEq/L (ref 3.5–5.1)
Sodium: 142 mEq/L (ref 135–145)

## 2018-09-20 LAB — HEMOGLOBIN A1C: Hgb A1c MFr Bld: 6.9 % — ABNORMAL HIGH (ref 4.6–6.5)

## 2018-09-22 ENCOUNTER — Ambulatory Visit (INDEPENDENT_AMBULATORY_CARE_PROVIDER_SITE_OTHER): Payer: Medicare Other | Admitting: Internal Medicine

## 2018-09-22 ENCOUNTER — Other Ambulatory Visit: Payer: Self-pay

## 2018-09-22 VITALS — BP 128/70 | HR 61 | Temp 97.5°F | Resp 16 | Ht 60.0 in | Wt 181.0 lb

## 2018-09-22 DIAGNOSIS — E7800 Pure hypercholesterolemia, unspecified: Secondary | ICD-10-CM

## 2018-09-22 DIAGNOSIS — Z0000 Encounter for general adult medical examination without abnormal findings: Secondary | ICD-10-CM

## 2018-09-22 DIAGNOSIS — I700 Atherosclerosis of aorta: Secondary | ICD-10-CM | POA: Diagnosis not present

## 2018-09-22 DIAGNOSIS — Z8669 Personal history of other diseases of the nervous system and sense organs: Secondary | ICD-10-CM

## 2018-09-22 DIAGNOSIS — I10 Essential (primary) hypertension: Secondary | ICD-10-CM

## 2018-09-22 DIAGNOSIS — Z0001 Encounter for general adult medical examination with abnormal findings: Secondary | ICD-10-CM

## 2018-09-22 DIAGNOSIS — R140 Abdominal distension (gaseous): Secondary | ICD-10-CM

## 2018-09-22 DIAGNOSIS — E119 Type 2 diabetes mellitus without complications: Secondary | ICD-10-CM | POA: Diagnosis not present

## 2018-09-22 DIAGNOSIS — E559 Vitamin D deficiency, unspecified: Secondary | ICD-10-CM

## 2018-09-22 DIAGNOSIS — Z1211 Encounter for screening for malignant neoplasm of colon: Secondary | ICD-10-CM

## 2018-09-22 DIAGNOSIS — E039 Hypothyroidism, unspecified: Secondary | ICD-10-CM

## 2018-09-22 NOTE — Progress Notes (Signed)
Patient ID: Veronica Branch, female   DOB: 09-26-1940, 79 y.o.   MRN: [id number]   Subjective:    Patient ID: Veronica Branch, female    DOB: [birth year], 78 y.o.   MRN: [id number]  HPI  Patient here for her physical exam.  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  Still doing some work from home.  Discussed exercise and diet.  She is trying to walk some.  Trying to watch her diet.  Has lost weight. No chest pain.  No sob.  Does report some acid reflux.  Some bloating.  Off prilosec.  Discussed restarting.  States these are not new symptoms, but has noticed more recently.  Discussed further evaluation, including CT scan.  She declines.  Discussed labs.  a1c 6.9.  Discussed calculated cholesterol risk.  She declines cholesterol medication.  Last colonoscopy 08/2012.  Discussed f/u colonoscopy.  She declines.  Agrees to cologuard.  Discussed immunizations - including shingrix.     Past Medical History:  Diagnosis Date   Diverticulosis    GERD (gastroesophageal reflux disease)    Hiatal hernia    History of migraine headaches    Hypercholesterolemia    Hyperglycemia    Hypertension    Hypothyroidism    s/p removal of right thyroid lobe and isthmus (1992)   Past Surgical History:  Procedure Laterality Date   BREAST EXCISIONAL BIOPSY Right 2000   benign   THYROID LOBECTOMY  1992   s/p removal of right thyroid lobe and isthmus   Family History  Problem Relation Age of Onset   Coronary artery disease Mother        myocardial infarction and CHF   Breast cancer Mother        5's   Breast cancer Maternal Aunt        x 2   Breast cancer Sister    Breast cancer Cousin    Colon cancer Neg Hx    Social History   Socioeconomic History   Marital status: Married    Spouse name: Not on file   Number of children: 2   Years of education: Not on file   Highest education level: Not on file  Occupational History   Occupation: retired Land strain: Not on file   Food insecurity    Worry: Not on file    Inability: Not on file   Transportation needs    Medical: Not on file    Non-medical: Not on file  Tobacco Use   Smoking status: Never Smoker   Smokeless tobacco: Never Used  Substance and Sexual Activity   Alcohol use: No    Alcohol/week: 0.0 standard drinks   Drug use: No   Sexual activity: Yes  Lifestyle   Physical activity    Days per week: Not on file    Minutes per session: Not on file   Stress: Not on file  Relationships   Social connections    Talks on phone: Not on file    Gets together: Not on file    Attends religious service: Not on file    Active member of club or organization: Not on file    Attends meetings of clubs or organizations: Not on file    Relationship status: Not on file  Other Topics Concern   Not on file  Social History Narrative   Regularly exercises. Retired and married.     Outpatient Encounter Medications as  of 09/22/2018  Medication Sig   aspirin 81 MG tablet Take 81 mg by mouth daily.   Blood Glucose Monitoring Suppl (ONE TOUCH ULTRA SYSTEM KIT) W/DEVICE KIT 1 kit by Does not apply route once.   Lancets (ONETOUCH ULTRASOFT) lancets Use to test blood sugars 1-2 times daily   E11.9   lisinopril (PRINIVIL,ZESTRIL) 10 MG tablet TAKE 1 TABLET BY MOUTH EVERY DAY   metFORMIN (GLUCOPHAGE-XR) 500 MG 24 hr tablet TAKE 1 TABLET BY MOUTH EVERY DAY WITH BREAKFAST   omeprazole (PRILOSEC) 20 MG capsule TAKE 1 CAPSULE BY MOUTH EVERY DAY   ONE TOUCH ULTRA TEST test strip TEST BLOOD SUGARS 1 TO 2 TIMES DAILY   polyethylene glycol (MIRALAX / GLYCOLAX) packet Take 17 g by mouth daily. Mix one tablespoon with 8oz of your favorite juice or water every day until you are having soft formed stools. Then start taking once daily if you didn't have a stool the day before.   Probiotic Product (PROBIOTIC PO) Take 1 tablet by mouth daily.   propranolol  (INDERAL) 40 MG tablet TAKE 1 TABLET BY MOUTH TWICE A DAY   SYNTHROID 100 MCG tablet TAKE 1 TABLET BY MOUTH EVERY DAY   No facility-administered encounter medications on file as of 09/22/2018.     Review of Systems  Constitutional: Negative for appetite change and unexpected weight change.  HENT: Negative for congestion and sinus pressure.   Eyes: Negative for pain and visual disturbance.  Respiratory: Negative for cough, chest tightness and shortness of breath.   Cardiovascular: Negative for chest pain, palpitations and leg swelling.  Gastrointestinal: Negative for abdominal pain, diarrhea, nausea and vomiting.       Bloating and acid reflux as outlined.    Genitourinary: Negative for difficulty urinating and dysuria.  Musculoskeletal: Negative for joint swelling and myalgias.  Skin: Negative for color change and rash.  Neurological: Negative for dizziness, light-headedness and headaches.  Hematological: Negative for adenopathy. Does not bruise/bleed easily.  Psychiatric/Behavioral: Negative for agitation and dysphoric mood.       Objective:    Physical Exam Constitutional:      General: She is not in acute distress.    Appearance: Normal appearance. She is well-developed.  HENT:     Right Ear: External ear normal.     Left Ear: External ear normal.  Eyes:     General: No scleral icterus.       Right eye: No discharge.        Left eye: No discharge.     Conjunctiva/sclera: Conjunctivae normal.  Neck:     Musculoskeletal: Neck supple. No muscular tenderness.     Thyroid: No thyromegaly.  Cardiovascular:     Rate and Rhythm: Normal rate and regular rhythm.  Pulmonary:     Effort: No tachypnea, accessory muscle usage or respiratory distress.     Breath sounds: Normal breath sounds. No decreased breath sounds or wheezing.  Chest:     Breasts:        Right: No inverted nipple, mass, nipple discharge or tenderness (no axillary adenopathy).        Left: No inverted nipple,  mass, nipple discharge or tenderness (no axilarry adenopathy).  Abdominal:     General: Bowel sounds are normal.     Palpations: Abdomen is soft.     Tenderness: There is no abdominal tenderness.  Musculoskeletal:        General: No swelling or tenderness.  Lymphadenopathy:     Cervical: No cervical adenopathy.  Skin:    Findings: No erythema or rash.  Neurological:     Mental Status: She is alert and oriented to person, place, and time.  Psychiatric:        Mood and Affect: Mood normal.        Behavior: Behavior normal.     BP 128/70    Pulse 61    Temp (!) 97.5 F (36.4 C)    Resp 16    Ht 5' (1.524 m)    Wt 181 lb (82.1 kg)    SpO2 97%    BMI 35.35 kg/m  Wt Readings from Last 3 Encounters:  09/22/18 181 lb (82.1 kg)  12/07/17 191 lb 6.4 oz (86.8 kg)  07/31/17 193 lb 6.4 oz (87.7 kg)     Lab Results  Component Value Date   WBC 7.2 09/20/2018   HGB 13.8 09/20/2018   HCT 41.4 09/20/2018   PLT 246.0 09/20/2018   GLUCOSE 114 (H) 09/20/2018   CHOL 160 09/20/2018   TRIG 95.0 09/20/2018   HDL 46.10 09/20/2018   LDLDIRECT 156.0 12/06/2012   LDLCALC 95 09/20/2018   ALT 15 09/20/2018   AST 13 09/20/2018   NA 142 09/20/2018   K 3.8 09/20/2018   CL 108 09/20/2018   CREATININE 0.66 09/20/2018   BUN 7 09/20/2018   CO2 27 09/20/2018   TSH 1.47 12/02/2017   HGBA1C 6.9 (H) 09/20/2018   MICROALBUR 1.1 07/29/2017    Mm 3d Screen Breast Bilateral  Result Date: 01/19/2018 CLINICAL DATA:  Screening. EXAM: DIGITAL SCREENING BILATERAL MAMMOGRAM WITH TOMO AND CAD COMPARISON:  Previous exam(s). ACR Breast Density Category b: There are scattered areas of fibroglandular density. FINDINGS: There are no findings suspicious for malignancy. Images were processed with CAD. IMPRESSION: No mammographic evidence of malignancy. A result letter of this screening mammogram will be mailed directly to the patient. RECOMMENDATION: Screening mammogram in one year. (Code:SM-B-01Y) BI-RADS CATEGORY  1:  Negative. Electronically Signed   By: Ammie Ferrier M.D.   On: 01/19/2018 15:47       Assessment & Plan:   Problem List Items Addressed This Visit    Abdominal bloating    Reports persistent intermittent issues with abdominal bloating.  Some acid reflux.  Restart prilosec.  Discussed further w/up and evaluation, including f/u CT scan.  She declines.  Declines colonoscopy.  Agreed to cologuard.  Follow.  Schedule f/u soon to see how she is doing on prilosec.        Aortic atherosclerosis (HCC)    Declines cholesterol medication.        Diabetes ([place])    Low carb diet and exercise.  a1c 6.9.  Has lost weight.  Follow met b and a1c.        Health care maintenance    Physical today 09/22/18.  Mammogram 01/19/18 - Birads I.  Colonoscopy 2014.  Declines f/u colonoscopy.  Agreed to cologuard.  Discussed shingrix.        History of migraine headaches    Stable.       Hypercholesterolemia    Have discussed (and discussed with her today) the recommendation to start cholesterol medication.  Low cholesterol diet and exercise.  She declines medication.  Follow lipid panel.        Hypertension    Blood pressure under good control.  Continue same medication regimen.  Follow pressures.  Follow metabolic panel.        Hypothyroidism    On thyroid replacement.  Follow  tsh.       Vitamin D deficiency    Follow vitamin D level.         Other Visit Diagnoses    Colon cancer screening    -  Primary   Relevant Orders   Cologuard       Einar Pheasant, MD

## 2018-09-27 ENCOUNTER — Encounter: Payer: Self-pay | Admitting: Internal Medicine

## 2018-09-27 NOTE — Assessment & Plan Note (Signed)
Reports persistent intermittent issues with abdominal bloating.  Some acid reflux.  Restart prilosec.  Discussed further w/up and evaluation, including f/u CT scan.  She declines.  Declines colonoscopy.  Agreed to cologuard.  Follow.  Schedule f/u soon to see how she is doing on prilosec.

## 2018-09-27 NOTE — Assessment & Plan Note (Signed)
Follow vitamin D level.  

## 2018-09-27 NOTE — Assessment & Plan Note (Signed)
Low carb diet and exercise.  a1c 6.9.  Has lost weight.  Follow met b and a1c.

## 2018-09-27 NOTE — Assessment & Plan Note (Signed)
Blood pressure under good control.  Continue same medication regimen.  Follow pressures.  Follow metabolic panel.   

## 2018-09-27 NOTE — Assessment & Plan Note (Signed)
On thyroid replacement.  Follow tsh.  

## 2018-09-27 NOTE — Assessment & Plan Note (Signed)
Declines cholesterol medication.   

## 2018-09-27 NOTE — Assessment & Plan Note (Signed)
Stable

## 2018-09-27 NOTE — Assessment & Plan Note (Addendum)
Physical today 09/22/18.  Mammogram 01/19/18 - Birads I.  Colonoscopy 2014.  Declines f/u colonoscopy.  Agreed to cologuard.  Discussed shingrix.

## 2018-09-27 NOTE — Assessment & Plan Note (Signed)
Have discussed (and discussed with her today) the recommendation to start cholesterol medication.  Low cholesterol diet and exercise.  She declines medication.  Follow lipid panel.

## 2018-09-28 ENCOUNTER — Telehealth: Payer: Self-pay

## 2018-09-28 ENCOUNTER — Telehealth: Payer: Self-pay | Admitting: Internal Medicine

## 2018-09-28 ENCOUNTER — Other Ambulatory Visit: Payer: Self-pay

## 2018-09-28 MED ORDER — GLUCOSE BLOOD VI STRP: ORAL_STRIP | 12 refills | Status: DC

## 2018-09-28 MED ORDER — METFORMIN HCL ER 500 MG PO TB24: ORAL_TABLET | ORAL | 1 refills | Status: DC

## 2018-09-28 NOTE — Telephone Encounter (Signed)
Copied from [place] [id number]. Topic: General - Other >> Sep 28, 2018  1:41 PM Carolyn Stare wrote: Pt call to say she has contacted her pharmacy and they do not have the below RX   metFORMIN (GLUCOPHAGE-XR) 500 MG 24 hr tablet sent 09/20/2018 pharmacy don't have     ONE TOUCH ULTRA TEST test strip    she said has no more refills

## 2018-09-28 NOTE — Telephone Encounter (Signed)
Metformin and test strips sent in

## 2018-10-08 ENCOUNTER — Other Ambulatory Visit: Payer: Self-pay | Admitting: Internal Medicine

## 2018-11-11 ENCOUNTER — Other Ambulatory Visit: Payer: Self-pay | Admitting: Internal Medicine

## 2018-11-15 ENCOUNTER — Other Ambulatory Visit: Payer: Self-pay | Admitting: Internal Medicine

## 2018-11-22 ENCOUNTER — Other Ambulatory Visit: Payer: Self-pay

## 2018-11-24 ENCOUNTER — Other Ambulatory Visit: Payer: Self-pay

## 2018-11-24 ENCOUNTER — Ambulatory Visit (INDEPENDENT_AMBULATORY_CARE_PROVIDER_SITE_OTHER): Payer: Medicare Other | Admitting: Internal Medicine

## 2018-11-24 DIAGNOSIS — E039 Hypothyroidism, unspecified: Secondary | ICD-10-CM

## 2018-11-24 DIAGNOSIS — I700 Atherosclerosis of aorta: Secondary | ICD-10-CM

## 2018-11-24 DIAGNOSIS — E1165 Type 2 diabetes mellitus with hyperglycemia: Secondary | ICD-10-CM

## 2018-11-24 DIAGNOSIS — E7800 Pure hypercholesterolemia, unspecified: Secondary | ICD-10-CM

## 2018-11-24 DIAGNOSIS — Z1231 Encounter for screening mammogram for malignant neoplasm of breast: Secondary | ICD-10-CM | POA: Diagnosis not present

## 2018-11-24 DIAGNOSIS — I10 Essential (primary) hypertension: Secondary | ICD-10-CM

## 2018-11-24 DIAGNOSIS — R140 Abdominal distension (gaseous): Secondary | ICD-10-CM

## 2018-11-24 NOTE — Progress Notes (Signed)
Patient ID: Veronica Branch, female   DOB: 1940/08/25, 78 y.o.   MRN: [id number]   Subjective:    Patient ID: Veronica Branch, female    DOB: 1940/10/21, 78 y.o.   MRN: [id number]  HPI  Patient here for a scheduled follow up.  She reports she is doing relatively well.  Discussed diet and exercise.  Discussed last a1c - 6.9. on metformin.  Still having issues with abdominal bloating and discomfort.  prilosec restarted regularly last visit.  Has not made a difference in her symptoms.  No vomiting.  She is eating.  Bowels moving.     Past Medical History:  Diagnosis Date  . Diverticulosis   . GERD (gastroesophageal reflux disease)   . Hiatal hernia   . History of migraine headaches   . Hypercholesterolemia   . Hyperglycemia   . Hypertension   . Hypothyroidism    s/p removal of right thyroid lobe and isthmus (1992)   Past Surgical History:  Procedure Laterality Date  . BREAST EXCISIONAL BIOPSY Right 2000   benign  . THYROID LOBECTOMY  1992   s/p removal of right thyroid lobe and isthmus   Family History  Problem Relation Age of Onset  . Coronary artery disease Mother        myocardial infarction and CHF  . Breast cancer Mother        7's  . Breast cancer Maternal Aunt        x 2  . Breast cancer Sister   . Breast cancer Cousin   . Colon cancer Neg Hx    Social History   Socioeconomic History  . Marital status: Married    Spouse name: Not on file  . Number of children: 2  . Years of education: Not on file  . Highest education level: Not on file  Occupational History  . Occupation: retired Tour manager  . Financial resource strain: Not on file  . Food insecurity    Worry: Not on file    Inability: Not on file  . Transportation needs    Medical: Not on file    Non-medical: Not on file  Tobacco Use  . Smoking status: Never Smoker  . Smokeless tobacco: Never Used  Substance and Sexual Activity  . Alcohol use: No    Alcohol/week: 0.0 standard drinks   . Drug use: No  . Sexual activity: Yes  Lifestyle  . Physical activity    Days per week: Not on file    Minutes per session: Not on file  . Stress: Not on file  Relationships  . Social Herbalist on phone: Not on file    Gets together: Not on file    Attends religious service: Not on file    Active member of club or organization: Not on file    Attends meetings of clubs or organizations: Not on file    Relationship status: Not on file  Other Topics Concern  . Not on file  Social History Narrative   Regularly exercises. Retired and married.     Outpatient Encounter Medications as of 11/24/2018  Medication Sig  . aspirin 81 MG tablet Take 81 mg by mouth daily.  . Blood Glucose Monitoring Suppl (ONE TOUCH ULTRA SYSTEM KIT) W/DEVICE KIT 1 kit by Does not apply route once.  Marland Kitchen glucose blood test strip Use as instructed to check blood sugars twice daily. Dx E 11.9  . Lancets (ONETOUCH ULTRASOFT) lancets Use to  test blood sugars 1-2 times daily   E11.9  . lisinopril (ZESTRIL) 10 MG tablet TAKE 1 TABLET BY MOUTH EVERY DAY  . metFORMIN (GLUCOPHAGE-XR) 500 MG 24 hr tablet TAKE 1 TABLET BY MOUTH EVERY DAY WITH BREAKFAST  . omeprazole (PRILOSEC) 20 MG capsule TAKE 1 CAPSULE BY MOUTH EVERY DAY  . polyethylene glycol (MIRALAX / GLYCOLAX) packet Take 17 g by mouth daily. Mix one tablespoon with 8oz of your favorite juice or water every day until you are having soft formed stools. Then start taking once daily if you didn't have a stool the day before.  . Probiotic Product (PROBIOTIC PO) Take 1 tablet by mouth daily.  . propranolol (INDERAL) 40 MG tablet TAKE 1 TABLET BY MOUTH TWICE A DAY  . SYNTHROID 100 MCG tablet TAKE 1 TABLET BY MOUTH EVERY DAY   No facility-administered encounter medications on file as of 11/24/2018.    Review of Systems  Constitutional: Negative for appetite change and unexpected weight change.  HENT: Negative for congestion and sinus pressure.   Respiratory:  Negative for cough, chest tightness and shortness of breath.   Cardiovascular: Negative for chest pain, palpitations and leg swelling.  Gastrointestinal: Negative for diarrhea, nausea and vomiting.       Abdominal bloating as outlined.   Genitourinary: Negative for difficulty urinating and dysuria.  Musculoskeletal: Negative for joint swelling and myalgias.  Skin: Negative for color change and rash.  Neurological: Negative for dizziness, light-headedness and headaches.  Psychiatric/Behavioral: Negative for agitation and dysphoric mood.       Objective:    Physical Exam Constitutional:      General: She is not in acute distress.    Appearance: Normal appearance.  HENT:     Head: Normocephalic and atraumatic.     Right Ear: External ear normal.     Left Ear: External ear normal.  Eyes:     General: No scleral icterus.       Right eye: No discharge.        Left eye: No discharge.     Conjunctiva/sclera: Conjunctivae normal.  Neck:     Musculoskeletal: Neck supple. No muscular tenderness.     Thyroid: No thyromegaly.  Cardiovascular:     Rate and Rhythm: Normal rate and regular rhythm.  Pulmonary:     Effort: No respiratory distress.     Breath sounds: Normal breath sounds. No wheezing.  Abdominal:     General: Bowel sounds are normal.     Palpations: Abdomen is soft.     Tenderness: There is no abdominal tenderness.  Musculoskeletal:        General: No swelling or tenderness.  Lymphadenopathy:     Cervical: No cervical adenopathy.  Skin:    Findings: No erythema or rash.  Neurological:     Mental Status: She is alert.  Psychiatric:        Mood and Affect: Mood normal.        Behavior: Behavior normal.     BP 114/66   Pulse 65   Temp (!) 97.3 F (36.3 C)   Resp 16   Wt 183 lb (83 kg)   SpO2 97%   BMI 35.74 kg/m  Wt Readings from Last 3 Encounters:  11/24/18 183 lb (83 kg)  09/22/18 181 lb (82.1 kg)  12/07/17 191 lb 6.4 oz (86.8 kg)     Lab Results   Component Value Date   WBC 7.2 09/20/2018   HGB 13.8 09/20/2018   HCT 41.4  09/20/2018   PLT 246.0 09/20/2018   GLUCOSE 114 (H) 09/20/2018   CHOL 160 09/20/2018   TRIG 95.0 09/20/2018   HDL 46.10 09/20/2018   LDLDIRECT 156.0 12/06/2012   LDLCALC 95 09/20/2018   ALT 15 09/20/2018   AST 13 09/20/2018   NA 142 09/20/2018   K 3.8 09/20/2018   CL 108 09/20/2018   CREATININE 0.66 09/20/2018   BUN 7 09/20/2018   CO2 27 09/20/2018   TSH 1.47 12/02/2017   HGBA1C 6.9 (H) 09/20/2018   MICROALBUR 1.1 07/29/2017    Mm 3d Screen Breast Bilateral  Result Date: 01/19/2018 CLINICAL DATA:  Screening. EXAM: DIGITAL SCREENING BILATERAL MAMMOGRAM WITH TOMO AND CAD COMPARISON:  Previous exam(s). ACR Breast Density Category b: There are scattered areas of fibroglandular density. FINDINGS: There are no findings suspicious for malignancy. Images were processed with CAD. IMPRESSION: No mammographic evidence of malignancy. A result letter of this screening mammogram will be mailed directly to the patient. RECOMMENDATION: Screening mammogram in one year. (Code:SM-B-01Y) BI-RADS CATEGORY  1: Negative. Electronically Signed   By: Ammie Ferrier M.D.   On: 01/19/2018 15:47       Assessment & Plan:   Problem List Items Addressed This Visit    Abdominal bloating    Persistent GI issues.  Have discussed following for possible triggers.  Added back prilosec.  Given persistent symptoms, will have GI evaluated.  Pt in agreement.  Due colonoscopy.        Relevant Orders   Ambulatory referral to Gastroenterology   Aortic atherosclerosis ([place])    Declines cholesterol medication.       Diabetes ([place])    Low carb diet and exercise.  On metformin.  Last a1c 09/20/18 - 6.9.  Follow met b and a1c.        Relevant Orders   Hemoglobin L5V   Basic metabolic panel (future)   DM Microalbumin / creatinine urine ratio   Hypercholesterolemia    Have discussed starting statin medication.  She has declined.  Low  cholesterol diet and exercise.  Follow lipid panel.        Relevant Orders   Hepatic function panel   Lipid panel   Hypertension    Blood pressure under good control.  Continue same medication regimen.  Follow pressures.  Follow metabolic panel.        Hypothyroidism    On thyroid replacement.  Follow tsh.        Relevant Orders   TSH    Other Visit Diagnoses    Visit for screening mammogram       Relevant Orders   MM 3D SCREEN BREAST BILATERAL       Einar Pheasant, MD

## 2018-11-28 ENCOUNTER — Encounter: Payer: Self-pay | Admitting: Internal Medicine

## 2018-11-28 NOTE — Assessment & Plan Note (Signed)
Have discussed starting statin medication.  She has declined.  Low cholesterol diet and exercise.  Follow lipid panel.

## 2018-11-28 NOTE — Assessment & Plan Note (Signed)
On thyroid replacement.  Follow tsh.  

## 2018-11-28 NOTE — Assessment & Plan Note (Signed)
Declines cholesterol medication.   

## 2018-11-28 NOTE — Assessment & Plan Note (Signed)
Low carb diet and exercise.  On metformin.  Last a1c 09/20/18 - 6.9.  Follow met b and a1c.

## 2018-11-28 NOTE — Assessment & Plan Note (Signed)
Blood pressure under good control.  Continue same medication regimen.  Follow pressures.  Follow metabolic panel.   

## 2018-11-28 NOTE — Assessment & Plan Note (Signed)
Persistent GI issues.  Have discussed following for possible triggers.  Added back prilosec.  Given persistent symptoms, will have GI evaluated.  Pt in agreement.  Due colonoscopy.

## 2018-12-01 ENCOUNTER — Encounter: Payer: Self-pay | Admitting: Internal Medicine

## 2019-02-08 ENCOUNTER — Other Ambulatory Visit: Payer: Medicare Other

## 2019-02-09 ENCOUNTER — Ambulatory Visit: Payer: Medicare Other | Admitting: Internal Medicine

## 2019-02-16 ENCOUNTER — Other Ambulatory Visit: Payer: Self-pay

## 2019-02-22 ENCOUNTER — Other Ambulatory Visit (INDEPENDENT_AMBULATORY_CARE_PROVIDER_SITE_OTHER): Payer: Medicare PPO

## 2019-02-22 ENCOUNTER — Other Ambulatory Visit: Payer: Self-pay

## 2019-02-22 DIAGNOSIS — E039 Hypothyroidism, unspecified: Secondary | ICD-10-CM | POA: Diagnosis not present

## 2019-02-22 DIAGNOSIS — E7800 Pure hypercholesterolemia, unspecified: Secondary | ICD-10-CM | POA: Diagnosis not present

## 2019-02-22 DIAGNOSIS — E1165 Type 2 diabetes mellitus with hyperglycemia: Secondary | ICD-10-CM | POA: Diagnosis not present

## 2019-02-22 LAB — BASIC METABOLIC PANEL
BUN: 9 mg/dL (ref 6–23)
CO2: 29 mEq/L (ref 19–32)
Calcium: 9.4 mg/dL (ref 8.4–10.5)
Chloride: 104 mEq/L (ref 96–112)
Creatinine, Ser: 0.72 mg/dL (ref 0.40–1.20)
GFR: 78.48 mL/min (ref >60.00)
Glucose, Bld: 145 mg/dL — ABNORMAL HIGH (ref 70–99)
Potassium: 3.9 mEq/L (ref 3.5–5.1)
Sodium: 140 mEq/L (ref 135–145)

## 2019-02-22 LAB — LIPID PANEL
Cholesterol: 182 mg/dL (ref 0–200)
HDL: 48.8 mg/dL (ref >39.00)
LDL Cholesterol: 111 mg/dL — ABNORMAL HIGH (ref 0–99)
NonHDL: 132.73
Total CHOL/HDL Ratio: 4
Triglycerides: 111 mg/dL (ref 0.0–149.0)
VLDL: 22.2 mg/dL (ref 0.0–40.0)

## 2019-02-22 LAB — MICROALBUMIN / CREATININE URINE RATIO
Creatinine,U: 162.1 mg/dL
Microalb Creat Ratio: 1.4 mg/g (ref 0.0–30.0)
Microalb, Ur: 2.3 mg/dL — ABNORMAL HIGH (ref 0.0–1.9)

## 2019-02-22 LAB — HEPATIC FUNCTION PANEL
ALT: 16 U/L (ref 0–35)
AST: 13 U/L (ref 0–37)
Albumin: 4.2 g/dL (ref 3.5–5.2)
Alkaline Phosphatase: 76 U/L (ref 39–117)
Bilirubin, Direct: 0.1 mg/dL (ref 0.0–0.3)
Total Bilirubin: 0.5 mg/dL (ref 0.2–1.2)
Total Protein: 7.3 g/dL (ref 6.0–8.3)

## 2019-02-22 LAB — HEMOGLOBIN A1C: Hgb A1c MFr Bld: 7.3 % — ABNORMAL HIGH (ref 4.6–6.5)

## 2019-02-22 LAB — TSH: TSH: 0.36 u[IU]/mL (ref 0.35–4.50)

## 2019-02-25 ENCOUNTER — Ambulatory Visit: Payer: Medicare Other | Admitting: Internal Medicine

## 2019-02-28 ENCOUNTER — Encounter: Payer: Self-pay | Admitting: Internal Medicine

## 2019-02-28 ENCOUNTER — Ambulatory Visit (INDEPENDENT_AMBULATORY_CARE_PROVIDER_SITE_OTHER): Payer: Medicare PPO | Admitting: Internal Medicine

## 2019-02-28 VITALS — Ht 60.0 in | Wt 183.0 lb

## 2019-02-28 DIAGNOSIS — E039 Hypothyroidism, unspecified: Secondary | ICD-10-CM | POA: Diagnosis not present

## 2019-02-28 DIAGNOSIS — I700 Atherosclerosis of aorta: Secondary | ICD-10-CM

## 2019-02-28 DIAGNOSIS — R140 Abdominal distension (gaseous): Secondary | ICD-10-CM

## 2019-02-28 DIAGNOSIS — E1165 Type 2 diabetes mellitus with hyperglycemia: Secondary | ICD-10-CM | POA: Diagnosis not present

## 2019-02-28 DIAGNOSIS — E7800 Pure hypercholesterolemia, unspecified: Secondary | ICD-10-CM | POA: Diagnosis not present

## 2019-02-28 DIAGNOSIS — I10 Essential (primary) hypertension: Secondary | ICD-10-CM

## 2019-02-28 NOTE — Progress Notes (Signed)
Patient ID: Veronica Branch, female   DOB: Oct 14, 1940, 79 y.o.   MRN: [id number]   Virtual Visit via telephone Note  This visit type was conducted due to national recommendations for restrictions regarding the COVID-19 pandemic (e.g. social distancing).  This format is felt to be most appropriate for this patient at this time.  All issues noted in this document were discussed and addressed.  No physical exam was performed (except for noted visual exam findings with Video Visits).   I connected with Veronica Branch by telephone and verified that I am speaking with the correct person using two identifiers. Location patient: home Location provider: work  Persons participating in the telephone visit: patient, provider  I discussed the limitations, risks, security and privacy concerns of performing an evaluation and management service by telephone and the availability of in person appointments. The patient expressed understanding and agreed to proceed.   Reason for visit: scheduled follow up.   HPI: She reports she is doing relatively well.  Not exercising regularly.  No chest pain. No sob.  Acid reflux and bloating controlled on prilosec - 2-3x/week.  Has adjusted some carbs.  Eats largest meal in the evening and eats a snack before bed. On metformin q day.  Reports loose stool, but reports this is only intermittent.  Not sure related to metformin.  Discussed a1c - increased - 7.3.  Discussed increasing to bid metformin.  She declined.  Wants to work on diet and exercise before adding medication.  Discussed cholesterol and calculated cholesterol risk. She is agreeable to start cholesterol medication.  Will start 2x/week.  No chest pain.  Breathing stable.  No abdominal pain.  Has postponed her colonoscopy and mammogram and wants to wait - due to covid.      ROS: See pertinent positives and negatives per HPI.  Past Medical History:  Diagnosis Date  . Diverticulosis   . GERD (gastroesophageal  reflux disease)   . Hiatal hernia   . History of migraine headaches   . Hypercholesterolemia   . Hyperglycemia   . Hypertension   . Hypothyroidism    s/p removal of right thyroid lobe and isthmus (1992)    Past Surgical History:  Procedure Laterality Date  . BREAST EXCISIONAL BIOPSY Right 2000   benign  . THYROID LOBECTOMY  1992   s/p removal of right thyroid lobe and isthmus    Family History  Problem Relation Age of Onset  . Coronary artery disease Mother        myocardial infarction and CHF  . Breast cancer Mother        49's  . Breast cancer Maternal Aunt        x 2  . Breast cancer Sister   . Breast cancer Cousin   . Colon cancer Neg Hx     SOCIAL HX: reviewed    Current Outpatient Medications:  .  aspirin 81 MG tablet, Take 81 mg by mouth daily., Disp: , Rfl:  .  Blood Glucose Monitoring Suppl (ONE TOUCH ULTRA SYSTEM KIT) W/DEVICE KIT, 1 kit by Does not apply route once., Disp: 1 each, Rfl: 0 .  glucose blood test strip, Use as instructed to check blood sugars twice daily. Dx E 11.9, Disp: 100 each, Rfl: 12 .  Lancets (ONETOUCH ULTRASOFT) lancets, Use to test blood sugars 1-2 times daily   E11.9, Disp: 100 each, Rfl: 12 .  lisinopril (ZESTRIL) 10 MG tablet, TAKE 1 TABLET BY MOUTH EVERY DAY, Disp: 90 tablet,  Rfl: 3 .  metFORMIN (GLUCOPHAGE-XR) 500 MG 24 hr tablet, TAKE 1 TABLET BY MOUTH EVERY DAY WITH BREAKFAST, Disp: 90 tablet, Rfl: 1 .  omeprazole (PRILOSEC) 20 MG capsule, TAKE 1 CAPSULE BY MOUTH EVERY DAY, Disp: 90 capsule, Rfl: 0 .  polyethylene glycol (MIRALAX / GLYCOLAX) packet, Take 17 g by mouth daily. Mix one tablespoon with 8oz of your favorite juice or water every day until you are having soft formed stools. Then start taking once daily if you didn't have a stool the day before., Disp: 30 each, Rfl: 0 .  Probiotic Product (PROBIOTIC PO), Take 1 tablet by mouth daily., Disp: , Rfl:  .  propranolol (INDERAL) 40 MG tablet, TAKE 1 TABLET BY MOUTH TWICE A DAY,  Disp: 180 tablet, Rfl: 3 .  rosuvastatin (CRESTOR) 10 MG tablet, Take one tablet 2x/week., Disp: 30 tablet, Rfl: 1 .  SYNTHROID 100 MCG tablet, TAKE 1 TABLET BY MOUTH EVERY DAY, Disp: 90 tablet, Rfl: 3  EXAM:  GENERAL: alert.  Sounds to be in no acute distress.  Answering questions appropriately.   PSYCH/NEURO: pleasant and cooperative, no obvious depression or anxiety, speech and thought processing grossly intact  ASSESSMENT AND PLAN:  Discussed the following assessment and plan:  Aortic atherosclerosis ([place]) Start crestor.    Abdominal bloating Acid reflux and bloating controlled on prilosec.  Overdue colonoscopy.  Wants to hold due to covid.  Will notify when agreeable.    Diabetes a1c elevated as outlined.  Discussed increasing metformin to bid.  She wants to hold on adjusting medication.  Low carb diet and exercise.  Wants to work on diet and exercise.  Follow met b and a1c.   Hypercholesterolemia Low cholesterol diet and exercise.  Discussed calculated cholesterol risk.  She is agreeable to start crestor - low dose  - a couple of days per week.  Follow lipid panel and liver function tests.    Hypertension Blood pressure under good control.  Continue same medication regimen.  Follow pressures.  Follow metabolic panel.    Hypothyroidism On thyroid replacement.  Follow tsh.    Orders Placed This Encounter  Procedures  . Hepatic function panel    Standing Status:   Future    Standing Expiration Date:   02/28/2020    Meds ordered this encounter  Medications  . rosuvastatin (CRESTOR) 10 MG tablet    Sig: Take one tablet 2x/week.    Dispense:  30 tablet    Refill:  1     I discussed the assessment and treatment plan with the patient. The patient was provided an opportunity to ask questions and all were answered. The patient agreed with the plan and demonstrated an understanding of the instructions.   The patient was advised to call back or seek an in-person evaluation  if the symptoms worsen or if the condition fails to improve as anticipated.  I provided 30 minutes of non-face-to-face time during this encounter.   Einar Pheasant, MD

## 2019-03-06 ENCOUNTER — Encounter: Payer: Self-pay | Admitting: Internal Medicine

## 2019-03-06 MED ORDER — ROSUVASTATIN CALCIUM 10 MG PO TABS: ORAL_TABLET | ORAL | 1 refills | Status: DC

## 2019-03-06 NOTE — Assessment & Plan Note (Signed)
a1c elevated as outlined.  Discussed increasing metformin to bid.  She wants to hold on adjusting medication.  Low carb diet and exercise.  Wants to work on diet and exercise.  Follow met b and a1c.

## 2019-03-06 NOTE — Assessment & Plan Note (Signed)
Start crestor 

## 2019-03-06 NOTE — Assessment & Plan Note (Signed)
Low cholesterol diet and exercise.  Discussed calculated cholesterol risk.  She is agreeable to start crestor - low dose  - a couple of days per week.  Follow lipid panel and liver function tests.

## 2019-03-06 NOTE — Assessment & Plan Note (Signed)
Acid reflux and bloating controlled on prilosec.  Overdue colonoscopy.  Wants to hold due to covid.  Will notify when agreeable.

## 2019-03-06 NOTE — Assessment & Plan Note (Signed)
On thyroid replacement.  Follow tsh.  

## 2019-03-06 NOTE — Assessment & Plan Note (Signed)
Blood pressure under good control.  Continue same medication regimen.  Follow pressures.  Follow metabolic panel.   

## 2019-03-24 ENCOUNTER — Other Ambulatory Visit: Payer: Self-pay | Admitting: Internal Medicine

## 2019-03-30 ENCOUNTER — Telehealth: Payer: Self-pay | Admitting: Internal Medicine

## 2019-03-30 ENCOUNTER — Ambulatory Visit: Payer: Medicare PPO

## 2019-03-30 NOTE — Telephone Encounter (Signed)
Patient's insurance has changed to [facility]. [employer] does not cover OneTouch stripes. Patient is requesting a new prescription of monitor with stirpes, please make sure [employer] covers monitor.

## 2019-03-31 NOTE — Telephone Encounter (Signed)
Left detailed message letting pt know that she will need to call her insurance company

## 2019-04-04 ENCOUNTER — Other Ambulatory Visit: Payer: Self-pay | Admitting: Internal Medicine

## 2019-04-06 ENCOUNTER — Other Ambulatory Visit: Payer: Self-pay

## 2019-04-06 MED ORDER — BLOOD GLUCOSE MONITOR KIT: PACK | 0 refills | Status: AC

## 2019-04-06 NOTE — Telephone Encounter (Signed)
Rx printed for signature. 

## 2019-04-06 NOTE — Telephone Encounter (Signed)
Patient needs a prescription sent to the pharmacy for her new  Accu-Chek guide meter    Accu- Chek guide test strips    Accu-Chek fastClix lancets.

## 2019-04-13 ENCOUNTER — Other Ambulatory Visit (INDEPENDENT_AMBULATORY_CARE_PROVIDER_SITE_OTHER): Payer: Medicare PPO

## 2019-04-13 ENCOUNTER — Other Ambulatory Visit: Payer: Self-pay

## 2019-04-13 DIAGNOSIS — E7800 Pure hypercholesterolemia, unspecified: Secondary | ICD-10-CM | POA: Diagnosis not present

## 2019-04-13 LAB — HEPATIC FUNCTION PANEL
ALT: 16 U/L (ref 0–35)
AST: 14 U/L (ref 0–37)
Albumin: 4.1 g/dL (ref 3.5–5.2)
Alkaline Phosphatase: 77 U/L (ref 39–117)
Bilirubin, Direct: 0.1 mg/dL (ref 0.0–0.3)
Total Bilirubin: 0.5 mg/dL (ref 0.2–1.2)
Total Protein: 7 g/dL (ref 6.0–8.3)

## 2019-05-03 ENCOUNTER — Ambulatory Visit
Admission: RE | Admit: 2019-05-03 | Discharge: 2019-05-03 | Disposition: A | Discharge status: Home / Self Care | Payer: Medicare PPO | Source: Ambulatory Visit | Attending: Internal Medicine | Admitting: Internal Medicine

## 2019-05-03 DIAGNOSIS — Z1231 Encounter for screening mammogram for malignant neoplasm of breast: Secondary | ICD-10-CM | POA: Insufficient documentation

## 2019-05-11 ENCOUNTER — Telehealth: Payer: Self-pay | Admitting: Internal Medicine

## 2019-05-11 NOTE — Telephone Encounter (Signed)
Pt has a rash in her vaginal area she said that she has had this before and wanted Dr. Lorin Picket to call her in something in.. She didn't want to make an appt

## 2019-05-12 MED ORDER — NYSTATIN 100000 UNIT/GM EX CREA: 1.0000 "application " | TOPICAL_CREAM | Freq: Two times a day (BID) | CUTANEOUS | 0 refills | Status: DC

## 2019-05-12 NOTE — Addendum Note (Signed)
Addended by: Charm Barges on: 05/12/2019 01:11 PM   Modules accepted: Orders

## 2019-05-12 NOTE — Telephone Encounter (Signed)
Called patient to clarify. She stated she is raw in her groin area. Has been like this for about a week. She thinks it is coming from her pads. Confirmed no spreading rash, no open sores, bleeding, etc. No urinary symptoms. Tried using a&d ointment but did not help. Pt has had nystatin cream in the past and would like to have some sent in to use in the groin area until cleared. Are you ok with me sending in for her. Pharmacy is [facility]

## 2019-05-12 NOTE — Telephone Encounter (Signed)
rx for nystatin cream to appy bid - sent in to [facility].  (is this [facility]?).  If so, notify pt.  Also,  Let us know if persistent problems.

## 2019-05-12 NOTE — Telephone Encounter (Signed)
Patient called office to see what she can do for her rash. See note below. Dr. Lorin Picket gave her years ago some type of cream and it helped and she would like a refill on that cream. Patient did not know the name.

## 2019-05-12 NOTE — Telephone Encounter (Signed)
Patient aware and this is correct pharmacy.

## 2019-05-23 DIAGNOSIS — E113393 Type 2 diabetes mellitus with moderate nonproliferative diabetic retinopathy without macular edema, bilateral: Secondary | ICD-10-CM | POA: Diagnosis not present

## 2019-06-13 ENCOUNTER — Telehealth: Payer: Self-pay | Admitting: Internal Medicine

## 2019-06-13 NOTE — Telephone Encounter (Signed)
Left message for patient to call back and schedule Medicare Annual Wellness Visit (AWV) either virtually or audio only.  Last AWV 02/11/16; please schedule at anytime with Denisa O'Brien-Blaney at [facility].

## 2019-06-13 NOTE — Telephone Encounter (Signed)
Pt called she declined wellness exam.

## 2019-06-28 ENCOUNTER — Other Ambulatory Visit: Payer: Self-pay | Admitting: Internal Medicine

## 2019-07-04 ENCOUNTER — Other Ambulatory Visit: Payer: Self-pay | Admitting: Internal Medicine

## 2019-07-14 ENCOUNTER — Other Ambulatory Visit: Payer: Self-pay

## 2019-07-14 ENCOUNTER — Ambulatory Visit (INDEPENDENT_AMBULATORY_CARE_PROVIDER_SITE_OTHER): Payer: Medicare PPO

## 2019-07-14 ENCOUNTER — Ambulatory Visit: Payer: Medicare PPO | Admitting: Internal Medicine

## 2019-07-14 ENCOUNTER — Encounter: Payer: Self-pay | Admitting: Internal Medicine

## 2019-07-14 DIAGNOSIS — E039 Hypothyroidism, unspecified: Secondary | ICD-10-CM

## 2019-07-14 DIAGNOSIS — E1165 Type 2 diabetes mellitus with hyperglycemia: Secondary | ICD-10-CM | POA: Diagnosis not present

## 2019-07-14 DIAGNOSIS — I700 Atherosclerosis of aorta: Secondary | ICD-10-CM | POA: Diagnosis not present

## 2019-07-14 DIAGNOSIS — I10 Essential (primary) hypertension: Secondary | ICD-10-CM

## 2019-07-14 DIAGNOSIS — M25552 Pain in left hip: Secondary | ICD-10-CM

## 2019-07-14 DIAGNOSIS — E7800 Pure hypercholesterolemia, unspecified: Secondary | ICD-10-CM | POA: Diagnosis not present

## 2019-07-14 DIAGNOSIS — E559 Vitamin D deficiency, unspecified: Secondary | ICD-10-CM

## 2019-07-14 NOTE — Progress Notes (Signed)
Patient ID: Veronica Branch, female   DOB: May 05, 1940, 79 y.o.   MRN: [id number]   Subjective:    Patient ID: Veronica Branch, female    DOB: May 16, 1940, 79 y.o.   MRN: [id number]  HPI This visit occurred during the SARS-CoV-2 public health emergency.  Safety protocols were in place, including screening questions prior to the visit, additional usage of staff PPE, and extensive cleaning of exam room while observing appropriate contact time as indicated for disinfecting solutions.  Patient here for a scheduled follow up.  She reports she is doing relatively well.  Has noticed some persistent discomfort in her left groin. Notices when going up stairs or getting into a high car.  Left groin and left knee pain.  Has adjusted her diet. Has cut out bread.   Trying to be more physically active.  No chest pain or sob.  No acid reflux or abdominal pain reported.  Bowels moving.    Past Medical History:  Diagnosis Date  . Diverticulosis   . GERD (gastroesophageal reflux disease)   . Hiatal hernia   . History of migraine headaches   . Hypercholesterolemia   . Hyperglycemia   . Hypertension   . Hypothyroidism    s/p removal of right thyroid lobe and isthmus (1992)   Past Surgical History:  Procedure Laterality Date  . BREAST EXCISIONAL BIOPSY Right 2000   benign  . THYROID LOBECTOMY  1992   s/p removal of right thyroid lobe and isthmus   Family History  Problem Relation Age of Onset  . Coronary artery disease Mother        myocardial infarction and CHF  . Breast cancer Mother        30's  . Breast cancer Maternal Aunt        x 2  . Breast cancer Sister        61's  . Breast cancer Cousin        maternal side  . Breast cancer Other 51  . Colon cancer Neg Hx    Social History   Socioeconomic History  . Marital status: Married    Spouse name: Not on file  . Number of children: 2  . Years of education: Not on file  . Highest education level: Not on file  Occupational History  .  Occupation: retired Pharmacist, hospital  Tobacco Use  . Smoking status: Never Smoker  . Smokeless tobacco: Never Used  Substance and Sexual Activity  . Alcohol use: No    Alcohol/week: 0.0 standard drinks  . Drug use: No  . Sexual activity: Yes  Other Topics Concern  . Not on file  Social History Narrative   Regularly exercises. Retired and married.    Social Determinants of Health   Financial Resource Strain:   . Difficulty of Paying Living Expenses:   Food Insecurity:   . Worried About Charity fundraiser in the Last Year:   . Arboriculturist in the Last Year:   Transportation Needs:   . Film/video editor (Medical):   Marland Kitchen Lack of Transportation (Non-Medical):   Physical Activity:   . Days of Exercise per Week:   . Minutes of Exercise per Session:   Stress:   . Feeling of Stress :   Social Connections:   . Frequency of Communication with Friends and Family:   . Frequency of Social Gatherings with Friends and Family:   . Attends Religious Services:   . Active Member of Clubs or  Organizations:   . Attends Archivist Meetings:   Marland Kitchen Marital Status:     Outpatient Encounter Medications as of 07/14/2019  Medication Sig  . ACCU-CHEK GUIDE test strip USE TO TEST BLOOD SUGAR ONCE DAILY  . aspirin 81 MG tablet Take 81 mg by mouth daily.  . blood glucose meter kit and supplies KIT Dispense based on patient and insurance preference. Use to check sugars once daily. Dx E11.9  . Lancets (ONETOUCH ULTRASOFT) lancets Use to test blood sugars 1-2 times daily   E11.9  . lisinopril (ZESTRIL) 10 MG tablet TAKE 1 TABLET BY MOUTH EVERY DAY  . metFORMIN (GLUCOPHAGE-XR) 500 MG 24 hr tablet TAKE 1 TABLET BY MOUTH EVERY DAY WITH BREAKFAST  . nystatin cream (MYCOSTATIN) Apply 1 application topically 2 (two) times daily.  Marland Kitchen omeprazole (PRILOSEC) 20 MG capsule TAKE 1 CAPSULE BY MOUTH EVERY DAY  . polyethylene glycol (MIRALAX / GLYCOLAX) packet Take 17 g by mouth daily. Mix one tablespoon with 8oz  of your favorite juice or water every day until you are having soft formed stools. Then start taking once daily if you didn't have a stool the day before.  . Probiotic Product (PROBIOTIC PO) Take 1 tablet by mouth daily.  . propranolol (INDERAL) 40 MG tablet TAKE 1 TABLET BY MOUTH TWICE A DAY  . rosuvastatin (CRESTOR) 10 MG tablet Take one tablet 2x/week.  Marland Kitchen SYNTHROID 100 MCG tablet TAKE 1 TABLET BY MOUTH EVERY DAY  . [DISCONTINUED] Blood Glucose Monitoring Suppl (ONE TOUCH ULTRA SYSTEM KIT) W/DEVICE KIT 1 kit by Does not apply route once.   No facility-administered encounter medications on file as of 07/14/2019.    Review of Systems  Constitutional: Negative for appetite change and unexpected weight change.  HENT: Negative for congestion and sinus pressure.   Respiratory: Negative for cough, chest tightness and shortness of breath.   Cardiovascular: Negative for chest pain, palpitations and leg swelling.  Gastrointestinal: Negative for abdominal pain, diarrhea, nausea and vomiting.  Genitourinary: Negative for difficulty urinating and dysuria.  Musculoskeletal: Negative for joint swelling and myalgias.       Left groin and knee pain.    Skin: Negative for color change and rash.  Neurological: Negative for dizziness, light-headedness and headaches.  Psychiatric/Behavioral: Negative for agitation and dysphoric mood.       Objective:    Physical Exam Vitals reviewed.  Constitutional:      General: She is not in acute distress.    Appearance: Normal appearance.  HENT:     Head: Normocephalic and atraumatic.     Right Ear: External ear normal.     Left Ear: External ear normal.  Eyes:     General: No scleral icterus.       Right eye: No discharge.        Left eye: No discharge.     Conjunctiva/sclera: Conjunctivae normal.  Neck:     Thyroid: No thyromegaly.  Cardiovascular:     Rate and Rhythm: Normal rate and regular rhythm.  Pulmonary:     Effort: No respiratory distress.      Breath sounds: Normal breath sounds. No wheezing.  Abdominal:     General: Bowel sounds are normal.     Palpations: Abdomen is soft.     Tenderness: There is no abdominal tenderness.  Musculoskeletal:        General: No swelling or tenderness.     Cervical back: Neck supple. No tenderness.     Comments: Some pain  with abduction/adduction left leg.   Lymphadenopathy:     Cervical: No cervical adenopathy.  Skin:    Findings: No erythema or rash.  Neurological:     Mental Status: She is alert.  Psychiatric:        Mood and Affect: Mood normal.        Behavior: Behavior normal.     BP 118/62   Pulse 62   Temp (!) 97 F (36.1 C)   Resp 16   Ht 5' (1.524 m)   Wt 177 lb (80.3 kg)   SpO2 98%   BMI 34.57 kg/m  Wt Readings from Last 3 Encounters:  07/14/19 177 lb (80.3 kg)  02/28/19 183 lb (83 kg)  11/24/18 183 lb (83 kg)     Lab Results  Component Value Date   WBC 7.2 09/20/2018   HGB 13.8 09/20/2018   HCT 41.4 09/20/2018   PLT 246.0 09/20/2018   GLUCOSE 132 (H) 07/21/2019   CHOL 163 07/21/2019   TRIG 90.0 07/21/2019   HDL 47.60 07/21/2019   LDLDIRECT 156.0 12/06/2012   LDLCALC 97 07/21/2019   ALT 15 07/21/2019   AST 10 07/21/2019   NA 142 07/21/2019   K 4.2 07/21/2019   CL 107 07/21/2019   CREATININE 0.69 07/21/2019   BUN 8 07/21/2019   CO2 29 07/21/2019   TSH 0.36 02/22/2019   HGBA1C 6.9 (H) 07/21/2019   MICROALBUR 2.3 (H) 02/22/2019    MM 3D SCREEN BREAST BILATERAL  Result Date: 05/03/2019 CLINICAL DATA:  Screening. EXAM: DIGITAL SCREENING BILATERAL MAMMOGRAM WITH TOMO AND CAD COMPARISON:  Previous exam(s). ACR Breast Density Category b: There are scattered areas of fibroglandular density. FINDINGS: There are no findings suspicious for malignancy. Images were processed with CAD. IMPRESSION: No mammographic evidence of malignancy. A result letter of this screening mammogram will be mailed directly to the patient. RECOMMENDATION: Screening mammogram in  one year. (Code:SM-B-01Y) BI-RADS CATEGORY  1: Negative. Electronically Signed   By: Audie Pinto M.D.   On: 05/03/2019 15:36       Assessment & Plan:   Problem List Items Addressed This Visit    Aortic atherosclerosis ([place])    On crestor.        Diabetes ([place])    Low carb diet and exercise.  Has adjusted her diet.  Low weight.  Follow met b and a1c.        Relevant Orders   Hemoglobin T4H   Basic metabolic panel   Hypercholesterolemia    On crestor.  Low cholesterol diet and exercise.  Follow lipid panel and liver function tests.        Relevant Orders   Hepatic function panel   Lipid panel   Hypertension    Blood pressure as outlined.  Continue lisinopril and propranolol.  Follow pressures.  Follow metabolic panel.        Relevant Orders   CBC with Differential/Platelet   Hypothyroidism    On thyroid replacement.  Follow tsh.        Left hip pain    Left groin pain as outlined.  Check xray.  Further w/up pending results.        Relevant Orders   DG HIP UNILAT WITH PELVIS 2-3 VIEWS LEFT (Completed)   Vitamin D deficiency    Follow vitamin d level.            Einar Pheasant, MD

## 2019-07-19 ENCOUNTER — Telehealth: Payer: Self-pay | Admitting: *Deleted

## 2019-07-19 NOTE — Telephone Encounter (Signed)
Please place future orders for lab appt.  

## 2019-07-20 ENCOUNTER — Other Ambulatory Visit: Payer: Self-pay | Admitting: Internal Medicine

## 2019-07-20 DIAGNOSIS — E1165 Type 2 diabetes mellitus with hyperglycemia: Secondary | ICD-10-CM

## 2019-07-20 DIAGNOSIS — I10 Essential (primary) hypertension: Secondary | ICD-10-CM

## 2019-07-20 DIAGNOSIS — E7800 Pure hypercholesterolemia, unspecified: Secondary | ICD-10-CM

## 2019-07-20 NOTE — Progress Notes (Signed)
Order placed for labs.

## 2019-07-20 NOTE — Telephone Encounter (Signed)
Order placed for labs.

## 2019-07-21 ENCOUNTER — Other Ambulatory Visit (INDEPENDENT_AMBULATORY_CARE_PROVIDER_SITE_OTHER): Payer: Medicare PPO

## 2019-07-21 ENCOUNTER — Other Ambulatory Visit: Payer: Self-pay

## 2019-07-21 DIAGNOSIS — E7800 Pure hypercholesterolemia, unspecified: Secondary | ICD-10-CM

## 2019-07-21 DIAGNOSIS — E1165 Type 2 diabetes mellitus with hyperglycemia: Secondary | ICD-10-CM | POA: Diagnosis not present

## 2019-07-21 DIAGNOSIS — I10 Essential (primary) hypertension: Secondary | ICD-10-CM

## 2019-07-21 DIAGNOSIS — R300 Dysuria: Secondary | ICD-10-CM

## 2019-07-21 LAB — LIPID PANEL
Cholesterol: 163 mg/dL (ref 0–200)
HDL: 47.6 mg/dL (ref >39.00)
LDL Cholesterol: 97 mg/dL (ref 0–99)
NonHDL: 115.32
Total CHOL/HDL Ratio: 3
Triglycerides: 90 mg/dL (ref 0.0–149.0)
VLDL: 18 mg/dL (ref 0.0–40.0)

## 2019-07-21 LAB — BASIC METABOLIC PANEL
BUN: 8 mg/dL (ref 6–23)
CO2: 29 mEq/L (ref 19–32)
Calcium: 9.2 mg/dL (ref 8.4–10.5)
Chloride: 107 mEq/L (ref 96–112)
Creatinine, Ser: 0.69 mg/dL (ref 0.40–1.20)
GFR: 82.34 mL/min (ref >60.00)
Glucose, Bld: 132 mg/dL — ABNORMAL HIGH (ref 70–99)
Potassium: 4.2 mEq/L (ref 3.5–5.1)
Sodium: 142 mEq/L (ref 135–145)

## 2019-07-21 LAB — HEPATIC FUNCTION PANEL
ALT: 15 U/L (ref 0–35)
AST: 10 U/L (ref 0–37)
Albumin: 4.1 g/dL (ref 3.5–5.2)
Alkaline Phosphatase: 75 U/L (ref 39–117)
Bilirubin, Direct: 0.1 mg/dL (ref 0.0–0.3)
Total Bilirubin: 0.5 mg/dL (ref 0.2–1.2)
Total Protein: 6.5 g/dL (ref 6.0–8.3)

## 2019-07-21 LAB — HEMOGLOBIN A1C: Hgb A1c MFr Bld: 6.9 % — ABNORMAL HIGH (ref 4.6–6.5)

## 2019-07-22 ENCOUNTER — Telehealth: Payer: Self-pay

## 2019-07-22 NOTE — Telephone Encounter (Signed)
LMTCB regarding labs 

## 2019-07-25 ENCOUNTER — Encounter: Payer: Self-pay | Admitting: Internal Medicine

## 2019-07-25 NOTE — Assessment & Plan Note (Signed)
Left groin pain as outlined.  Check xray.  Further w/up pending results.

## 2019-07-25 NOTE — Assessment & Plan Note (Signed)
Blood pressure as outlined.  Continue lisinopril and propranolol.  Follow pressures.  Follow metabolic panel.

## 2019-07-25 NOTE — Assessment & Plan Note (Signed)
Low carb diet and exercise.  Has adjusted her diet.  Low weight.  Follow met b and a1c.

## 2019-07-25 NOTE — Assessment & Plan Note (Signed)
On crestor.  Low cholesterol diet and exercise.  Follow lipid panel and liver function tests.   

## 2019-07-25 NOTE — Assessment & Plan Note (Signed)
On thyroid replacement.  Follow tsh.  

## 2019-07-25 NOTE — Assessment & Plan Note (Signed)
On crestor.   

## 2019-07-25 NOTE — Assessment & Plan Note (Signed)
Follow vitamin d level.   

## 2019-07-26 ENCOUNTER — Other Ambulatory Visit: Payer: Self-pay

## 2019-07-26 ENCOUNTER — Telehealth (INDEPENDENT_AMBULATORY_CARE_PROVIDER_SITE_OTHER): Payer: Medicare PPO | Admitting: Internal Medicine

## 2019-07-26 ENCOUNTER — Other Ambulatory Visit (INDEPENDENT_AMBULATORY_CARE_PROVIDER_SITE_OTHER): Payer: Medicare PPO

## 2019-07-26 DIAGNOSIS — R3915 Urgency of urination: Secondary | ICD-10-CM

## 2019-07-26 DIAGNOSIS — E7800 Pure hypercholesterolemia, unspecified: Secondary | ICD-10-CM

## 2019-07-26 DIAGNOSIS — R300 Dysuria: Secondary | ICD-10-CM

## 2019-07-26 DIAGNOSIS — I10 Essential (primary) hypertension: Secondary | ICD-10-CM

## 2019-07-26 DIAGNOSIS — E1165 Type 2 diabetes mellitus with hyperglycemia: Secondary | ICD-10-CM

## 2019-07-26 LAB — POCT URINALYSIS DIPSTICK
Glucose, UA: POSITIVE — AB
Protein, UA: POSITIVE — AB
Spec Grav, UA: 1.01 (ref 1.010–1.025)
Urobilinogen, UA: 2 E.U./dL — AB
pH, UA: 5 (ref 5.0–8.0)

## 2019-07-26 LAB — URINALYSIS, MICROSCOPIC ONLY

## 2019-07-26 MED ORDER — FLUCONAZOLE 150 MG PO TABS
150.0000 mg | ORAL_TABLET | Freq: Once | ORAL | 0 refills | Status: AC
Start: 2019-07-26 — End: 2019-07-26

## 2019-07-26 MED ORDER — NITROFURANTOIN MONOHYD MACRO 100 MG PO CAPS
100.0000 mg | ORAL_CAPSULE | Freq: Two times a day (BID) | ORAL | 0 refills | Status: DC
Start: 2019-07-26 — End: 2020-08-07

## 2019-07-26 NOTE — Addendum Note (Signed)
Addended by: Larry Sierras on: 07/26/2019 11:24 AM   Modules accepted: Orders

## 2019-07-26 NOTE — Progress Notes (Signed)
Patient ID: Veronica Branch, female   DOB: October 27, 1940, 79 y.o.   MRN: [id number]   Virtual Visit via telephone Note  This visit type was conducted due to national recommendations for restrictions regarding the COVID-19 pandemic (e.g. social distancing).  This format is felt to be most appropriate for this patient at this time.  All issues noted in this document were discussed and addressed.  No physical exam was performed (except for noted visual exam findings with Video Visits).   I connected with Silvana Newness by telephone and verified that I am speaking with the correct person using two identifiers. Location patient: home Location provider: work  Persons participating in the telephone visit: patient, provider  The limitations, risks, security and privacy concerns of performing an evaluation and management service by telephone and the availability of in person appointments have been discussed.   It has also been discussed with the patient that there may be a patient responsible charge related to this service. The patient has expressed understanding and has agreed to proceed.   Reason for visit: work in appt.   HPI: Work in appt with concerns regarding a possible UTI.  States that starting approximately 4-5 days ago, she developed some pressure.  Some back ache and discomfort.  Increased urinary urgency and frequency.  No actual dysuria.  No hematuria.  With increased symptoms, took AZO.  Some leadage.  No nausea or vomiting. No fever.  No vagina itching or discharge.  Taking probiotics.    ROS: See pertinent positives and negatives per HPI.  Past Medical History:  Diagnosis Date  . Diverticulosis   . GERD (gastroesophageal reflux disease)   . Hiatal hernia   . History of migraine headaches   . Hypercholesterolemia   . Hyperglycemia   . Hypertension   . Hypothyroidism    s/p removal of right thyroid lobe and isthmus (1992)    Past Surgical History:  Procedure Laterality Date   . BREAST EXCISIONAL BIOPSY Right 2000   benign  . THYROID LOBECTOMY  1992   s/p removal of right thyroid lobe and isthmus    Family History  Problem Relation Age of Onset  . Coronary artery disease Mother        myocardial infarction and CHF  . Breast cancer Mother        60's  . Breast cancer Maternal Aunt        x 2  . Breast cancer Sister        23's  . Breast cancer Cousin        maternal side  . Breast cancer Other 51  . Colon cancer Neg Hx     SOCIAL HX: reviewed.    Current Outpatient Medications:  .  ACCU-CHEK GUIDE test strip, USE TO TEST BLOOD SUGAR ONCE DAILY, Disp: 100 strip, Rfl: 3 .  aspirin 81 MG tablet, Take 81 mg by mouth daily., Disp: , Rfl:  .  blood glucose meter kit and supplies KIT, Dispense based on patient and insurance preference. Use to check sugars once daily. Dx E11.9, Disp: 1 each, Rfl: 0 .  Lancets (ONETOUCH ULTRASOFT) lancets, Use to test blood sugars 1-2 times daily   E11.9, Disp: 100 each, Rfl: 12 .  lisinopril (ZESTRIL) 10 MG tablet, TAKE 1 TABLET BY MOUTH EVERY DAY, Disp: 90 tablet, Rfl: 3 .  metFORMIN (GLUCOPHAGE-XR) 500 MG 24 hr tablet, TAKE 1 TABLET BY MOUTH EVERY DAY WITH BREAKFAST, Disp: 90 tablet, Rfl: 1 .  nitrofurantoin, macrocrystal-monohydrate, (  MACROBID) 100 MG capsule, Take 1 capsule (100 mg total) by mouth 2 (two) times daily., Disp: 10 capsule, Rfl: 0 .  nystatin cream (MYCOSTATIN), Apply 1 application topically 2 (two) times daily., Disp: 30 g, Rfl: 0 .  omeprazole (PRILOSEC) 20 MG capsule, TAKE 1 CAPSULE BY MOUTH EVERY DAY, Disp: 90 capsule, Rfl: 0 .  polyethylene glycol (MIRALAX / GLYCOLAX) packet, Take 17 g by mouth daily. Mix one tablespoon with 8oz of your favorite juice or water every day until you are having soft formed stools. Then start taking once daily if you didn't have a stool the day before., Disp: 30 each, Rfl: 0 .  Probiotic Product (PROBIOTIC PO), Take 1 tablet by mouth daily., Disp: , Rfl:  .  propranolol  (INDERAL) 40 MG tablet, TAKE 1 TABLET BY MOUTH TWICE A DAY, Disp: 180 tablet, Rfl: 3 .  SYNTHROID 100 MCG tablet, TAKE 1 TABLET BY MOUTH EVERY DAY, Disp: 90 tablet, Rfl: 3  EXAM:  GENERAL: alert.  Sounds to be in no acute distress.  Answering questions appropriately.    PSYCH/NEURO: pleasant and cooperative, no obvious depression or anxiety, speech and thought processing grossly intact  ASSESSMENT AND PLAN:  Discussed the following assessment and plan:  Urinary urgency Symptoms as outlined.  Urine dip with positive nitrite, 21-50 wbc's and a few rbc's.  Given urine and symptoms, feel is c/w uti.  Will treat with macrobid.  Await culture results.  She request diflucan to have if needed.    Hypertension Has been under control.  Continue lisinopril.  Follow pressures.  Follow metabolic panel.    Meds ordered this encounter  Medications  . nitrofurantoin, macrocrystal-monohydrate, (MACROBID) 100 MG capsule    Sig: Take 1 capsule (100 mg total) by mouth 2 (two) times daily.    Dispense:  10 capsule    Refill:  0  . fluconazole (DIFLUCAN) 150 MG tablet    Sig: Take 1 tablet (150 mg total) by mouth once for 1 dose.    Dispense:  1 tablet    Refill:  0     I discussed the assessment and treatment plan with the patient. The patient was provided an opportunity to ask questions and all were answered. The patient agreed with the plan and demonstrated an understanding of the instructions.   The patient was advised to call back or seek an in-person evaluation if the symptoms worsen or if the condition fails to improve as anticipated.  I provided 22 minutes of non-face-to-face time during this encounter.   Einar Pheasant, MD

## 2019-07-27 LAB — URINE CULTURE
MICRO NUMBER:: 10670343
SPECIMEN QUALITY:: ADEQUATE

## 2019-07-28 ENCOUNTER — Other Ambulatory Visit: Payer: Self-pay | Admitting: Internal Medicine

## 2019-07-28 DIAGNOSIS — R319 Hematuria, unspecified: Secondary | ICD-10-CM

## 2019-07-28 NOTE — Progress Notes (Signed)
Order placed for f/u urinalysis 

## 2019-07-31 ENCOUNTER — Encounter: Payer: Self-pay | Admitting: Internal Medicine

## 2019-07-31 DIAGNOSIS — R3915 Urgency of urination: Secondary | ICD-10-CM | POA: Insufficient documentation

## 2019-07-31 NOTE — Assessment & Plan Note (Signed)
Has been under control.  Continue lisinopril.  Follow pressures.  Follow metabolic panel.

## 2019-07-31 NOTE — Assessment & Plan Note (Signed)
Symptoms as outlined.  Urine dip with positive nitrite, 21-50 wbc's and a few rbc's.  Given urine and symptoms, feel is c/w uti.  Will treat with macrobid.  Await culture results.  She request diflucan to have if needed.

## 2019-08-15 ENCOUNTER — Other Ambulatory Visit: Payer: Medicare PPO

## 2019-08-15 ENCOUNTER — Other Ambulatory Visit: Payer: Self-pay

## 2019-08-15 ENCOUNTER — Other Ambulatory Visit (INDEPENDENT_AMBULATORY_CARE_PROVIDER_SITE_OTHER): Payer: Medicare PPO

## 2019-08-15 DIAGNOSIS — R319 Hematuria, unspecified: Secondary | ICD-10-CM

## 2019-08-15 DIAGNOSIS — E7800 Pure hypercholesterolemia, unspecified: Secondary | ICD-10-CM

## 2019-08-15 DIAGNOSIS — E1165 Type 2 diabetes mellitus with hyperglycemia: Secondary | ICD-10-CM

## 2019-08-16 LAB — URINALYSIS, ROUTINE W REFLEX MICROSCOPIC
Bilirubin Urine: NEGATIVE
Ketones, ur: NEGATIVE
Leukocytes,Ua: NEGATIVE
Nitrite: NEGATIVE
Specific Gravity, Urine: 1.01 (ref 1.000–1.030)
Total Protein, Urine: NEGATIVE
Urine Glucose: NEGATIVE
Urobilinogen, UA: 0.2 (ref 0.0–1.0)
WBC, UA: NONE SEEN (ref 0–?)
pH: 7 (ref 5.0–8.0)

## 2019-09-15 ENCOUNTER — Other Ambulatory Visit: Payer: Self-pay | Admitting: Internal Medicine

## 2019-10-30 ENCOUNTER — Other Ambulatory Visit: Payer: Self-pay | Admitting: Internal Medicine

## 2019-10-31 ENCOUNTER — Other Ambulatory Visit: Payer: Self-pay | Admitting: Internal Medicine

## 2019-11-02 ENCOUNTER — Telehealth: Payer: Self-pay | Admitting: Internal Medicine

## 2019-11-02 NOTE — Telephone Encounter (Signed)
Patient called in with antibiotic that dentist gave her and her do not want to take it because she has never taken it before it is cephalexin 500 mg capsule and wanted Dr.Scott to call her a antibiotic in for her tooth.

## 2019-11-02 NOTE — Telephone Encounter (Signed)
Patient was given cephalexin 500 mg TID x 7 days for a tooth extraction that she had yesterday. She has never taken this in the past and just wanted to make sure it was okay. She was concerned about it causing diarrhea. Advised she could take a probiotic with it to help with that. She still wanted Dr Marina Goodell input. Advised that she is not in the office this PM. Patient stated she is ok to wait until tomorrow

## 2019-11-03 NOTE — Telephone Encounter (Signed)
Patient aware of below.

## 2019-11-03 NOTE — Telephone Encounter (Signed)
Per review of chart, she has taken omnicef previously and no report of problems when she took this medication. Truman Hayward is in the same family as cephalexin (the abx the dentist prescribed).  Any antibiotic can cause diarrhea.  If the dentist feels she needs given her recent procedure, I recommend taking a probiotic daily while on the abx and for two weeks after completing the abx.  Can also eat yogurt - activia.  Let us know if any problems.

## 2019-11-13 ENCOUNTER — Other Ambulatory Visit: Payer: Self-pay | Admitting: Internal Medicine

## 2019-11-14 ENCOUNTER — Ambulatory Visit (INDEPENDENT_AMBULATORY_CARE_PROVIDER_SITE_OTHER): Payer: Medicare PPO | Admitting: Internal Medicine

## 2019-11-14 ENCOUNTER — Other Ambulatory Visit: Payer: Self-pay

## 2019-11-14 ENCOUNTER — Encounter: Payer: Self-pay | Admitting: Internal Medicine

## 2019-11-14 VITALS — BP 130/82 | HR 60 | Temp 98.2°F | Ht 60.12 in | Wt 177.8 lb

## 2019-11-14 DIAGNOSIS — R140 Abdominal distension (gaseous): Secondary | ICD-10-CM

## 2019-11-14 DIAGNOSIS — E559 Vitamin D deficiency, unspecified: Secondary | ICD-10-CM | POA: Diagnosis not present

## 2019-11-14 DIAGNOSIS — E1165 Type 2 diabetes mellitus with hyperglycemia: Secondary | ICD-10-CM

## 2019-11-14 DIAGNOSIS — E7800 Pure hypercholesterolemia, unspecified: Secondary | ICD-10-CM | POA: Diagnosis not present

## 2019-11-14 DIAGNOSIS — E039 Hypothyroidism, unspecified: Secondary | ICD-10-CM | POA: Diagnosis not present

## 2019-11-14 DIAGNOSIS — I10 Essential (primary) hypertension: Secondary | ICD-10-CM

## 2019-11-14 DIAGNOSIS — Z0000 Encounter for general adult medical examination without abnormal findings: Secondary | ICD-10-CM | POA: Diagnosis not present

## 2019-11-14 DIAGNOSIS — Z23 Encounter for immunization: Secondary | ICD-10-CM | POA: Diagnosis not present

## 2019-11-14 DIAGNOSIS — I700 Atherosclerosis of aorta: Secondary | ICD-10-CM | POA: Diagnosis not present

## 2019-11-14 NOTE — Assessment & Plan Note (Signed)
Physical today 11/14/19.  Mammogram 04/30/19 - Birads I.  Declines colonoscopy and cologuard.

## 2019-11-14 NOTE — Progress Notes (Signed)
Patient ID: Veronica Branch, female   DOB: [birth year], 79 y.o.   MRN: [id number]   Subjective:    Patient ID: Veronica Branch, female    DOB: 06/24/1940, 79 y.o.   MRN: [id number]  HPI This visit occurred during the SARS-CoV-2 public health emergency.  Safety protocols were in place, including screening questions prior to the visit, additional usage of staff PPE, and extensive cleaning of exam room while observing appropriate contact time as indicated for disinfecting solutions.  Patient here for her physical exam.  She reports she is doing relatively well.  Had tooth pulled two weeks ago.  Has f/u 11/22/19.  Discussed diet and exercise.  AM sugars 120s.  PM sugars 140. History of indigestion.  Takes prilosec prn.  Feels more swollen.  Discussed taking omprazole on a regular basis.  Bowels moving.  Handling stress.    Past Medical History:  Diagnosis Date  . Diverticulosis   . GERD (gastroesophageal reflux disease)   . Hiatal hernia   . History of migraine headaches   . Hypercholesterolemia   . Hyperglycemia   . Hypertension   . Hypothyroidism    s/p removal of right thyroid lobe and isthmus (1992)   Past Surgical History:  Procedure Laterality Date  . BREAST EXCISIONAL BIOPSY Right 2000   benign  . THYROID LOBECTOMY  1992   s/p removal of right thyroid lobe and isthmus   Family History  Problem Relation Age of Onset  . Coronary artery disease Mother        myocardial infarction and CHF  . Breast cancer Mother        72's  . Breast cancer Maternal Aunt        x 2  . Breast cancer Sister        8's  . Breast cancer Cousin        maternal side  . Breast cancer Other 51  . Colon cancer Neg Hx    Social History   Socioeconomic History  . Marital status: Married    Spouse name: Not on file  . Number of children: 2  . Years of education: Not on file  . Highest education level: Not on file  Occupational History  . Occupation: retired Pharmacist, hospital  Tobacco Use  . Smoking  status: Never Smoker  . Smokeless tobacco: Never Used  Substance and Sexual Activity  . Alcohol use: No    Alcohol/week: 0.0 standard drinks  . Drug use: No  . Sexual activity: Yes  Other Topics Concern  . Not on file  Social History Narrative   Regularly exercises. Retired and married.    Social Determinants of Health   Financial Resource Strain:   . Difficulty of Paying Living Expenses: Not on file  Food Insecurity:   . Worried About Charity fundraiser in the Last Year: Not on file  . Ran Out of Food in the Last Year: Not on file  Transportation Needs:   . Lack of Transportation (Medical): Not on file  . Lack of Transportation (Non-Medical): Not on file  Physical Activity:   . Days of Exercise per Week: Not on file  . Minutes of Exercise per Session: Not on file  Stress:   . Feeling of Stress : Not on file  Social Connections:   . Frequency of Communication with Friends and Family: Not on file  . Frequency of Social Gatherings with Friends and Family: Not on file  . Attends Religious Services: Not on  file  . Active Member of Clubs or Organizations: Not on file  . Attends Archivist Meetings: Not on file  . Marital Status: Not on file    Outpatient Encounter Medications as of 11/14/2019  Medication Sig  . ACCU-CHEK GUIDE test strip USE TO TEST BLOOD SUGAR ONCE DAILY  . aspirin 81 MG tablet Take 81 mg by mouth daily.  . blood glucose meter kit and supplies KIT Dispense based on patient and insurance preference. Use to check sugars once daily. Dx E11.9  . Lancets (ONETOUCH ULTRASOFT) lancets Use to test blood sugars 1-2 times daily   E11.9  . lisinopril (ZESTRIL) 10 MG tablet TAKE 1 TABLET BY MOUTH EVERY DAY  . metFORMIN (GLUCOPHAGE-XR) 500 MG 24 hr tablet TAKE 1 TABLET BY MOUTH EVERY DAY WITH BREAKFAST  . nitrofurantoin, macrocrystal-monohydrate, (MACROBID) 100 MG capsule Take 1 capsule (100 mg total) by mouth 2 (two) times daily.  Marland Kitchen nystatin cream (MYCOSTATIN)  Apply 1 application topically 2 (two) times daily.  Marland Kitchen omeprazole (PRILOSEC) 20 MG capsule TAKE 1 CAPSULE BY MOUTH EVERY DAY (Patient taking differently: as needed. )  . polyethylene glycol (MIRALAX / GLYCOLAX) packet Take 17 g by mouth daily. Mix one tablespoon with 8oz of your favorite juice or water every day until you are having soft formed stools. Then start taking once daily if you didn't have a stool the day before. (Patient taking differently: Take 17 g by mouth as needed. Mix one tablespoon with 8oz of your favorite juice or water every day until you are having soft formed stools. Then start taking once daily if you didn't have a stool the day before.)  . Probiotic Product (PROBIOTIC PO) Take 1 tablet by mouth daily.  . propranolol (INDERAL) 40 MG tablet TAKE 1 TABLET BY MOUTH TWICE A DAY  . SYNTHROID 100 MCG tablet TAKE 1 TABLET BY MOUTH EVERY DAY  . [DISCONTINUED] Blood Glucose Monitoring Suppl (ONE TOUCH ULTRA SYSTEM KIT) W/DEVICE KIT 1 kit by Does not apply route once.   No facility-administered encounter medications on file as of 11/14/2019.    Review of Systems  Constitutional: Negative for appetite change and unexpected weight change.  HENT: Negative for congestion, sinus pressure and sore throat.   Eyes: Negative for pain and visual disturbance.  Respiratory: Negative for cough, chest tightness and shortness of breath.   Cardiovascular: Negative for chest pain and palpitations.       Some intermittent swelling - feet.   Gastrointestinal: Negative for abdominal pain, diarrhea, nausea and vomiting.  Genitourinary: Negative for difficulty urinating and dysuria.  Musculoskeletal: Negative for joint swelling and myalgias.  Skin: Negative for color change and rash.  Neurological: Negative for dizziness, light-headedness and headaches.  Hematological: Negative for adenopathy. Does not bruise/bleed easily.  Psychiatric/Behavioral: Negative for agitation and dysphoric mood.        Objective:    Physical Exam Vitals reviewed.  Constitutional:      General: She is not in acute distress.    Appearance: Normal appearance. She is well-developed.  HENT:     Head: Normocephalic and atraumatic.     Right Ear: External ear normal.     Left Ear: External ear normal.  Eyes:     General: No scleral icterus.       Right eye: No discharge.        Left eye: No discharge.     Conjunctiva/sclera: Conjunctivae normal.  Neck:     Thyroid: No thyromegaly.  Cardiovascular:  Rate and Rhythm: Normal rate and regular rhythm.  Pulmonary:     Effort: No tachypnea, accessory muscle usage or respiratory distress.     Breath sounds: Normal breath sounds. No decreased breath sounds or wheezing.  Chest:     Breasts:        Right: No inverted nipple, mass, nipple discharge or tenderness (no axillary adenopathy).        Left: No inverted nipple, mass, nipple discharge or tenderness (no axilarry adenopathy).  Abdominal:     General: Bowel sounds are normal.     Palpations: Abdomen is soft.     Tenderness: There is no abdominal tenderness.  Musculoskeletal:        General: No swelling or tenderness.     Cervical back: Neck supple. No tenderness.  Lymphadenopathy:     Cervical: No cervical adenopathy.  Skin:    Findings: No erythema or rash.  Neurological:     Mental Status: She is alert and oriented to person, place, and time.  Psychiatric:        Mood and Affect: Mood normal.        Behavior: Behavior normal.     BP 130/82 (BP Location: Left Arm, Patient Position: Sitting, Cuff Size: Large)   Pulse 60   Temp 98.2 F (36.8 C) (Oral)   Ht 5' 0.12" (1.527 m)   Wt 177 lb 12.8 oz (80.6 kg)   SpO2 97%   BMI 34.59 kg/m  Wt Readings from Last 3 Encounters:  11/14/19 177 lb 12.8 oz (80.6 kg)  07/26/19 177 lb (80.3 kg)  07/14/19 177 lb (80.3 kg)     Lab Results  Component Value Date   WBC 7.2 09/20/2018   HGB 13.8 09/20/2018   HCT 41.4 09/20/2018   PLT 246.0  09/20/2018   GLUCOSE 132 (H) 07/21/2019   CHOL 163 07/21/2019   TRIG 90.0 07/21/2019   HDL 47.60 07/21/2019   LDLDIRECT 156.0 12/06/2012   LDLCALC 97 07/21/2019   ALT 15 07/21/2019   AST 10 07/21/2019   NA 142 07/21/2019   K 4.2 07/21/2019   CL 107 07/21/2019   CREATININE 0.69 07/21/2019   BUN 8 07/21/2019   CO2 29 07/21/2019   TSH 0.36 02/22/2019   HGBA1C 6.9 (H) 07/21/2019   MICROALBUR 2.3 (H) 02/22/2019    MM 3D SCREEN BREAST BILATERAL  Result Date: 05/03/2019 CLINICAL DATA:  Screening. EXAM: DIGITAL SCREENING BILATERAL MAMMOGRAM WITH TOMO AND CAD COMPARISON:  Previous exam(s). ACR Breast Density Category b: There are scattered areas of fibroglandular density. FINDINGS: There are no findings suspicious for malignancy. Images were processed with CAD. IMPRESSION: No mammographic evidence of malignancy. A result letter of this screening mammogram will be mailed directly to the patient. RECOMMENDATION: Screening mammogram in one year. (Code:SM-B-01Y) BI-RADS CATEGORY  1: Negative. Electronically Signed   By: Audie Pinto M.D.   On: 05/03/2019 15:36       Assessment & Plan:   Problem List Items Addressed This Visit    Vitamin D deficiency    Follow vitamin D level.       Hypothyroidism    On thyroid replacement.  Follow tsh.       Hypertension    Blood pressure has been under good control.  Continue lisinopril.  Follow pressures. Follow metabolic panel.       Hypercholesterolemia    Not taking crestor.  Low cholesterol diet and exercise.  Follow lipid panel.       Health care  maintenance    Physical today 11/14/19.  Mammogram 04/30/19 - Birads I.  Declines colonoscopy and cologuard.        Diabetes ([place])    On metformin.  Sugars as outlined.  Low carb diet and exercise.  Follow met b and a1c.       Aortic atherosclerosis ([place])    Off crestor.  Low cholesterol diet and exercise.  Follow.       Abdominal bloating    Discussed.  Take omeprazole daily.   Discussed colonoscopy - overdue.  Declines.  Declines cologuard.  Follow.         Other Visit Diagnoses    Routine general medical examination at a health care facility    -  Primary   Needs flu shot       Relevant Orders   Flu Vaccine QUAD High Dose(Fluad)   Need for immunization against influenza       Relevant Orders   Flu Vaccine QUAD High Dose(Fluad) (Completed)       Einar Pheasant, MD

## 2019-11-20 ENCOUNTER — Encounter: Payer: Self-pay | Admitting: Internal Medicine

## 2019-11-20 NOTE — Assessment & Plan Note (Signed)
Not taking crestor.  Low cholesterol diet and exercise.  Follow lipid panel.  °

## 2019-11-20 NOTE — Assessment & Plan Note (Signed)
Follow vitamin D level.  

## 2019-11-20 NOTE — Assessment & Plan Note (Signed)
On metformin.  Sugars as outlined.  Low carb diet and exercise.  Follow met b and a1c.

## 2019-11-20 NOTE — Assessment & Plan Note (Signed)
On thyroid replacement.  Follow tsh.  

## 2019-11-20 NOTE — Assessment & Plan Note (Signed)
Off crestor.  Low cholesterol diet and exercise.  Follow.

## 2019-11-20 NOTE — Assessment & Plan Note (Signed)
Blood pressure has been under good control.  Continue lisinopril.  Follow pressures.  Follow metabolic panel.  

## 2019-11-20 NOTE — Assessment & Plan Note (Signed)
Discussed.  Take omeprazole daily.  Discussed colonoscopy - overdue.  Declines.  Declines cologuard.  Follow.

## 2019-11-29 ENCOUNTER — Other Ambulatory Visit: Payer: Medicare PPO

## 2019-11-30 ENCOUNTER — Other Ambulatory Visit: Payer: Self-pay

## 2019-11-30 ENCOUNTER — Other Ambulatory Visit (INDEPENDENT_AMBULATORY_CARE_PROVIDER_SITE_OTHER): Payer: Medicare PPO

## 2019-11-30 DIAGNOSIS — E7800 Pure hypercholesterolemia, unspecified: Secondary | ICD-10-CM | POA: Diagnosis not present

## 2019-11-30 DIAGNOSIS — E1165 Type 2 diabetes mellitus with hyperglycemia: Secondary | ICD-10-CM | POA: Diagnosis not present

## 2019-11-30 LAB — BASIC METABOLIC PANEL
BUN: 9 mg/dL (ref 6–23)
CO2: 30 mEq/L (ref 19–32)
Calcium: 9.2 mg/dL (ref 8.4–10.5)
Chloride: 104 mEq/L (ref 96–112)
Creatinine, Ser: 0.68 mg/dL (ref 0.40–1.20)
GFR: 83.6 mL/min (ref >60.00)
Glucose, Bld: 146 mg/dL — ABNORMAL HIGH (ref 70–99)
Potassium: 4.2 mEq/L (ref 3.5–5.1)
Sodium: 140 mEq/L (ref 135–145)

## 2019-11-30 LAB — CBC WITH DIFFERENTIAL/PLATELET
Basophils Absolute: 0 10*3/uL (ref 0.0–0.1)
Basophils Relative: 0.3 % (ref 0.0–3.0)
Eosinophils Absolute: 0.2 10*3/uL (ref 0.0–0.7)
Eosinophils Relative: 3.1 % (ref 0.0–5.0)
HCT: 42.8 % (ref 36.0–46.0)
Hemoglobin: 14.4 g/dL (ref 12.0–15.0)
Lymphocytes Relative: 22.5 % (ref 12.0–46.0)
Lymphs Abs: 1.7 10*3/uL (ref 0.7–4.0)
MCHC: 33.7 g/dL (ref 30.0–36.0)
MCV: 89.4 fl (ref 78.0–100.0)
Monocytes Absolute: 0.5 10*3/uL (ref 0.1–1.0)
Monocytes Relative: 6.7 % (ref 3.0–12.0)
Neutro Abs: 5.2 10*3/uL (ref 1.4–7.7)
Neutrophils Relative %: 67.4 % (ref 43.0–77.0)
Platelets: 244 10*3/uL (ref 150.0–400.0)
RBC: 4.79 Mil/uL (ref 3.87–5.11)
RDW: 13 % (ref 11.5–15.5)
WBC: 7.7 10*3/uL (ref 4.0–10.5)

## 2019-11-30 LAB — LIPID PANEL
Cholesterol: 172 mg/dL (ref 0–200)
HDL: 46.7 mg/dL (ref >39.00)
LDL Cholesterol: 103 mg/dL — ABNORMAL HIGH (ref 0–99)
NonHDL: 124.86
Total CHOL/HDL Ratio: 4
Triglycerides: 107 mg/dL (ref 0.0–149.0)
VLDL: 21.4 mg/dL (ref 0.0–40.0)

## 2019-11-30 LAB — HEPATIC FUNCTION PANEL
ALT: 13 U/L (ref 0–35)
AST: 11 U/L (ref 0–37)
Albumin: 4 g/dL (ref 3.5–5.2)
Alkaline Phosphatase: 75 U/L (ref 39–117)
Bilirubin, Direct: 0.1 mg/dL (ref 0.0–0.3)
Total Bilirubin: 0.6 mg/dL (ref 0.2–1.2)
Total Protein: 6.4 g/dL (ref 6.0–8.3)

## 2019-11-30 LAB — HEMOGLOBIN A1C: Hgb A1c MFr Bld: 7 % — ABNORMAL HIGH (ref 4.6–6.5)

## 2019-12-01 DIAGNOSIS — E113393 Type 2 diabetes mellitus with moderate nonproliferative diabetic retinopathy without macular edema, bilateral: Secondary | ICD-10-CM | POA: Diagnosis not present

## 2020-01-26 ENCOUNTER — Ambulatory Visit: Payer: Medicare PPO | Admitting: Internal Medicine

## 2020-03-18 ENCOUNTER — Other Ambulatory Visit: Payer: Self-pay | Admitting: Internal Medicine

## 2020-03-26 ENCOUNTER — Other Ambulatory Visit: Payer: Self-pay

## 2020-03-26 ENCOUNTER — Telehealth: Payer: Self-pay | Admitting: Internal Medicine

## 2020-03-26 ENCOUNTER — Ambulatory Visit: Payer: Medicare PPO | Admitting: Internal Medicine

## 2020-03-26 ENCOUNTER — Encounter: Payer: Self-pay | Admitting: Internal Medicine

## 2020-03-26 VITALS — BP 118/68 | HR 70 | Temp 97.7°F | Ht 60.0 in | Wt 178.0 lb

## 2020-03-26 DIAGNOSIS — I700 Atherosclerosis of aorta: Secondary | ICD-10-CM

## 2020-03-26 DIAGNOSIS — E1165 Type 2 diabetes mellitus with hyperglycemia: Secondary | ICD-10-CM | POA: Diagnosis not present

## 2020-03-26 DIAGNOSIS — E7800 Pure hypercholesterolemia, unspecified: Secondary | ICD-10-CM

## 2020-03-26 DIAGNOSIS — E039 Hypothyroidism, unspecified: Secondary | ICD-10-CM | POA: Diagnosis not present

## 2020-03-26 DIAGNOSIS — I10 Essential (primary) hypertension: Secondary | ICD-10-CM | POA: Diagnosis not present

## 2020-03-26 DIAGNOSIS — Z1231 Encounter for screening mammogram for malignant neoplasm of breast: Secondary | ICD-10-CM

## 2020-03-26 DIAGNOSIS — Z8669 Personal history of other diseases of the nervous system and sense organs: Secondary | ICD-10-CM

## 2020-03-26 NOTE — Telephone Encounter (Signed)
Patient would like to have lab before her 7-7 appointment

## 2020-03-26 NOTE — Progress Notes (Signed)
Subjective:    Patient ID: Veronica Branch, female    DOB: July 21, 1940, 80 y.o.   MRN: [id number]  HPI This visit occurred during the SARS-CoV-2 public health emergency.  Safety protocols were in place, including screening questions prior to the visit, additional usage of staff PPE, and extensive cleaning of exam room while observing appropriate contact time as indicated for disinfecting solutions.  Patient here for a scheduled follow up. Here to follow up regarding her blood sugar, blood pressure and cholesterol.  She feels she is doing relatively well.  Is exercising some.  Started a 15 minute regimen.  No chest pain or sob with increased activity or exertion - reported.  Takes omeprazole prn.  Acid reflux controlled.  No abdominal pain reported.  Bowels moving if takes miralax prn.  claritin - controls pollen symptoms.  States in am blood sugar 110-115.  Discussed continued diet and exercise.    Past Medical History:  Diagnosis Date  . Diverticulosis   . GERD (gastroesophageal reflux disease)   . Hiatal hernia   . History of migraine headaches   . Hypercholesterolemia   . Hyperglycemia   . Hypertension   . Hypothyroidism    s/p removal of right thyroid lobe and isthmus (1992)   Past Surgical History:  Procedure Laterality Date  . BREAST EXCISIONAL BIOPSY Right 2000   benign  . THYROID LOBECTOMY  1992   s/p removal of right thyroid lobe and isthmus   Family History  Problem Relation Age of Onset  . Coronary artery disease Mother        myocardial infarction and CHF  . Breast cancer Mother        40's  . Breast cancer Maternal Aunt        x 2  . Breast cancer Sister        16's  . Breast cancer Cousin        maternal side  . Breast cancer Other 51  . Colon cancer Neg Hx    Social History   Socioeconomic History  . Marital status: Married    Spouse name: Not on file  . Number of children: 2  . Years of education: Not on file  . Highest education level: Not on file   Occupational History  . Occupation: retired Pharmacist, hospital  Tobacco Use  . Smoking status: Never Smoker  . Smokeless tobacco: Never Used  Substance and Sexual Activity  . Alcohol use: No    Alcohol/week: 0.0 standard drinks  . Drug use: No  . Sexual activity: Yes  Other Topics Concern  . Not on file  Social History Narrative   Regularly exercises. Retired and married.    Social Determinants of Health   Financial Resource Strain: Not on file  Food Insecurity: Not on file  Transportation Needs: Not on file  Physical Activity: Not on file  Stress: Not on file  Social Connections: Not on file    Outpatient Encounter Medications as of 03/26/2020  Medication Sig  . ACCU-CHEK GUIDE test strip USE TO TEST BLOOD SUGAR ONCE DAILY  . aspirin 81 MG tablet Take 81 mg by mouth daily.  . blood glucose meter kit and supplies KIT Dispense based on patient and insurance preference. Use to check sugars once daily. Dx E11.9  . Lancets (ONETOUCH ULTRASOFT) lancets Use to test blood sugars 1-2 times daily   E11.9  . lisinopril (ZESTRIL) 10 MG tablet TAKE 1 TABLET BY MOUTH EVERY DAY  . metFORMIN (GLUCOPHAGE-XR)  500 MG 24 hr tablet TAKE 1 TABLET BY MOUTH EVERY DAY WITH BREAKFAST  . nitrofurantoin, macrocrystal-monohydrate, (MACROBID) 100 MG capsule Take 1 capsule (100 mg total) by mouth 2 (two) times daily.  Marland Kitchen nystatin cream (MYCOSTATIN) Apply 1 application topically 2 (two) times daily.  Marland Kitchen omeprazole (PRILOSEC) 20 MG capsule TAKE 1 CAPSULE BY MOUTH EVERY DAY (Patient taking differently: as needed.)  . polyethylene glycol (MIRALAX / GLYCOLAX) packet Take 17 g by mouth daily. Mix one tablespoon with 8oz of your favorite juice or water every day until you are having soft formed stools. Then start taking once daily if you didn't have a stool the day before. (Patient taking differently: Take 17 g by mouth as needed. Mix one tablespoon with 8oz of your favorite juice or water every day until you are having soft  formed stools. Then start taking once daily if you didn't have a stool the day before.)  . Probiotic Product (PROBIOTIC PO) Take 1 tablet by mouth daily.  . propranolol (INDERAL) 40 MG tablet TAKE 1 TABLET BY MOUTH TWICE A DAY  . SYNTHROID 100 MCG tablet TAKE 1 TABLET BY MOUTH EVERY DAY  . [DISCONTINUED] Blood Glucose Monitoring Suppl (ONE TOUCH ULTRA SYSTEM KIT) W/DEVICE KIT 1 kit by Does not apply route once.   No facility-administered encounter medications on file as of 03/26/2020.    Review of Systems  Constitutional: Negative for appetite change and unexpected weight change.  HENT: Negative for congestion and sinus pressure.   Respiratory: Negative for cough, chest tightness and shortness of breath.   Cardiovascular: Negative for chest pain, palpitations and leg swelling.  Gastrointestinal: Negative for abdominal pain, diarrhea, nausea and vomiting.  Genitourinary: Negative for difficulty urinating and dysuria.  Musculoskeletal: Negative for joint swelling and myalgias.  Skin: Negative for color change and rash.  Neurological: Negative for dizziness, light-headedness and headaches.  Psychiatric/Behavioral: Negative for agitation and dysphoric mood.       Objective:    Physical Exam Vitals reviewed.  Constitutional:      General: She is not in acute distress.    Appearance: Normal appearance.  HENT:     Head: Normocephalic and atraumatic.     Right Ear: External ear normal.     Left Ear: External ear normal.  Eyes:     General: No scleral icterus.       Right eye: No discharge.        Left eye: No discharge.     Conjunctiva/sclera: Conjunctivae normal.  Neck:     Thyroid: No thyromegaly.  Cardiovascular:     Rate and Rhythm: Normal rate and regular rhythm.  Pulmonary:     Effort: No respiratory distress.     Breath sounds: Normal breath sounds. No wheezing.  Abdominal:     General: Bowel sounds are normal.     Palpations: Abdomen is soft.     Tenderness: There is no  abdominal tenderness.  Musculoskeletal:        General: No swelling or tenderness.     Cervical back: Neck supple. No tenderness.  Lymphadenopathy:     Cervical: No cervical adenopathy.  Skin:    Findings: No erythema or rash.  Neurological:     Mental Status: She is alert.  Psychiatric:        Mood and Affect: Mood normal.        Behavior: Behavior normal.     BP 118/68   Pulse 70   Temp 97.7 F (36.5 C)  Ht 5' (1.524 m)   Wt 178 lb (80.7 kg)   SpO2 98%   BMI 34.76 kg/m  Wt Readings from Last 3 Encounters:  03/26/20 178 lb (80.7 kg)  11/14/19 177 lb 12.8 oz (80.6 kg)  07/26/19 177 lb (80.3 kg)     Lab Results  Component Value Date   WBC 7.7 11/30/2019   HGB 14.4 11/30/2019   HCT 42.8 11/30/2019   PLT 244.0 11/30/2019   GLUCOSE 146 (H) 11/30/2019   CHOL 172 11/30/2019   TRIG 107.0 11/30/2019   HDL 46.70 11/30/2019   LDLDIRECT 156.0 12/06/2012   LDLCALC 103 (H) 11/30/2019   ALT 13 11/30/2019   AST 11 11/30/2019   NA 140 11/30/2019   K 4.2 11/30/2019   CL 104 11/30/2019   CREATININE 0.68 11/30/2019   BUN 9 11/30/2019   CO2 30 11/30/2019   TSH 0.36 02/22/2019   HGBA1C 7.0 (H) 11/30/2019   MICROALBUR 2.3 (H) 02/22/2019    MM 3D SCREEN BREAST BILATERAL  Result Date: 05/03/2019 CLINICAL DATA:  Screening. EXAM: DIGITAL SCREENING BILATERAL MAMMOGRAM WITH TOMO AND CAD COMPARISON:  Previous exam(s). ACR Breast Density Category b: There are scattered areas of fibroglandular density. FINDINGS: There are no findings suspicious for malignancy. Images were processed with CAD. IMPRESSION: No mammographic evidence of malignancy. A result letter of this screening mammogram will be mailed directly to the patient. RECOMMENDATION: Screening mammogram in one year. (Code:[id number]-B-01Y) BI-RADS CATEGORY  1: Negative. Electronically Signed   By: Audie Pinto M.D.   On: 05/03/2019 15:36       Assessment & Plan:   Problem List Items Addressed This Visit    Aortic  atherosclerosis ([place])    Has stopped crestor.  Desires not to restart statin medication.  Follow.        Diabetes ([place])    On metformin.  Low carb diet and exercise.  Follow met b and a1c.       History of migraine headaches    Stable.       Hypercholesterolemia    Not on a statin.  Discussed calculated cholesterol risk. She wants to work on diet and exercise.  Follow lipid panel.       Hypertension    Continue lisinopril.  Blood pressure controlled.  Follow pressures.  Follow metabolic panel.       Hypothyroidism    On thyroid replacement.  Follow tsh.        Other Visit Diagnoses    Encounter for screening mammogram for malignant neoplasm of breast    -  Primary   Relevant Orders   MM 3D SCREEN BREAST BILATERAL       Einar Pheasant, MD

## 2020-03-29 ENCOUNTER — Telehealth: Payer: Self-pay | Admitting: *Deleted

## 2020-03-29 DIAGNOSIS — E1165 Type 2 diabetes mellitus with hyperglycemia: Secondary | ICD-10-CM

## 2020-03-29 NOTE — Telephone Encounter (Signed)
Scheduled

## 2020-03-29 NOTE — Telephone Encounter (Signed)
Please place future orders for lab appt.  

## 2020-03-29 NOTE — Telephone Encounter (Signed)
Labs ordered. Please schedule lab appt prior to 7/7 appt.

## 2020-03-30 NOTE — Telephone Encounter (Signed)
Orders placed for labs and urine microalbumin/creatinine ratio.  These were ordered on two separate days.  Need all of these drawn/checked.  Thanks.

## 2020-04-01 ENCOUNTER — Encounter: Payer: Self-pay | Admitting: Internal Medicine

## 2020-04-01 NOTE — Assessment & Plan Note (Signed)
Stable

## 2020-04-01 NOTE — Assessment & Plan Note (Signed)
Continue lisinopril.  Blood pressure controlled.  Follow pressures.  Follow metabolic panel.

## 2020-04-01 NOTE — Assessment & Plan Note (Signed)
Has stopped crestor.  Desires not to restart statin medication.  Follow.

## 2020-04-01 NOTE — Assessment & Plan Note (Signed)
Not on a statin.  Discussed calculated cholesterol risk. She wants to work on diet and exercise.  Follow lipid panel.

## 2020-04-01 NOTE — Assessment & Plan Note (Signed)
On metformin.  Low carb diet and exercise.  Follow met b and a1c.   

## 2020-04-01 NOTE — Assessment & Plan Note (Signed)
On thyroid replacement.  Follow tsh.  

## 2020-04-02 ENCOUNTER — Other Ambulatory Visit: Payer: Self-pay

## 2020-04-02 ENCOUNTER — Other Ambulatory Visit (INDEPENDENT_AMBULATORY_CARE_PROVIDER_SITE_OTHER): Payer: Medicare PPO

## 2020-04-02 DIAGNOSIS — I10 Essential (primary) hypertension: Secondary | ICD-10-CM | POA: Diagnosis not present

## 2020-04-02 DIAGNOSIS — E1165 Type 2 diabetes mellitus with hyperglycemia: Secondary | ICD-10-CM

## 2020-04-02 DIAGNOSIS — E7800 Pure hypercholesterolemia, unspecified: Secondary | ICD-10-CM

## 2020-04-02 LAB — BASIC METABOLIC PANEL
BUN: 8 mg/dL (ref 6–23)
CO2: 28 mEq/L (ref 19–32)
Calcium: 9.1 mg/dL (ref 8.4–10.5)
Chloride: 105 mEq/L (ref 96–112)
Creatinine, Ser: 0.65 mg/dL (ref 0.40–1.20)
GFR: 84.32 mL/min (ref >60.00)
Glucose, Bld: 130 mg/dL — ABNORMAL HIGH (ref 70–99)
Potassium: 3.9 mEq/L (ref 3.5–5.1)
Sodium: 140 mEq/L (ref 135–145)

## 2020-04-02 LAB — LIPID PANEL
Cholesterol: 172 mg/dL (ref 0–200)
HDL: 50 mg/dL (ref >39.00)
LDL Cholesterol: 106 mg/dL — ABNORMAL HIGH (ref 0–99)
NonHDL: 121.58
Total CHOL/HDL Ratio: 3
Triglycerides: 78 mg/dL (ref 0.0–149.0)
VLDL: 15.6 mg/dL (ref 0.0–40.0)

## 2020-04-02 LAB — HEPATIC FUNCTION PANEL
ALT: 12 U/L (ref 0–35)
AST: 9 U/L (ref 0–37)
Albumin: 3.8 g/dL (ref 3.5–5.2)
Alkaline Phosphatase: 75 U/L (ref 39–117)
Bilirubin, Direct: 0.1 mg/dL (ref 0.0–0.3)
Total Bilirubin: 0.5 mg/dL (ref 0.2–1.2)
Total Protein: 6.5 g/dL (ref 6.0–8.3)

## 2020-04-02 LAB — MICROALBUMIN / CREATININE URINE RATIO
Creatinine,U: 77.8 mg/dL
Microalb Creat Ratio: 1.3 mg/g (ref 0.0–30.0)
Microalb, Ur: 1 mg/dL (ref 0.0–1.9)

## 2020-04-02 LAB — TSH: TSH: 0.44 u[IU]/mL (ref 0.35–4.50)

## 2020-04-02 LAB — HEMOGLOBIN A1C: Hgb A1c MFr Bld: 6.9 % — ABNORMAL HIGH (ref 4.6–6.5)

## 2020-04-26 ENCOUNTER — Encounter: Payer: Self-pay | Admitting: Internal Medicine

## 2020-05-09 ENCOUNTER — Telehealth: Payer: Self-pay | Admitting: Internal Medicine

## 2020-05-09 NOTE — Telephone Encounter (Signed)
Left message for patient to call back and schedule Medicare Annual Wellness Visit (AWV)   Please schedule on the [facility] Nurse Health Advisor template  Last AWV 02/11/16; please schedule at anytime   This should be a 45 minute visit

## 2020-05-22 ENCOUNTER — Other Ambulatory Visit: Payer: Self-pay

## 2020-05-22 ENCOUNTER — Ambulatory Visit
Admission: RE | Admit: 2020-05-22 | Discharge: 2020-05-22 | Disposition: A | Discharge status: Home / Self Care | Payer: Medicare PPO | Source: Ambulatory Visit | Attending: Internal Medicine | Admitting: Internal Medicine

## 2020-05-22 DIAGNOSIS — Z1231 Encounter for screening mammogram for malignant neoplasm of breast: Secondary | ICD-10-CM | POA: Diagnosis not present

## 2020-05-24 DIAGNOSIS — E113393 Type 2 diabetes mellitus with moderate nonproliferative diabetic retinopathy without macular edema, bilateral: Secondary | ICD-10-CM | POA: Diagnosis not present

## 2020-06-05 ENCOUNTER — Telehealth: Payer: Self-pay | Admitting: Internal Medicine

## 2020-06-05 NOTE — Telephone Encounter (Signed)
Left message for patient to call back and schedule Medicare Annual Wellness Visit (AWV) in office.   If not able to come in office, please offer to do virtually or by telephone.   Last AWV:02/11/2016   Please schedule at anytime with Nurse Health Advisor.

## 2020-06-15 ENCOUNTER — Emergency Department
Admission: EM | Admit: 2020-06-15 | Discharge: 2020-06-15 | Disposition: A | Discharge status: Home / Self Care | Payer: Medicare PPO | Attending: Emergency Medicine | Admitting: Emergency Medicine

## 2020-06-15 ENCOUNTER — Encounter: Payer: Self-pay | Admitting: Emergency Medicine

## 2020-06-15 ENCOUNTER — Emergency Department: Payer: Medicare PPO

## 2020-06-15 ENCOUNTER — Other Ambulatory Visit: Payer: Self-pay

## 2020-06-15 DIAGNOSIS — R531 Weakness: Secondary | ICD-10-CM | POA: Diagnosis not present

## 2020-06-15 DIAGNOSIS — K5730 Diverticulosis of large intestine without perforation or abscess without bleeding: Secondary | ICD-10-CM | POA: Diagnosis not present

## 2020-06-15 DIAGNOSIS — R059 Cough, unspecified: Secondary | ICD-10-CM | POA: Diagnosis not present

## 2020-06-15 DIAGNOSIS — Z7984 Long term (current) use of oral hypoglycemic drugs: Secondary | ICD-10-CM | POA: Diagnosis not present

## 2020-06-15 DIAGNOSIS — I10 Essential (primary) hypertension: Secondary | ICD-10-CM | POA: Insufficient documentation

## 2020-06-15 DIAGNOSIS — U071 COVID-19: Secondary | ICD-10-CM | POA: Diagnosis not present

## 2020-06-15 DIAGNOSIS — Z7982 Long term (current) use of aspirin: Secondary | ICD-10-CM | POA: Diagnosis not present

## 2020-06-15 DIAGNOSIS — E039 Hypothyroidism, unspecified: Secondary | ICD-10-CM | POA: Diagnosis not present

## 2020-06-15 DIAGNOSIS — Z79899 Other long term (current) drug therapy: Secondary | ICD-10-CM | POA: Diagnosis not present

## 2020-06-15 DIAGNOSIS — I700 Atherosclerosis of aorta: Secondary | ICD-10-CM | POA: Diagnosis not present

## 2020-06-15 DIAGNOSIS — E119 Type 2 diabetes mellitus without complications: Secondary | ICD-10-CM | POA: Diagnosis not present

## 2020-06-15 DIAGNOSIS — R109 Unspecified abdominal pain: Secondary | ICD-10-CM | POA: Diagnosis not present

## 2020-06-15 DIAGNOSIS — R6883 Chills (without fever): Secondary | ICD-10-CM | POA: Diagnosis not present

## 2020-06-15 DIAGNOSIS — K760 Fatty (change of) liver, not elsewhere classified: Secondary | ICD-10-CM | POA: Diagnosis not present

## 2020-06-15 DIAGNOSIS — K429 Umbilical hernia without obstruction or gangrene: Secondary | ICD-10-CM | POA: Diagnosis not present

## 2020-06-15 LAB — CBC
HCT: 42.2 % (ref 36.0–46.0)
Hemoglobin: 14.1 g/dL (ref 12.0–15.0)
MCH: 29.7 pg (ref 26.0–34.0)
MCHC: 33.4 g/dL (ref 30.0–36.0)
MCV: 88.8 fL (ref 80.0–100.0)
Platelets: 233 10*3/uL (ref 150–400)
RBC: 4.75 MIL/uL (ref 3.87–5.11)
RDW: 12.4 % (ref 11.5–15.5)
WBC: 9.1 10*3/uL (ref 4.0–10.5)
nRBC: 0 % (ref 0.0–0.2)

## 2020-06-15 LAB — COMPREHENSIVE METABOLIC PANEL
ALT: 14 U/L (ref 0–44)
AST: 16 U/L (ref 15–41)
Albumin: 4.2 g/dL (ref 3.5–5.0)
Alkaline Phosphatase: 71 U/L (ref 38–126)
Anion gap: 9 (ref 5–15)
BUN: 10 mg/dL (ref 8–23)
CO2: 22 mmol/L (ref 22–32)
Calcium: 9.3 mg/dL (ref 8.9–10.3)
Chloride: 102 mmol/L (ref 98–111)
Creatinine, Ser: 0.68 mg/dL (ref 0.44–1.00)
GFR, Estimated: >60 mL/min (ref >60)
Glucose, Bld: 124 mg/dL — ABNORMAL HIGH (ref 70–99)
Potassium: 3.8 mmol/L (ref 3.5–5.1)
Sodium: 133 mmol/L — ABNORMAL LOW (ref 135–145)
Total Bilirubin: 0.9 mg/dL (ref 0.3–1.2)
Total Protein: 7.5 g/dL (ref 6.5–8.1)

## 2020-06-15 LAB — URINALYSIS, COMPLETE (UACMP) WITH MICROSCOPIC
Bacteria, UA: NONE SEEN
Bilirubin Urine: NEGATIVE
Glucose, UA: NEGATIVE mg/dL
Ketones, ur: NEGATIVE mg/dL
Leukocytes,Ua: NEGATIVE
Nitrite: NEGATIVE
Protein, ur: NEGATIVE mg/dL
Specific Gravity, Urine: 1.011 (ref 1.005–1.030)
pH: 5 (ref 5.0–8.0)

## 2020-06-15 LAB — LACTIC ACID, PLASMA: Lactic Acid, Venous: 1 mmol/L (ref 0.5–1.9)

## 2020-06-15 LAB — LIPASE, BLOOD: Lipase: 32 U/L (ref 11–51)

## 2020-06-15 LAB — RESP PANEL BY RT-PCR (FLU A&B, COVID) ARPGX2
Influenza A by PCR: NEGATIVE
Influenza B by PCR: NEGATIVE
SARS Coronavirus 2 by RT PCR: POSITIVE — AB

## 2020-06-15 MED ORDER — ACETAMINOPHEN 325 MG PO TABS
650.0000 mg | ORAL_TABLET | Freq: Once | ORAL | Status: AC
  Administered 2020-06-15: 650 mg via ORAL
  Filled 2020-06-15 (×2): qty 2

## 2020-06-15 MED ORDER — SODIUM CHLORIDE 0.9 % IV BOLUS
1000.0000 mL | Freq: Once | INTRAVENOUS | Status: AC
  Administered 2020-06-15: 1000 mL via INTRAVENOUS

## 2020-06-15 MED ORDER — MORPHINE SULFATE (PF) 2 MG/ML IV SOLN
2.0000 mg | Freq: Once | INTRAVENOUS | Status: AC
Start: 2020-06-15 — End: 2020-06-15
  Administered 2020-06-15: 2 mg via INTRAVENOUS
  Filled 2020-06-15: qty 1

## 2020-06-15 MED ORDER — ONDANSETRON 4 MG PO TBDP: 4.0000 mg | ORAL_TABLET | Freq: Three times a day (TID) | ORAL | 0 refills | Status: DC | PRN

## 2020-06-15 MED ORDER — KETOROLAC TROMETHAMINE 30 MG/ML IJ SOLN
15.0000 mg | Freq: Once | INTRAMUSCULAR | Status: AC
  Administered 2020-06-15: 15 mg via INTRAVENOUS
  Filled 2020-06-15: qty 1

## 2020-06-15 MED ORDER — LOPERAMIDE HCL 2 MG PO TABS: 2.0000 mg | ORAL_TABLET | Freq: Four times a day (QID) | ORAL | 0 refills | Status: DC | PRN

## 2020-06-15 MED ORDER — OXYCODONE HCL 5 MG PO TABS: 2.5000 mg | ORAL_TABLET | Freq: Four times a day (QID) | ORAL | 0 refills | Status: DC | PRN

## 2020-06-15 MED ORDER — ONDANSETRON 4 MG PO TBDP
4.0000 mg | ORAL_TABLET | Freq: Once | ORAL | Status: AC
  Administered 2020-06-15: 4 mg via ORAL
  Filled 2020-06-15: qty 1

## 2020-06-15 MED ORDER — NIRMATRELVIR/RITONAVIR (PAXLOVID)TABLET: 3.0000 | ORAL_TABLET | Freq: Two times a day (BID) | ORAL | 0 refills | Status: AC

## 2020-06-15 NOTE — ED Provider Notes (Signed)
[facility] Emergency Department Provider Note  ____________________________________________   Event Date/Time   First MD Initiated Contact with Patient 06/15/20 1757     (approximate)  I have reviewed the triage vital signs and the nursing notes.   HISTORY  Chief Complaint Generalized Body Aches, Chills, and Abdominal Pain  HPI Veronica Branch is a 80 y.o. female with a history of hypertension, hyperglycemia, hypothyroidism and diverticulosis, presents to the ED for evaluation of generalized body aches, chills, and persistent diarrhea.  Patient endorses onset of symptoms last night.  Patient also notes some left-sided abdominal pain and a mild cough. She denies any known sick contacts, but was in attendance at a large gathering over the weekend. She is currently vaccinated & boosted against COVID.  She denies any interim fevers, congestion, or chest pain.     Past Medical History:  Diagnosis Date  . Diverticulosis   . GERD (gastroesophageal reflux disease)   . Hiatal hernia   . History of migraine headaches   . Hypercholesterolemia   . Hyperglycemia   . Hypertension   . Hypothyroidism    s/p removal of right thyroid lobe and isthmus (1992)    Patient Active Problem List   Diagnosis Date Noted  . Urinary urgency 07/31/2019  . Left hip pain 07/14/2019  . Aortic atherosclerosis ([place]) 05/17/2017  . Loose stools 03/21/2017  . Recurrent UTI 01/30/2016  . Abdominal bloating 12/23/2015  . Health care maintenance 06/12/2014  . Vitamin D deficiency 02/09/2014  . Osteopenia 11/25/2011  . History of migraine headaches 11/25/2011  . Hypercholesterolemia 11/25/2011  . Hypertension 11/25/2011  . Diabetes ([place]) 11/25/2011  . Hypothyroidism 11/25/2011    Past Surgical History:  Procedure Laterality Date  . BREAST EXCISIONAL BIOPSY Right 2000   benign  . THYROID LOBECTOMY  1992   s/p removal of right thyroid lobe and isthmus    Prior to Admission  medications   Medication Sig Start Date End Date Taking? Authorizing Provider  loperamide (IMODIUM A-D) 2 MG tablet Take 1 tablet (2 mg total) by mouth 4 (four) times daily as needed for diarrhea or loose stools. 06/15/20  Yes Duffy Bruce, MD  nirmatrelvir/ritonavir EUA (PAXLOVID) TABS Take 3 tablets by mouth 2 (two) times daily for 5 days. Patient GFR is 60+. Take nirmatrelvir (150 mg) two tablets twice daily for 5 days and ritonavir (100 mg) one tablet twice daily for 5 days. 06/15/20 06/20/20 Yes Duffy Bruce, MD  ondansetron (ZOFRAN ODT) 4 MG disintegrating tablet Take 1 tablet (4 mg total) by mouth every 8 (eight) hours as needed for nausea or vomiting. 06/15/20  Yes Duffy Bruce, MD  oxyCODONE (ROXICODONE) 5 MG immediate release tablet Take 0.5 tablets (2.5 mg total) by mouth every 6 (six) hours as needed for severe pain. 06/15/20 06/15/21 Yes Duffy Bruce, MD  ACCU-CHEK GUIDE test strip USE TO TEST BLOOD SUGAR ONCE DAILY 06/28/19   Einar Pheasant, MD  aspirin 81 MG tablet Take 81 mg by mouth daily.    [provider]  blood glucose meter kit and supplies KIT Dispense based on patient and insurance preference. Use to check sugars once daily. Dx E11.9 04/06/19   Einar Pheasant, MD  Lancets [facility] ULTRASOFT) lancets Use to test blood sugars 1-2 times daily   E11.9 03/01/14   Einar Pheasant, MD  lisinopril (ZESTRIL) 10 MG tablet TAKE 1 TABLET BY MOUTH EVERY DAY 10/31/19   Einar Pheasant, MD  metFORMIN (GLUCOPHAGE-XR) 500 MG 24 hr tablet TAKE  1 TABLET BY MOUTH EVERY DAY WITH BREAKFAST 03/19/20   Einar Pheasant, MD  nitrofurantoin, macrocrystal-monohydrate, (MACROBID) 100 MG capsule Take 1 capsule (100 mg total) by mouth 2 (two) times daily. 07/26/19   Einar Pheasant, MD  nystatin cream (MYCOSTATIN) Apply 1 application topically 2 (two) times daily. 05/12/19   Einar Pheasant, MD  omeprazole (PRILOSEC) 20 MG capsule TAKE 1 CAPSULE BY MOUTH EVERY DAY Patient taking differently: as  needed. 07/05/19   Einar Pheasant, MD  polyethylene glycol (MIRALAX / GLYCOLAX) packet Take 17 g by mouth daily. Mix one tablespoon with 8oz of your favorite juice or water every day until you are having soft formed stools. Then start taking once daily if you didn't have a stool the day before. Patient taking differently: Take 17 g by mouth as needed. Mix one tablespoon with 8oz of your favorite juice or water every day until you are having soft formed stools. Then start taking once daily if you didn't have a stool the day before. 03/12/17   Merlyn Lot, MD  Probiotic Product (PROBIOTIC PO) Take 1 tablet by mouth daily.    [provider]  propranolol (INDERAL) 40 MG tablet TAKE 1 TABLET BY MOUTH TWICE A DAY 10/31/19   Einar Pheasant, MD  SYNTHROID 100 MCG tablet TAKE 1 TABLET BY MOUTH EVERY DAY 11/14/19   Einar Pheasant, MD  Blood Glucose Monitoring Suppl (ONE TOUCH ULTRA SYSTEM KIT) W/DEVICE KIT 1 kit by Does not apply route once. 03/01/14 04/06/19  Einar Pheasant, MD    Allergies Ciprofloxacin hcl, Penicillins, and Sulfa antibiotics  Family History  Problem Relation Age of Onset  . Coronary artery disease Mother        myocardial infarction and CHF  . Breast cancer Mother        12's  . Breast cancer Maternal Aunt        x 2  . Breast cancer Sister        28's  . Breast cancer Cousin        maternal side  . Breast cancer Other 51  . Colon cancer Neg Hx     Social History Social History   Tobacco Use  . Smoking status: Never Smoker  . Smokeless tobacco: Never Used  Substance Use Topics  . Alcohol use: No    Alcohol/week: 0.0 standard drinks  . Drug use: No    Review of Systems  Constitutional: Unaware of fever/reports chills Eyes: No visual changes. ENT: No sore throat. Cardiovascular: Denies chest pain. Respiratory: Denies shortness of breath. Gastrointestinal: No abdominal pain.  No nausea, no vomiting.  Reports diarrhea.  No  constipation. Genitourinary: Negative for dysuria. Musculoskeletal: Negative for back pain. Skin: Negative for rash. Neurological: Negative for headaches, focal weakness or numbness. ____________________________________________   PHYSICAL EXAM:  VITAL SIGNS: ED Triage Vitals [06/15/20 1617]  Enc Vitals Group     BP (!) 172/77     Pulse Rate 76     Resp 20     Temp 100.1 F (37.8 C)     Temp Source Oral     SpO2 96 %     Weight 170 lb (77.1 kg)     Height 5' (1.524 m)     Head Circumference      Peak Flow      Pain Score 5     Pain Loc      Pain Edu?      Excl. in [place]?     Constitutional: Alert and  oriented. Well appearing and in no acute distress. Eyes: Conjunctivae are normal. PERRL. EOMI. Head: Atraumatic. Nose: No congestion/rhinnorhea. Mouth/Throat: Mucous membranes are moist.  Oropharynx non-erythematous. Neck: No stridor.   Cardiovascular: Normal rate, regular rhythm. Grossly normal heart sounds.  Good peripheral circulation. Respiratory: Normal respiratory effort.  No retractions. Lungs CTAB. Gastrointestinal: Soft and mildly tender to palp over the left upper/lower abdomen. No distention. No abdominal bruits. No CVA tenderness. Musculoskeletal: No lower extremity tenderness nor edema.  No joint effusions. Neurologic:  Normal speech and language. No gross focal neurologic deficits are appreciated. No gait instability. Skin:  Skin is warm, dry and intact. No rash noted. Psychiatric: Mood and affect are normal. Speech and behavior are normal. ____________________________________________   LABS (all labs ordered are listed, but only abnormal results are displayed)  Labs Reviewed  RESP PANEL BY RT-PCR (FLU A&B, COVID) ARPGX2 - Abnormal; Notable for the following components:      Result Value   SARS Coronavirus 2 by RT PCR POSITIVE (*)    All other components within normal limits  COMPREHENSIVE METABOLIC PANEL - Abnormal; Notable for the following components:    Sodium 133 (*)    Glucose, Bld 124 (*)    All other components within normal limits  URINALYSIS, COMPLETE (UACMP) WITH MICROSCOPIC - Abnormal; Notable for the following components:   Color, Urine YELLOW (*)    APPearance CLEAR (*)    Hgb urine dipstick LARGE (*)    All other components within normal limits  LIPASE, BLOOD  CBC  LACTIC ACID, PLASMA   ____________________________________________  EKG  Vent. rate 72 BPM PR interval 158 ms QRS duration 68 ms QT/QTcB 358/392 ms P-R-T axes 24 -7 7 No STEMI ____________________________________________  RADIOLOGY I, Melvenia Needles, personally viewed and evaluated these images (plain radiographs) as part of my medical decision making, as well as reviewing the written report by the radiologist.  ED MD interpretation:  Agree with report  Official radiology report(s): CT ABDOMEN PELVIS WO CONTRAST  Result Date: 06/15/2020 CLINICAL DATA:  80 year old female with abdominal pain. Concern for acute diverticulitis. EXAM: CT ABDOMEN AND PELVIS WITHOUT CONTRAST TECHNIQUE: Multidetector CT imaging of the abdomen and pelvis was performed following the standard protocol without IV contrast. COMPARISON:  CT abdomen pelvis dated 03/12/2017. FINDINGS: Evaluation of this exam is limited in the absence of intravenous contrast. Lower chest: Left lung base linear atelectasis/scarring. No intra-abdominal free air or free fluid. Hepatobiliary: There is fatty infiltration of the liver. No intrahepatic biliary dilatation. The gallbladder is unremarkable. Pancreas: Unremarkable. No pancreatic ductal dilatation or surrounding inflammatory changes. Spleen: Normal in size without focal abnormality. Adrenals/Urinary Tract: The adrenal glands unremarkable. The kidneys, visualized ureters, and urinary bladder appear unremarkable. Stomach/Bowel: There is distal colonic diverticulosis. No active inflammatory changes. There is no bowel obstruction or active  inflammation. The appendix is normal. Vascular/Lymphatic: Mild aortoiliac atherosclerotic disease. The IVC is unremarkable. No portal venous gas. There is no adenopathy. Reproductive: The uterus is anteverted and grossly unremarkable. No adnexal masses. Other: Small fat containing supraumbilical hernia. No inflammatory changes or fluid collection. No bowel herniation. Musculoskeletal: No acute osseous pathology. IMPRESSION: 1. No acute intra-abdominal or pelvic pathology. 2. Colonic diverticulosis. No bowel obstruction. Normal appendix. 3. Fatty liver. 4. Aortic Atherosclerosis (ICD10-I70.0). Electronically Signed   By: Anner Crete M.D.   On: 06/15/2020 19:38   DG Chest Portable 1 View  Result Date: 06/15/2020 CLINICAL DATA:  Coughing congestion, body aches, chills EXAM: PORTABLE CHEST 1 VIEW COMPARISON:  None. FINDINGS: Enlarged cardiac silhouette. Mild bronchial wall thickening and interstitial prominence. There is no focal airspace consolidation. There is no large pleural effusion or visible pneumothorax. There is no acute osseous abnormality. IMPRESSION: Mild bronchial wall thickening and interstitial prominence which can be seen in bronchitis. No focal airspace consolidation. Electronically Signed   By: Maurine Simmering   On: 06/15/2020 18:46  ____________________________________________   PROCEDURES  Procedure(s) performed (including Critical Care):  Procedures  Ketorolac 30 mg IVP Morphine 2 mg IVP Normal saline 1000 mL bolus Ondansetron 4 mg ODT Tylenol 650 mg p.o. ____________________________________________   INITIAL IMPRESSION / ASSESSMENT AND PLAN / ED COURSE  As part of my medical decision making, I reviewed the following data within the [place] reviewed as above, Radiograph reviewed WNL and Notes from prior ED visits   Differential diagnosis includes, but is not limited to acute appendicitis, diverticulitis, urinary tract infection/pyelonephritis,  endometriosis, bowel obstruction, colitis, renal colic, gastroenteritis, hernia, fibroids, endometriosis, etc.  Patient with ED evaluation of left lower abdominal pain with diarrhea.  She complains of body aches, chills and diarrhea.  She was evaluated for her symptoms and was found to be COVID-positive.  Chest x-ray and CT of the abdomen pelvis were negative and reassuring at this time.  No indication of any acute diverticulitis.  Patient is reassured but disappointed by diagnosis.  She believes fully that her point of contact was the large gathering she attended over the week and where no one, including herself.  She otherwise is stable at this time for discharge home.  She is discharged with prescriptions for oxycodone and Paxlovid to take as directed.  She will follow-up with primary provider or return to the ED if necessary.  Return precautions have been discussed. ____________________________________________   FINAL CLINICAL IMPRESSION(S) / ED DIAGNOSES  Final diagnoses:  [id number]     ED Discharge Orders         Ordered    nirmatrelvir/ritonavir EUA (PAXLOVID) TABS  2 times daily        06/15/20 2223    ondansetron (ZOFRAN ODT) 4 MG disintegrating tablet  Every 8 hours PRN        06/15/20 2223    loperamide (IMODIUM A-D) 2 MG tablet  4 times daily PRN        06/15/20 2223    oxyCODONE (ROXICODONE) 5 MG immediate release tablet  Every 6 hours PRN        06/15/20 2223          *Please note:  Veronica Branch was evaluated in Emergency Department on 06/16/2020 for the symptoms described in the history of present illness. She was evaluated in the context of the global COVID-19 pandemic, which necessitated consideration that the patient might be at risk for infection with the SARS-CoV-2 virus that causes COVID-19. Institutional protocols and algorithms that pertain to the evaluation of patients at risk for COVID-19 are in a state of rapid change based on information released by  regulatory bodies including the CDC and federal and state organizations. These policies and algorithms were followed during the patient's care in the ED.  Some ED evaluations and interventions may be delayed as a result of limited staffing during and the pandemic.*   Note:  This document was prepared using Dragon voice recognition software and may include unintentional dictation errors.    Melvenia Needles, PA-C 06/16/20 1843    Duffy Bruce, MD 06/21/20 [phone]

## 2020-06-15 NOTE — ED Triage Notes (Signed)
Pt reports bodyaches, chills and diarrhea every time she moves. Pt reports also some pain in her abd. Pt states sx's started last pm.

## 2020-06-15 NOTE — Discharge Instructions (Addendum)
Take TYLENOL 1000 mg every 4-6 hours for mild pain.  Take the OXYCODONE half tablet for severe pain. Do not drive while taking this. Take the IMODIUM for diarrhea Take the PAXLOVID for the COVID-19 Drink plenty of fluids

## 2020-06-19 ENCOUNTER — Telehealth: Payer: Self-pay | Admitting: Internal Medicine

## 2020-06-19 NOTE — Telephone Encounter (Signed)
PT called in regards to herself and her husband to advise that both have tested positive and were advise to contact their doctor to find out what to do and what to take by the ER. She would like to be contacted asap by either her home phone or by the mobile phone for her Husband

## 2020-06-19 NOTE — Telephone Encounter (Signed)
Pt on paxlovid. Doing ok. Scheduled husband for virtual with NP tomorrow to discuss antiviral tx

## 2020-07-03 DIAGNOSIS — H18413 Arcus senilis, bilateral: Secondary | ICD-10-CM | POA: Diagnosis not present

## 2020-07-03 DIAGNOSIS — H2512 Age-related nuclear cataract, left eye: Secondary | ICD-10-CM | POA: Diagnosis not present

## 2020-07-03 DIAGNOSIS — H2513 Age-related nuclear cataract, bilateral: Secondary | ICD-10-CM | POA: Diagnosis not present

## 2020-07-03 DIAGNOSIS — H25043 Posterior subcapsular polar age-related cataract, bilateral: Secondary | ICD-10-CM | POA: Diagnosis not present

## 2020-07-03 DIAGNOSIS — H25013 Cortical age-related cataract, bilateral: Secondary | ICD-10-CM | POA: Diagnosis not present

## 2020-07-09 ENCOUNTER — Encounter: Payer: Self-pay | Admitting: Internal Medicine

## 2020-07-19 ENCOUNTER — Telehealth: Payer: Self-pay | Admitting: *Deleted

## 2020-07-19 DIAGNOSIS — E7800 Pure hypercholesterolemia, unspecified: Secondary | ICD-10-CM

## 2020-07-19 DIAGNOSIS — R319 Hematuria, unspecified: Secondary | ICD-10-CM

## 2020-07-19 DIAGNOSIS — I10 Essential (primary) hypertension: Secondary | ICD-10-CM

## 2020-07-19 DIAGNOSIS — E1165 Type 2 diabetes mellitus with hyperglycemia: Secondary | ICD-10-CM

## 2020-07-19 NOTE — Telephone Encounter (Signed)
Please place future orders for lab appt.  

## 2020-07-20 DIAGNOSIS — R319 Hematuria, unspecified: Secondary | ICD-10-CM | POA: Insufficient documentation

## 2020-07-20 NOTE — Telephone Encounter (Signed)
Orders placed for labs

## 2020-07-24 ENCOUNTER — Other Ambulatory Visit: Payer: Self-pay

## 2020-07-24 ENCOUNTER — Other Ambulatory Visit (INDEPENDENT_AMBULATORY_CARE_PROVIDER_SITE_OTHER): Payer: Medicare PPO

## 2020-07-24 DIAGNOSIS — E1165 Type 2 diabetes mellitus with hyperglycemia: Secondary | ICD-10-CM | POA: Diagnosis not present

## 2020-07-24 DIAGNOSIS — I10 Essential (primary) hypertension: Secondary | ICD-10-CM | POA: Diagnosis not present

## 2020-07-24 DIAGNOSIS — E7800 Pure hypercholesterolemia, unspecified: Secondary | ICD-10-CM | POA: Diagnosis not present

## 2020-07-24 DIAGNOSIS — R319 Hematuria, unspecified: Secondary | ICD-10-CM

## 2020-07-24 LAB — URINALYSIS, ROUTINE W REFLEX MICROSCOPIC
Bilirubin Urine: NEGATIVE
Ketones, ur: NEGATIVE
Nitrite: NEGATIVE
Specific Gravity, Urine: 1.015 (ref 1.000–1.030)
Total Protein, Urine: NEGATIVE
Urine Glucose: NEGATIVE
Urobilinogen, UA: 0.2 (ref 0.0–1.0)
pH: 6 (ref 5.0–8.0)

## 2020-07-24 LAB — HEPATIC FUNCTION PANEL
ALT: 14 U/L (ref 0–35)
AST: 12 U/L (ref 0–37)
Albumin: 4.1 g/dL (ref 3.5–5.2)
Alkaline Phosphatase: 72 U/L (ref 39–117)
Bilirubin, Direct: 0.1 mg/dL (ref 0.0–0.3)
Total Bilirubin: 0.6 mg/dL (ref 0.2–1.2)
Total Protein: 6.4 g/dL (ref 6.0–8.3)

## 2020-07-24 LAB — BASIC METABOLIC PANEL
BUN: 9 mg/dL (ref 6–23)
CO2: 28 mEq/L (ref 19–32)
Calcium: 9.2 mg/dL (ref 8.4–10.5)
Chloride: 107 mEq/L (ref 96–112)
Creatinine, Ser: 0.66 mg/dL (ref 0.40–1.20)
GFR: 83.82 mL/min (ref >60.00)
Glucose, Bld: 121 mg/dL — ABNORMAL HIGH (ref 70–99)
Potassium: 4.1 mEq/L (ref 3.5–5.1)
Sodium: 142 mEq/L (ref 135–145)

## 2020-07-24 LAB — LIPID PANEL
Cholesterol: 181 mg/dL (ref 0–200)
HDL: 43.2 mg/dL (ref >39.00)
LDL Cholesterol: 111 mg/dL — ABNORMAL HIGH (ref 0–99)
NonHDL: 137.31
Total CHOL/HDL Ratio: 4
Triglycerides: 134 mg/dL (ref 0.0–149.0)
VLDL: 26.8 mg/dL (ref 0.0–40.0)

## 2020-07-24 LAB — HEMOGLOBIN A1C: Hgb A1c MFr Bld: 7 % — ABNORMAL HIGH (ref 4.6–6.5)

## 2020-07-25 ENCOUNTER — Encounter: Payer: Self-pay | Admitting: Internal Medicine

## 2020-07-25 ENCOUNTER — Ambulatory Visit: Payer: Medicare PPO | Admitting: Internal Medicine

## 2020-07-25 DIAGNOSIS — R319 Hematuria, unspecified: Secondary | ICD-10-CM | POA: Diagnosis not present

## 2020-07-25 DIAGNOSIS — R195 Other fecal abnormalities: Secondary | ICD-10-CM

## 2020-07-25 DIAGNOSIS — I10 Essential (primary) hypertension: Secondary | ICD-10-CM

## 2020-07-25 DIAGNOSIS — E7800 Pure hypercholesterolemia, unspecified: Secondary | ICD-10-CM

## 2020-07-25 DIAGNOSIS — R059 Cough, unspecified: Secondary | ICD-10-CM | POA: Diagnosis not present

## 2020-07-25 DIAGNOSIS — I700 Atherosclerosis of aorta: Secondary | ICD-10-CM

## 2020-07-25 DIAGNOSIS — E039 Hypothyroidism, unspecified: Secondary | ICD-10-CM | POA: Diagnosis not present

## 2020-07-25 DIAGNOSIS — Z01818 Encounter for other preprocedural examination: Secondary | ICD-10-CM

## 2020-07-25 DIAGNOSIS — E1165 Type 2 diabetes mellitus with hyperglycemia: Secondary | ICD-10-CM | POA: Diagnosis not present

## 2020-07-25 MED ORDER — TELMISARTAN 20 MG PO TABS: 20.0000 mg | ORAL_TABLET | Freq: Every day | ORAL | 2 refills | Status: DC

## 2020-07-25 NOTE — Progress Notes (Signed)
Patient ID: Veronica Branch, female   DOB: [birth year], 80 y.o.   MRN: [id number]   Subjective:    Patient ID: Veronica Branch, female    DOB: 07/21/1940, 80 y.o.   MRN: [id number]  HPI This visit occurred during the SARS-CoV-2 public health emergency.  Safety protocols were in place, including screening questions prior to the visit, additional usage of staff PPE, and extensive cleaning of exam room while observing appropriate contact time as indicated for disinfecting solutions.   Patient here for a follow up . Here to follow up regarding her diabetes, hypercholesterolemia and hypertension.  She recently had covid.  Was seen in ER 06/15/20 with body aches, chills and diarrhea.  She was concerned regarding possible diverticulitis. CT - fatty liver, no acute intra-abdominal or pelvic pathology, colonic diverticulosis. CXR -mild bronchial wall thickening and interstitial prominence which can be seen in bronchitis.  No focal airspace consolidation.  Found to be COVID-positive.  Discharged on Paxlovid and oxycodone.  She is feeling better.  She is still having some intermittent bowel issues.  This was an issue prior to this most recent acute episode.  She reports having persistent intermittent loose stools.  Had a couple days recently where she did not have a bowel movement.  Subsequently took [place].  States she uses MiraLAX on an as-needed basis.  For the most part, it appears that she is still having loose stools.  She does take a probiotic daily.  We discussed adding fiber.  We will hold MiraLAX since she is having more loose stools.  Recently experienced some right groin pain.  After having an episode of vomiting and diarrhea this all resolved.  This has not been a persistent issue.  She is eating.  No nausea or vomiting.  No increased abdominal pain.  We discussed the need for GI follow-up given the persistent issue with loose stool.  CT scan unrevealing of an etiology.  She also reports persistent cough  with some associated congestion.  States she really did not have respiratory symptoms with her COVID.  The cough has been an issue for a while.  Discussed chest x-ray findings.  Discussed further pulmonary work-up.  She was questioned if her medication could be contributing.  She is on lisinopril.  Discussed stopping this to see if her cough improved.  No chest pain reported.  Is not active.  Does not do a lot of physical activity.  May get little winded with exertion.  She is planning for surgery next week.  Had EKG in the emergency room.  Some nonspecific changes noted.  Discussed cardiac evaluation for any preop recommendations.  She was in agreement.  She is not checking her sugars.  Not following her diet.  Past Medical History:  Diagnosis Date   Diverticulosis    GERD (gastroesophageal reflux disease)    Hiatal hernia    History of migraine headaches    Hypercholesterolemia    Hyperglycemia    Hypertension    Hypothyroidism    s/p removal of right thyroid lobe and isthmus (1992)   Past Surgical History:  Procedure Laterality Date   BREAST EXCISIONAL BIOPSY Right 2000   benign   THYROID LOBECTOMY  1992   s/p removal of right thyroid lobe and isthmus   Family History  Problem Relation Age of Onset   Coronary artery disease Mother        myocardial infarction and CHF   Breast cancer Mother  53's   Breast cancer Maternal Aunt        x 2   Breast cancer Sister        20's   Breast cancer Cousin        maternal side   Breast cancer Other 5   Colon cancer Neg Hx    Social History   Socioeconomic History   Marital status: Married    Spouse name: Not on file   Number of children: 2   Years of education: Not on file   Highest education level: Not on file  Occupational History   Occupation: retired Pharmacist, hospital  Tobacco Use   Smoking status: Never   Smokeless tobacco: Never  Substance and Sexual Activity   Alcohol use: No    Alcohol/week: 0.0 standard drinks   Drug  use: No   Sexual activity: Yes  Other Topics Concern   Not on file  Social History Narrative   Regularly exercises. Retired and married.    Social Determinants of Health   Financial Resource Strain: Not on file  Food Insecurity: Not on file  Transportation Needs: Not on file  Physical Activity: Not on file  Stress: Not on file  Social Connections: Not on file    Review of Systems  Constitutional:  Negative for appetite change and unexpected weight change.  HENT:  Negative for congestion and sinus pressure.   Respiratory:  Positive for cough. Negative for chest tightness and wheezing.   Cardiovascular:  Negative for chest pain, palpitations and leg swelling.  Gastrointestinal:  Negative for nausea and vomiting.       Loose stools as outlined.    Genitourinary:  Negative for difficulty urinating and dysuria.  Musculoskeletal:  Negative for joint swelling and myalgias.  Skin:  Negative for color change and rash.  Neurological:  Negative for dizziness, light-headedness and headaches.  Psychiatric/Behavioral:  Negative for agitation and dysphoric mood.       Objective:    Physical Exam Vitals reviewed.  Constitutional:      General: She is not in acute distress.    Appearance: Normal appearance.  HENT:     Head: Normocephalic and atraumatic.     Right Ear: External ear normal.     Left Ear: External ear normal.  Eyes:     General: No scleral icterus.       Right eye: No discharge.        Left eye: No discharge.     Conjunctiva/sclera: Conjunctivae normal.  Neck:     Thyroid: No thyromegaly.  Cardiovascular:     Rate and Rhythm: Normal rate and regular rhythm.  Pulmonary:     Effort: No respiratory distress.     Breath sounds: Normal breath sounds. No wheezing.  Abdominal:     General: Bowel sounds are normal.     Palpations: Abdomen is soft.     Tenderness: There is no abdominal tenderness.  Musculoskeletal:        General: No swelling or tenderness.      Cervical back: Neck supple. No tenderness.  Lymphadenopathy:     Cervical: No cervical adenopathy.  Skin:    Findings: No erythema or rash.  Neurological:     Mental Status: She is alert.  Psychiatric:        Mood and Affect: Mood normal.        Behavior: Behavior normal.    BP 130/78   Pulse (!) 57   Temp 97.9 F (36.6 C)  Ht 5' (1.524 m)   Wt 175 lb 9.6 oz (79.7 kg)   SpO2 96%   BMI 34.29 kg/m  Wt Readings from Last 3 Encounters:  07/25/20 175 lb 9.6 oz (79.7 kg)  06/15/20 170 lb (77.1 kg)  03/26/20 178 lb (80.7 kg)    Outpatient Encounter Medications as of 07/25/2020  Medication Sig   ACCU-CHEK GUIDE test strip USE TO TEST BLOOD SUGAR ONCE DAILY   aspirin 81 MG tablet Take 81 mg by mouth daily.   blood glucose meter kit and supplies KIT Dispense based on patient and insurance preference. Use to check sugars once daily. Dx E11.9   Lancets (ONETOUCH ULTRASOFT) lancets Use to test blood sugars 1-2 times daily   E11.9   loperamide (IMODIUM A-D) 2 MG tablet Take 1 tablet (2 mg total) by mouth 4 (four) times daily as needed for diarrhea or loose stools.   metFORMIN (GLUCOPHAGE-XR) 500 MG 24 hr tablet TAKE 1 TABLET BY MOUTH EVERY DAY WITH BREAKFAST   nitrofurantoin, macrocrystal-monohydrate, (MACROBID) 100 MG capsule Take 1 capsule (100 mg total) by mouth 2 (two) times daily.   nystatin cream (MYCOSTATIN) Apply 1 application topically 2 (two) times daily.   omeprazole (PRILOSEC) 20 MG capsule TAKE 1 CAPSULE BY MOUTH EVERY DAY (Patient taking differently: as needed.)   ondansetron (ZOFRAN ODT) 4 MG disintegrating tablet Take 1 tablet (4 mg total) by mouth every 8 (eight) hours as needed for nausea or vomiting.   polyethylene glycol (MIRALAX / GLYCOLAX) packet Take 17 g by mouth daily. Mix one tablespoon with 8oz of your favorite juice or water every day until you are having soft formed stools. Then start taking once daily if you didn't have a stool the day before. (Patient taking  differently: Take 17 g by mouth as needed. Mix one tablespoon with 8oz of your favorite juice or water every day until you are having soft formed stools. Then start taking once daily if you didn't have a stool the day before.)   Probiotic Product (PROBIOTIC PO) Take 1 tablet by mouth daily.   propranolol (INDERAL) 40 MG tablet TAKE 1 TABLET BY MOUTH TWICE A DAY   SYNTHROID 100 MCG tablet TAKE 1 TABLET BY MOUTH EVERY DAY   telmisartan (MICARDIS) 20 MG tablet Take 1 tablet (20 mg total) by mouth daily.   [DISCONTINUED] lisinopril (ZESTRIL) 10 MG tablet TAKE 1 TABLET BY MOUTH EVERY DAY   [DISCONTINUED] Blood Glucose Monitoring Suppl (ONE TOUCH ULTRA SYSTEM KIT) W/DEVICE KIT 1 kit by Does not apply route once.   [DISCONTINUED] oxyCODONE (ROXICODONE) 5 MG immediate release tablet Take 0.5 tablets (2.5 mg total) by mouth every 6 (six) hours as needed for severe pain. (Patient not taking: Reported on 07/25/2020)   No facility-administered encounter medications on file as of 07/25/2020.     Lab Results  Component Value Date   WBC 9.1 06/15/2020   HGB 14.1 06/15/2020   HCT 42.2 06/15/2020   PLT 233 06/15/2020   GLUCOSE 121 (H) 07/24/2020   CHOL 181 07/24/2020   TRIG 134.0 07/24/2020   HDL 43.20 07/24/2020   LDLDIRECT 156.0 12/06/2012   LDLCALC 111 (H) 07/24/2020   ALT 14 07/24/2020   AST 12 07/24/2020   NA 142 07/24/2020   K 4.1 07/24/2020   CL 107 07/24/2020   CREATININE 0.66 07/24/2020   BUN 9 07/24/2020   CO2 28 07/24/2020   TSH 0.44 04/02/2020   HGBA1C 7.0 (H) 07/24/2020   MICROALBUR 1.0 04/02/2020  CT ABDOMEN PELVIS WO CONTRAST  Result Date: 06/15/2020 CLINICAL DATA:  80 year old female with abdominal pain. Concern for acute diverticulitis. EXAM: CT ABDOMEN AND PELVIS WITHOUT CONTRAST TECHNIQUE: Multidetector CT imaging of the abdomen and pelvis was performed following the standard protocol without IV contrast. COMPARISON:  CT abdomen pelvis dated 03/12/2017. FINDINGS: Evaluation of  this exam is limited in the absence of intravenous contrast. Lower chest: Left lung base linear atelectasis/scarring. No intra-abdominal free air or free fluid. Hepatobiliary: There is fatty infiltration of the liver. No intrahepatic biliary dilatation. The gallbladder is unremarkable. Pancreas: Unremarkable. No pancreatic ductal dilatation or surrounding inflammatory changes. Spleen: Normal in size without focal abnormality. Adrenals/Urinary Tract: The adrenal glands unremarkable. The kidneys, visualized ureters, and urinary bladder appear unremarkable. Stomach/Bowel: There is distal colonic diverticulosis. No active inflammatory changes. There is no bowel obstruction or active inflammation. The appendix is normal. Vascular/Lymphatic: Mild aortoiliac atherosclerotic disease. The IVC is unremarkable. No portal venous gas. There is no adenopathy. Reproductive: The uterus is anteverted and grossly unremarkable. No adnexal masses. Other: Small fat containing supraumbilical hernia. No inflammatory changes or fluid collection. No bowel herniation. Musculoskeletal: No acute osseous pathology. IMPRESSION: 1. No acute intra-abdominal or pelvic pathology. 2. Colonic diverticulosis. No bowel obstruction. Normal appendix. 3. Fatty liver. 4. Aortic Atherosclerosis (ICD10-I70.0). Electronically Signed   By: Anner Crete M.D.   On: 06/15/2020 19:38   DG Chest Portable 1 View  Result Date: 06/15/2020 CLINICAL DATA:  Coughing congestion, body aches, chills EXAM: PORTABLE CHEST 1 VIEW COMPARISON:  None. FINDINGS: Enlarged cardiac silhouette. Mild bronchial wall thickening and interstitial prominence. There is no focal airspace consolidation. There is no large pleural effusion or visible pneumothorax. There is no acute osseous abnormality. IMPRESSION: Mild bronchial wall thickening and interstitial prominence which can be seen in bronchitis. No focal airspace consolidation. Electronically Signed   By: Maurine Simmering   On:  06/15/2020 18:46       Assessment & Plan:   Problem List Items Addressed This Visit     Aortic atherosclerosis ([place])    Discussed findings on CT scan.  Is off crestor.  Discussed restarting statin. Wants to hold on restarting.         Relevant Medications   telmisartan (MICARDIS) 20 MG tablet   Cough    Discussed recent cxr.  Cough has been an issue prior to covid.  Will stop lisinopril.  Start micardis as outlined.  Follow. Discussed further w/up and evaluation including PFTs, f/u scanning, etc.  Will start with above.  F/u soon.  Call with update.  If persistent cough, will require further w/up.        Diabetes ([place])    Continues on metformin.  a1c elevated - above goal.  a1c 7.0.  Discussed metformin and persistent loose stool.  She is on the extended release form.  Desires not to change.  Discussed additional medication - to obtain better blood sugar control.  Desires not to add any new medication at this time.  Wants to work on diet and exercise.  Low carb diet. Follow met b and a1c.        Relevant Medications   telmisartan (MICARDIS) 20 MG tablet   Hematuria    Rbc's noted on recent urine.  Will recheck urinalysis to confirm persistent.  If persistent, will require further w/up.  Recent CT abd/pelvis - unrevealing.         Hypercholesterolemia    Not on a statin.  Discussed given history of diabetes,  aortic atherosclerosis noted on CT - recommendation for statin medication.  She declines at this time.  Low cholesterol diet and exercise.  Follow lipid panel.        Relevant Medications   telmisartan (MICARDIS) 20 MG tablet   Hypertension    Blood pressure is well controlled.  Recheck by me 124/78.  Given persistent cough, will change lisinopril to micardis 64m q day.  Follow pressures.  Follow metabolic panel.         Relevant Medications   telmisartan (MICARDIS) 20 MG tablet   Hypothyroidism    On thyroid replacement.  Follow tsh.        Loose stools     Persistent issue.  Recent CT - no acute abnormality. Previous pain - resolved.  Continues on probiotic.  Discussed adding fiber.  Hold miralax.  Discussed further GI evaluation.  Last colonoscopy 2014.  Agreeable for referral.        Relevant Orders   Ambulatory referral to Gastroenterology   Pre-op evaluation    Planning for surgery next week.  Just had EKG in ER - non specific ST/T changes.  Not physically active.  No chest pain reported.  Question of sob with exertion - decreased stamina.  Given above, discussed cardiology evaluation prior to surgery.  Pt agreeable.         Relevant Orders   Ambulatory referral to Cardiology    I spent more than 45 minutes with the patient and more than 50% of the time was spent in consultation regarding the above.  Time spent discussing her current concerns and symptoms.  Time also spent discussing further treatment, testing and w/up.   CEinar Pheasant MD

## 2020-07-26 ENCOUNTER — Encounter: Payer: Self-pay | Admitting: Internal Medicine

## 2020-07-26 ENCOUNTER — Ambulatory Visit: Payer: Medicare PPO | Admitting: Internal Medicine

## 2020-07-26 DIAGNOSIS — Z01818 Encounter for other preprocedural examination: Secondary | ICD-10-CM | POA: Insufficient documentation

## 2020-07-26 DIAGNOSIS — R059 Cough, unspecified: Secondary | ICD-10-CM | POA: Insufficient documentation

## 2020-07-26 DIAGNOSIS — R053 Chronic cough: Secondary | ICD-10-CM | POA: Insufficient documentation

## 2020-07-26 NOTE — Assessment & Plan Note (Signed)
Blood pressure is well controlled.  Recheck by me 124/78.  Given persistent cough, will change lisinopril to micardis 20mg  q day.  Follow pressures.  Follow metabolic panel.

## 2020-07-26 NOTE — Assessment & Plan Note (Signed)
Discussed recent cxr.  Cough has been an issue prior to covid.  Will stop lisinopril.  Start micardis as outlined.  Follow. Discussed further w/up and evaluation including PFTs, f/u scanning, etc.  Will start with above.  F/u soon.  Call with update.  If persistent cough, will require further w/up.

## 2020-07-26 NOTE — Assessment & Plan Note (Signed)
On thyroid replacement.  Follow tsh.  

## 2020-07-26 NOTE — Assessment & Plan Note (Signed)
Discussed findings on CT scan.  Is off crestor.  Discussed restarting statin. Wants to hold on restarting.

## 2020-07-26 NOTE — Assessment & Plan Note (Signed)
Continues on metformin.  a1c elevated - above goal.  a1c 7.0.  Discussed metformin and persistent loose stool.  She is on the extended release form.  Desires not to change.  Discussed additional medication - to obtain better blood sugar control.  Desires not to add any new medication at this time.  Wants to work on diet and exercise.  Low carb diet. Follow met b and a1c.

## 2020-07-26 NOTE — Assessment & Plan Note (Signed)
Planning for surgery next week.  Just had EKG in ER - non specific ST/T changes.  Not physically active.  No chest pain reported.  Question of sob with exertion - decreased stamina.  Given above, discussed cardiology evaluation prior to surgery.  Pt agreeable.

## 2020-07-26 NOTE — Assessment & Plan Note (Signed)
Persistent issue.  Recent CT - no acute abnormality. Previous pain - resolved.  Continues on probiotic.  Discussed adding fiber.  Hold miralax.  Discussed further GI evaluation.  Last colonoscopy 2014.  Agreeable for referral.

## 2020-07-26 NOTE — Assessment & Plan Note (Signed)
Rbc's noted on recent urine.  Will recheck urinalysis to confirm persistent.  If persistent, will require further w/up.  Recent CT abd/pelvis - unrevealing.

## 2020-07-26 NOTE — Assessment & Plan Note (Signed)
Not on a statin.  Discussed given history of diabetes, aortic atherosclerosis noted on CT - recommendation for statin medication.  She declines at this time.  Low cholesterol diet and exercise.  Follow lipid panel.

## 2020-07-27 ENCOUNTER — Encounter: Payer: Self-pay | Admitting: Cardiology

## 2020-07-27 ENCOUNTER — Ambulatory Visit: Payer: Medicare PPO | Admitting: Cardiology

## 2020-07-27 ENCOUNTER — Other Ambulatory Visit: Payer: Self-pay

## 2020-07-27 VITALS — BP 116/68 | HR 55 | Ht 60.0 in | Wt 175.0 lb

## 2020-07-27 DIAGNOSIS — Z01818 Encounter for other preprocedural examination: Secondary | ICD-10-CM | POA: Diagnosis not present

## 2020-07-27 DIAGNOSIS — E7800 Pure hypercholesterolemia, unspecified: Secondary | ICD-10-CM | POA: Diagnosis not present

## 2020-07-27 DIAGNOSIS — I10 Essential (primary) hypertension: Secondary | ICD-10-CM

## 2020-07-27 NOTE — Patient Instructions (Signed)

## 2020-07-27 NOTE — Progress Notes (Signed)
Cardiology Office Note:    Date:  07/27/2020   ID:  NHYLA NAPPI, DOB 1940/07/11, MRN [id number]  PCP:  Einar Pheasant, MD   [facility] Providers Cardiologist:  None     Referring MD: Einar Pheasant, MD   Chief Complaint  Patient presents with   Other    Pre op clearance for eye surgery. Meds reviewed verbally with patient.     History of Present Illness:    Veronica Branch is a 80 y.o. female with a hx of hypertension, hyperlipidemia, diabetes, who presents for preop evaluation prior to cataract surgery.  She has cataract surgery planned for next week.  Saw her primary care provider for preop evaluation, EKG showed nonspecific ST-T changes.  She denies any history of heart disease.  Denies chest pain, shortness of breath, palpitations.  States feeling well.  Had a stomach virus 4/diarrhea days ago lasting 3 days which is improved.  Has no other concerns at this time.  Past Medical History:  Diagnosis Date   Diverticulosis    GERD (gastroesophageal reflux disease)    Hiatal hernia    History of migraine headaches    Hypercholesterolemia    Hyperglycemia    Hypertension    Hypothyroidism    s/p removal of right thyroid lobe and isthmus (1992)    Past Surgical History:  Procedure Laterality Date   BREAST EXCISIONAL BIOPSY Right 2000   benign   THYROID LOBECTOMY  1992   s/p removal of right thyroid lobe and isthmus    Current Medications: Current Meds  Medication Sig   ACCU-CHEK GUIDE test strip USE TO TEST BLOOD SUGAR ONCE DAILY   blood glucose meter kit and supplies KIT Dispense based on patient and insurance preference. Use to check sugars once daily. Dx E11.9   Lancets (ONETOUCH ULTRASOFT) lancets Use to test blood sugars 1-2 times daily   E11.9   metFORMIN (GLUCOPHAGE-XR) 500 MG 24 hr tablet TAKE 1 TABLET BY MOUTH EVERY DAY WITH BREAKFAST   omeprazole (PRILOSEC) 20 MG capsule TAKE 1 CAPSULE BY MOUTH EVERY DAY   polyethylene glycol (MIRALAX /  GLYCOLAX) packet Take 17 g by mouth daily. Mix one tablespoon with 8oz of your favorite juice or water every day until you are having soft formed stools. Then start taking once daily if you didn't have a stool the day before.   Probiotic Product (PROBIOTIC PO) Take 1 tablet by mouth daily.   propranolol (INDERAL) 40 MG tablet TAKE 1 TABLET BY MOUTH TWICE A DAY   SYNTHROID 100 MCG tablet TAKE 1 TABLET BY MOUTH EVERY DAY   telmisartan (MICARDIS) 20 MG tablet Take 1 tablet (20 mg total) by mouth daily.     Allergies:   Ciprofloxacin hcl, Penicillins, and Sulfa antibiotics   Social History   Socioeconomic History   Marital status: Married    Spouse name: Not on file   Number of children: 2   Years of education: Not on file   Highest education level: Not on file  Occupational History   Occupation: retired Pharmacist, hospital  Tobacco Use   Smoking status: Never   Smokeless tobacco: Never  Substance and Sexual Activity   Alcohol use: No    Alcohol/week: 0.0 standard drinks   Drug use: No   Sexual activity: Yes  Other Topics Concern   Not on file  Social History Narrative   Regularly exercises. Retired and married.    Social Determinants of Health   Financial Resource Strain: Not on  file  Food Insecurity: Not on file  Transportation Needs: Not on file  Physical Activity: Not on file  Stress: Not on file  Social Connections: Not on file     Family History: The patient's family history includes Breast cancer in her cousin, maternal aunt, mother, and sister; Breast cancer (age of onset: 7) in an other family member; Coronary artery disease in her mother. There is no history of Colon cancer.  ROS:   Please see the history of present illness.     All other systems reviewed and are negative.  EKGs/Labs/Other Studies Reviewed:    The following studies were reviewed today:   EKG:  EKG is  ordered today.  The ekg ordered today demonstrates sinus bradycardia, nonspecific ST-T  changes.  Recent Labs: 04/02/2020: TSH 0.44 06/15/2020: Hemoglobin 14.1; Platelets 233 07/24/2020: ALT 14; BUN 9; Creatinine, Ser 0.66; Potassium 4.1; Sodium 142  Recent Lipid Panel    Component Value Date/Time   CHOL 181 07/24/2020 1042   TRIG 134.0 07/24/2020 1042   HDL 43.20 07/24/2020 1042   CHOLHDL 4 07/24/2020 1042   VLDL 26.8 07/24/2020 1042   LDLCALC 111 (H) 07/24/2020 1042   LDLDIRECT 156.0 12/06/2012 1030     Risk Assessment/Calculations:          Physical Exam:    VS:  BP 116/68 (BP Location: Left Arm, Patient Position: Sitting, Cuff Size: Normal)   Pulse (!) 55   Ht 5' (1.524 m)   Wt 175 lb (79.4 kg)   SpO2 97%   BMI 34.18 kg/m     Wt Readings from Last 3 Encounters:  07/27/20 175 lb (79.4 kg)  07/25/20 175 lb 9.6 oz (79.7 kg)  06/15/20 170 lb (77.1 kg)     GEN:  Well nourished, well developed in no acute distress HEENT: Normal NECK: No JVD; No carotid bruits LYMPHATICS: No lymphadenopathy CARDIAC: RRR, no murmurs, rubs, gallops RESPIRATORY:  Clear to auscultation without rales, wheezing or rhonchi  ABDOMEN: Soft, non-tender, non-distended MUSCULOSKELETAL:  No edema; No deformity  SKIN: Warm and dry NEUROLOGIC:  Alert and oriented x 3 PSYCHIATRIC:  Normal affect   ASSESSMENT:    1. Pre-op evaluation   2. Primary hypertension   3. Pure hypercholesterolemia    PLAN:    In order of problems listed above:  Cataract surgery being planned next week.  EKG with nonspecific ST-T changes.  Patient has no cardiac symptoms of chest pain or shortness of breath.  Cataract surgery is deemed low risk from a cardiac perspective.  Patient is clinically asymptomatic.  As such additional cardiac testing not indicated as per [facility] AHA guidelines.  Patient can does proceed with surgery as planned from a cardiac perspective. Hypertension, BP controlled.  Continue telmisartan, atenolol as prescribed. Hyperlipidemia, declined statin, low-cholesterol diet  advised.  Follow-up as needed     Medication Adjustments/Labs and Tests Ordered: Current medicines are reviewed at length with the patient today.  Concerns regarding medicines are outlined above.  Orders Placed This Encounter  Procedures   EKG 12-Lead   No orders of the defined types were placed in this encounter.   Patient Instructions  Medication Instructions:   Your physician recommends that you continue on your current medications as directed. Please refer to the Current Medication list given to you today.  *If you need a refill on your cardiac medications before your next appointment, please call your pharmacy*   Lab Work: None ordered If you have labs (blood work) drawn today  and your tests are completely normal, you will receive your results only by: MyChart Message (if you have MyChart) OR A paper copy in the mail If you have any lab test that is abnormal or we need to change your treatment, we will call you to review the results.   Testing/Procedures: None ordered   Follow-Up: At [facility], you and your health needs are our priority.  As part of our continuing mission to provide you with exceptional heart care, we have created designated Provider Care Teams.  These Care Teams include your primary Cardiologist (physician) and Advanced Practice Providers (APPs -  Physician Assistants and Nurse Practitioners) who all work together to provide you with the care you need, when you need it.  We recommend signing up for the patient portal called "MyChart".  Sign up information is provided on this After Visit Summary.  MyChart is used to connect with patients for Virtual Visits (Telemedicine).  Patients are able to view lab/test results, encounter notes, upcoming appointments, etc.  Non-urgent messages can be sent to your provider as well.   To learn more about what you can do with MyChart, go to [email or web address].    Your next appointment:   Follow up as needed    The format for your next appointment:   In Person  Provider:   Kate Sable, MD   Other Instructions    Signed, Kate Sable, MD  07/27/2020 4:52 PM    [place]

## 2020-07-30 ENCOUNTER — Encounter: Payer: Self-pay | Admitting: *Deleted

## 2020-08-03 DIAGNOSIS — H2512 Age-related nuclear cataract, left eye: Secondary | ICD-10-CM | POA: Diagnosis not present

## 2020-08-03 DIAGNOSIS — H2511 Age-related nuclear cataract, right eye: Secondary | ICD-10-CM | POA: Diagnosis not present

## 2020-08-07 ENCOUNTER — Ambulatory Visit: Payer: Medicare PPO | Admitting: Gastroenterology

## 2020-08-07 ENCOUNTER — Encounter: Payer: Self-pay | Admitting: Gastroenterology

## 2020-08-07 ENCOUNTER — Other Ambulatory Visit: Payer: Self-pay

## 2020-08-07 VITALS — BP 125/75 | HR 59 | Temp 97.8°F | Ht 60.0 in | Wt 174.2 lb

## 2020-08-07 DIAGNOSIS — R140 Abdominal distension (gaseous): Secondary | ICD-10-CM

## 2020-08-07 DIAGNOSIS — K529 Noninfective gastroenteritis and colitis, unspecified: Secondary | ICD-10-CM | POA: Diagnosis not present

## 2020-08-07 NOTE — Patient Instructions (Signed)
Please cut out all Artifical  sweeteners Lactose-Free Diet, Adult If you have lactose intolerance, you are not able to digest lactose. Lactose is a natural sugar found mainly in dairy milk and dairy products. A lactose-freediet can help you avoid foods and beverages that contain lactose. What are tips for following this plan? Reading food labels Do not consume foods, beverages, vitamins, minerals, or medicines containing lactose. Read ingredient lists carefully. Look for the words "lactose-free" on labels. Meal planning Use alternatives to dairy milk and foods made with milk products. These include the following: Lactose-free milk. Soy milk with added calcium and vitamin D. Almond milk, coconut milk, rice milk, or other nondairy milk alternatives with added calcium and vitamin D. Note that a lot of these are low in protein. Soy products, such as soy yogurt, soy cheese, soy ice cream, and soy-based sour cream. Other nut milk products, such as almond yogurt, almond cheese, cashew yogurt, cashew cheese, cashew ice cream, coconut yogurt, and coconut ice cream. Medicines, vitamins, and supplements Use lactase enzyme drops or tablets as directed by your health care provider. Make sure you get enough calcium and vitamin D in your diet. A lactose-free eating plan can be lacking in these important nutrients. Take calcium and vitamin D supplements as directed by your health care provider. Talk with your health care provider about supplements if you are not able to get enough calcium and vitamin D from food. What foods should I eat?  Fruits All fresh, canned, frozen, or dried fruits and fruit juices that are notprocessed with lactose. Vegetables All fresh, frozen, and canned vegetables without cheese, cream, or buttersauces. Grains Any that are not made with dairy milk or dairy products. Meats and other proteins Any meat, fish, poultry, and other protein sources that are not made with dairymilk or  dairy products. Fats and oils Any that are not made with dairy milk or dairy products. Sweets and desserts Any that are not made with dairy milk or dairy products. Seasonings and condiments Any that are not made with dairy milk or dairy products. Calcium Calcium is found in many foods that contain lactose and is important for bone health. The amount of calcium you need depends on your age: Adults younger than 50 years: 1,000 mg of calcium a day. Adults older than 50 years: 1,200 mg of calcium a day. If you are not getting enough calcium, you may get it from other sources, including: Orange juice that has been fortified with calcium. This means that calcium has been added to the product. There are 300-350 mg of calcium in 1 cup (237 mL) of calcium-fortified orange juice. Soy milk fortified with calcium. There are 300-400 mg of calcium in 1 cup (237 mL) of calcium-fortified soy milk. Rice or almond milk fortified with calcium. There are 300 mg of calcium in 1 cup (237 mL) of calcium-fortified rice or almond milk. Breakfast cereals fortified with calcium. There are 100-1,000 mg of calcium in calcium-fortified breakfast cereals. Spinach, cooked. There are 145 mg of calcium in  cup (90 g) of cooked spinach. Edamame, cooked. There are 130 mg of calcium in  cup (47 g) of cooked edamame. Collard greens, cooked. There are 125 mg of calcium in  cup (85 g) of cooked collard greens. Kale, frozen or cooked. There are 90 mg of calcium in  cup (59 g) of cooked or frozen kale. Almonds. There are 95 mg of calcium in  cup (35 g) of almonds. Broccoli, cooked. There are 60  mg of calcium in 1 cup (156 g) of cooked broccoli. The items listed above may not be a complete list of foods and beverages you can eat. Contact a dietitian for more options. What foods should I avoid? Lactose is found in dairy milk and dairy products, such as: Yogurt. Cheese. Butter. Margarine. Sour cream. Cream. Whipped toppings  and creamers. Ice cream and other dairy-based desserts. Lactose is also found in foods or products made with dairy milk or milk ingredients. To find out whether a food contains dairy milk or a milk ingredient, look at the ingredients list. Avoid foods with the statement "May contain milk" and foods that contain: Milk powder. Whey. Curd. Lactose. Lactoglobulin. The items listed above may not be a complete list of foods and beverages to avoid. Contact a dietitian for more information. Where to find more information [employer] of Diabetes and Digestive and Kidney Diseases: CarFlippers.[email or web address] Summary If you are lactose intolerant, it means that you are not able to digest lactose, a natural sugar found in milk and milk products. Following a lactose-free diet can help you manage this condition. Calcium is important for bone health and is found in many foods that contain lactose. Talk with your health care provider about other sources of calcium. This information is not intended to replace advice given to you by your health care provider. Make sure you discuss any questions you have with your healthcare provider. Document Revised: 12/13/2019 Document Reviewed: 12/13/2019 Elsevier Patient Education  2022 [employer].

## 2020-08-07 NOTE — Progress Notes (Signed)
Cephas Darby, MD [address]  [place]  [place], [place] [address]  Main: [phone]  Fax: [phone]    Gastroenterology Consultation  Referring Provider:     Einar Pheasant, MD Primary Care Physician:  Einar Pheasant, MD Primary Gastroenterologist:  Dr. Cephas Darby Reason for Consultation:     Loose stools, abdominal bloating        HPI:   Veronica Branch is a 80 y.o. female referred by Dr. Einar Pheasant, MD  for consultation & management of chronic diarrhea.  Patient reports that she has been experiencing more than 1 year history of chronic nonbloody diarrhea, 4-5 episodes in the morning that usually last for 2 days followed by infrequent bowel movements for 2 to 3 days.  She takes MiraLAX as needed.  Patient also reports that she has been experiencing intermittent loose stools even before she started taking MiraLAX. She does have abdominal bloating.  She denies any weight loss.  Patient has been on metformin for more than a year and she does not think this is attributing to her diarrhea.  Patient denies any rectal bleeding.  She does acknowledge consumption of artificial sweeteners, sweet tea.  She does consume cheese every day.  Her labs revealed normal CBC, CMP, TSH.    Patient does not smoke or drink alcohol  NSAIDs: None  Antiplts/Anticoagulants/Anti thrombotics: None  GI Procedures:  Colonoscopy 08/25/2012, history of diverticulosis otherwise normal  Past Medical History:  Diagnosis Date   Diverticulosis    GERD (gastroesophageal reflux disease)    Hiatal hernia    History of migraine headaches    Hypercholesterolemia    Hyperglycemia    Hypertension    Hypothyroidism    s/p removal of right thyroid lobe and isthmus (1992)    Past Surgical History:  Procedure Laterality Date   BREAST EXCISIONAL BIOPSY Right 2000   benign   THYROID LOBECTOMY  1992   s/p removal of right thyroid lobe and isthmus    Current Outpatient Medications:     ACCU-CHEK GUIDE test strip, USE TO TEST BLOOD SUGAR ONCE DAILY, Disp: 100 strip, Rfl: 3   blood glucose meter kit and supplies KIT, Dispense based on patient and insurance preference. Use to check sugars once daily. Dx E11.9, Disp: 1 each, Rfl: 0   Lancets (ONETOUCH ULTRASOFT) lancets, Use to test blood sugars 1-2 times daily   E11.9, Disp: 100 each, Rfl: 12   metFORMIN (GLUCOPHAGE-XR) 500 MG 24 hr tablet, TAKE 1 TABLET BY MOUTH EVERY DAY WITH BREAKFAST, Disp: 90 tablet, Rfl: 1   omeprazole (PRILOSEC) 20 MG capsule, TAKE 1 CAPSULE BY MOUTH EVERY DAY, Disp: 90 capsule, Rfl: 0   polyethylene glycol (MIRALAX / GLYCOLAX) packet, Take 17 g by mouth daily. Mix one tablespoon with 8oz of your favorite juice or water every day until you are having soft formed stools. Then start taking once daily if you didn't have a stool the day before., Disp: 30 each, Rfl: 0   Probiotic Product (PROBIOTIC PO), Take 1 tablet by mouth daily., Disp: , Rfl:    propranolol (INDERAL) 40 MG tablet, TAKE 1 TABLET BY MOUTH TWICE A DAY, Disp: 180 tablet, Rfl: 3   SYNTHROID 100 MCG tablet, TAKE 1 TABLET BY MOUTH EVERY DAY, Disp: 90 tablet, Rfl: 3   telmisartan (MICARDIS) 20 MG tablet, Take 1 tablet (20 mg total) by mouth daily., Disp: 30 tablet, Rfl: 2    Family History  Problem Relation Age of Onset  Coronary artery disease Mother        myocardial infarction and CHF   Breast cancer Mother        108's   Breast cancer Maternal Aunt        x 2   Breast cancer Sister        54's   Breast cancer Cousin        maternal side   Breast cancer Other 11   Colon cancer Neg Hx      Social History   Tobacco Use   Smoking status: Never   Smokeless tobacco: Never  Substance Use Topics   Alcohol use: No    Alcohol/week: 0.0 standard drinks   Drug use: No    Allergies as of 08/07/2020 - Review Complete 08/07/2020  Allergen Reaction Noted   Ciprofloxacin hcl Other (See Comments) 11/25/2011   Penicillins Hives 11/25/2011    Sulfa antibiotics Rash 11/25/2011    Review of Systems:    All systems reviewed and negative except where noted in HPI.   Physical Exam:  BP 125/75 (BP Location: Left Arm, Patient Position: Sitting, Cuff Size: Normal)   Pulse (!) 59   Temp 97.8 F (36.6 C) (Oral)   Ht 5' (1.524 m)   Wt 174 lb 4 oz (79 kg)   BMI 34.03 kg/m  No LMP recorded. Patient is postmenopausal.  General:   Alert,  Well-developed, well-nourished, pleasant and cooperative in NAD Head:  Normocephalic and atraumatic. Eyes:  Sclera clear, no icterus.   Conjunctiva pink. Ears:  Normal auditory acuity. Nose:  No deformity, discharge, or lesions. Mouth:  No deformity or lesions,oropharynx pink & moist. Neck:  Supple; no masses or thyromegaly. Lungs:  Respirations even and unlabored.  Clear throughout to auscultation.   No wheezes, crackles, or rhonchi. No acute distress. Heart:  Regular rate and rhythm; no murmurs, clicks, rubs, or gallops. Abdomen:  Normal bowel sounds. Soft, non-tender and moderately diffusely distended, tympanic to percussion without masses, hepatosplenomegaly or hernias noted.  No guarding or rebound tenderness.   Rectal: Not performed Msk:  Symmetrical without gross deformities. Good, equal movement & strength bilaterally. Pulses:  Normal pulses noted. Extremities:  No clubbing or edema.  No cyanosis. Neurologic:  Alert and oriented x3;  grossly normal neurologically. Skin:  Intact without significant lesions or rashes. No jaundice. Psych:  Alert and cooperative. Normal mood and affect.  Imaging Studies: Reviewed  Assessment and Plan:   Veronica Branch is a 80 y.o. female with obesity, BMI 34, metabolic syndrome is seen in consultation for chronic nonbloody diarrhea and abdominal bloating without any other constitutional symptoms  Differentials include lactose intolerance or EPI or IBS diarrhea or H. pylori infection or celiac disease or osmotic diarrhea secondary to artificial  sweeteners or SIBO Discussed about strict lactose-free diet for 2 weeks, information provided Strongly advised to completely eliminate artificial sweeteners for 2 weeks Check celiac panel, H. pylori breath test and pancreatic fecal elastase levels If above work-up is unremarkable and her symptoms are persistent, recommend colonoscopy with TI evaluation and biopsies   Follow up in 2 to 3 months   Cephas Darby, MD

## 2020-08-09 ENCOUNTER — Other Ambulatory Visit: Payer: Self-pay | Admitting: Gastroenterology

## 2020-08-09 DIAGNOSIS — R140 Abdominal distension (gaseous): Secondary | ICD-10-CM | POA: Diagnosis not present

## 2020-08-09 DIAGNOSIS — K529 Noninfective gastroenteritis and colitis, unspecified: Secondary | ICD-10-CM | POA: Diagnosis not present

## 2020-08-14 LAB — CELIAC DISEASE PANEL
Endomysial IgA: NEGATIVE
IgA/Immunoglobulin A, Serum: 159 mg/dL (ref 64–422)
Transglutaminase IgA: <2 U/mL (ref 0–3)

## 2020-08-14 LAB — H. PYLORI BREATH TEST: H pylori Breath Test: NEGATIVE

## 2020-08-16 LAB — PANCREATIC ELASTASE, FECAL: Pancreatic Elastase, Fecal: 203 ug Elast./g (ref >200)

## 2020-08-20 ENCOUNTER — Telehealth: Payer: Self-pay | Admitting: Internal Medicine

## 2020-08-20 NOTE — Telephone Encounter (Signed)
Left message for patient to call back and schedule Medicare Annual Wellness Visit (AWV) in office.   If not able to come in office, please offer to do virtually or by telephone.   Last AWV:02/11/2016   Please schedule at anytime with Nurse Health Advisor.   

## 2020-08-28 ENCOUNTER — Telehealth: Payer: Self-pay

## 2020-08-28 DIAGNOSIS — K529 Noninfective gastroenteritis and colitis, unspecified: Secondary | ICD-10-CM

## 2020-08-28 NOTE — Telephone Encounter (Signed)
-----   Message from Toney Reil, MD sent at 08/28/2020 11:12 AM EDT ----- Morrie Sheldon  Please inform patient that her tests came back normal. Recommend to check GI profile PCR to rule out infection before deciding on the colonoscopy  Thanks RV

## 2020-08-28 NOTE — Telephone Encounter (Signed)
Called and left a message for call back  

## 2020-08-29 NOTE — Telephone Encounter (Signed)
Called and left a message for call back  

## 2020-08-29 NOTE — Telephone Encounter (Signed)
Called and left a message for call back. Tried emergency contact which is patient husband and was able to talk to patient on that phone. She verbalized understanding. She states she will pick up the stool kit at the [facility] and return it there

## 2020-09-04 ENCOUNTER — Telehealth: Payer: Self-pay | Admitting: Internal Medicine

## 2020-09-04 NOTE — Telephone Encounter (Signed)
PT called to advise that the PT husband has had surgery and she needs to cancel her apt tomorrow to be with him to watch over him. PT advise that if anything is needed Dr.Scott or Bethann Berkshire can call her esp if its in regards to med.

## 2020-09-05 ENCOUNTER — Ambulatory Visit: Payer: Medicare PPO | Admitting: Internal Medicine

## 2020-09-05 NOTE — Telephone Encounter (Signed)
PT called to return the call from [place] she states

## 2020-09-06 ENCOUNTER — Other Ambulatory Visit: Payer: Self-pay

## 2020-09-06 MED ORDER — TELMISARTAN 20 MG PO TABS: 20.0000 mg | ORAL_TABLET | Freq: Every day | ORAL | 2 refills | Status: DC

## 2020-09-06 NOTE — Telephone Encounter (Signed)
Patient needed her micardis refilled and had to cancel appt because she is taking care of her husband who just had surgery. Would like to postpone appt until his therapy is over. Patient will call back to schedule.

## 2020-09-13 ENCOUNTER — Telehealth: Payer: Self-pay | Admitting: Internal Medicine

## 2020-09-13 ENCOUNTER — Other Ambulatory Visit: Payer: Self-pay | Admitting: Internal Medicine

## 2020-09-13 NOTE — Telephone Encounter (Signed)
Patient's husband expressed that his wife needed to see Dr.Scott. He stated that he thinks his wife has Shingles. I informed him that the provider was not in the office and we do not have any available appointments . However, he could take his wife to the [facility] health urgent care across the street from this office. He stated he would talk with the patient about going.

## 2020-09-14 NOTE — Telephone Encounter (Signed)
Agree with evaluation.  If bumps on both sides of face and (possibly hand) - does not sound like shingles.  Would need evaluation to be able to determine best treatment.

## 2020-09-14 NOTE — Telephone Encounter (Signed)
Called and spoke to [name]. Veronica Branch states that she is having a rash break out on her right cheek and along her right hand. Pt states that the bumps have now appeared on her left cheek. Pt states that the bumps are raised and are not like blisters. Pt states that she has had shingles before and does not know if she currently has shingles. Pt states that she currently has no raised  bumps along her body and it is only on her face. Pt reports stinging and itching along her hand but not along her face. Informed pt that Dr. Lorin Picket would want her to be evaluated today and recommended [place] [facility] [place] [facility]. Pt states that she is not in any pain and is using calamine lotion to relieve the itching. Pt declines going to urgent care at this moment due to it not hurting and the itching being controlled. Pt states that she will contact her eye doctor, Dr. Alvester Morin, to discuss the rash being close to her eyes as she has an upcoming eye appointment in September. Pt states that if the rash spreads anymore, she will got to [facility] for an evaluation. Veronica Branch asks to send [place] a photo of the rash and states that she will use her husbands mychart as she does not have one set up.

## 2020-09-14 NOTE — Telephone Encounter (Signed)
Patient has an appointment with urgent care tomorrow. Cannot go today because PT is coming out to work with husband this PM and she needed to be home with him.

## 2020-09-15 ENCOUNTER — Ambulatory Visit: Payer: Self-pay

## 2020-10-10 ENCOUNTER — Telehealth: Payer: Self-pay | Admitting: Internal Medicine

## 2020-10-10 ENCOUNTER — Ambulatory Visit
Admission: EM | Admit: 2020-10-10 | Discharge: 2020-10-10 | Disposition: A | Discharge status: Home / Self Care | Payer: Medicare PPO | Attending: Emergency Medicine | Admitting: Emergency Medicine

## 2020-10-10 ENCOUNTER — Other Ambulatory Visit: Payer: Self-pay

## 2020-10-10 ENCOUNTER — Encounter: Payer: Self-pay | Admitting: Emergency Medicine

## 2020-10-10 DIAGNOSIS — N390 Urinary tract infection, site not specified: Secondary | ICD-10-CM | POA: Insufficient documentation

## 2020-10-10 LAB — POCT URINALYSIS DIP (MANUAL ENTRY)
Bilirubin, UA: NEGATIVE
Glucose, UA: NEGATIVE mg/dL
Ketones, POC UA: NEGATIVE mg/dL
Nitrite, UA: POSITIVE — AB
Protein Ur, POC: NEGATIVE mg/dL
Spec Grav, UA: 1.015 (ref 1.010–1.025)
Urobilinogen, UA: 1 E.U./dL
pH, UA: 5.5 (ref 5.0–8.0)

## 2020-10-10 MED ORDER — NITROFURANTOIN MONOHYD MACRO 100 MG PO CAPS: 100.0000 mg | ORAL_CAPSULE | Freq: Two times a day (BID) | ORAL | 0 refills | Status: DC

## 2020-10-10 NOTE — ED Triage Notes (Signed)
Onset Sunday of urinary symptoms.  Seen by provider prior to this nurse

## 2020-10-10 NOTE — Discharge Instructions (Signed)

## 2020-10-10 NOTE — Telephone Encounter (Signed)
She is being evaluated at [facility]

## 2020-10-10 NOTE — Telephone Encounter (Signed)
Access nurse instructed patient to go to UC. Patient stated they would comply.

## 2020-10-10 NOTE — ED Provider Notes (Signed)
Subjective:    Veronica Branch is a very pleasant 80 y.o. female who presents with concerns for UTI due to dysuria onset Sunday. No unilateral back pain, vomiting, fever.  Past medical history, past surgical history, current medications reviewed.  Allergies: is allergic to ciprofloxacin hcl, penicillins, and sulfa antibiotics.  Review of Systems See HPI   Objective:     Vitals:   10/10/20 1143  BP: 123/78  Pulse: (!) 56  Resp: 18  Temp: 97.8 F (36.6 C)  SpO2: 96%     General: Appears well-developed and well-nourished. No acute distress.  Cardiovascular: Normal rate Pulm/Chest: No respiratory distress.  Abdominal: No CVAT. Neurological: Alert and oriented to person, place, and time.  Skin: Skin is warm and dry.  Psychiatric: Normal mood, affect, behavior, and thought content.  GU:  Deferred secondary to self collect specimen  Laboratory:  Orders Placed This Encounter  Procedures   Urine Culture   POCT urinalysis dipstick   Results for orders placed or performed during the hospital encounter of 10/10/20  POCT urinalysis dipstick  Result Value Ref Range   Color, UA orange (A) yellow   Clarity, UA cloudy (A) clear   Glucose, UA negative negative mg/dL   Bilirubin, UA negative negative   Ketones, POC UA negative negative mg/dL   Spec Grav, UA 9.381 8.299 - 1.025   Blood, UA moderate (A) negative   pH, UA 5.5 5.0 - 8.0   Protein Ur, POC negative negative mg/dL   Urobilinogen, UA 1.0 0.2 or 1.0 E.U./dL   Nitrite, UA Positive (A) Negative   Leukocytes, UA Small (1+) (A) Negative     Assessment:   1. Acute UTI - Urine Culture; Standing - Urine Culture - nitrofurantoin, macrocrystal-monohydrate, (MACROBID) 100 MG capsule; Take 1 capsule (100 mg total) by mouth 2 (two) times daily.  Dispense: 10 capsule; Refill: 0  Plan:   MDM: Patient presents with concerns for UTI due to dysuria onset Sunday. No unilateral back pain, vomiting, fever.  Urinalysis reveals  orange cloudy urine, moderate blood, positive nitrite and small leukocytes.  Urine culture pending.  Given symptoms and urinalysis results, likely acute UTI.  Rx Macrobid to the patient's preferred pharmacy.  Advised that we would call with any changes to her antibiotic regimen secondary to the results of her culture.  Advised to increase fluid intake and to report to the clinic or go to the ER with any fever, unilateral back pain or vomiting.  Patient verbalized understanding and agreed with plan.  Patient stable upon discharge.    Discharge Instructions      You were seen today for an infection in the lower urinary tract. Your urine sample today was sent for a culture. The urine culture will show what type of bacteria grows and if you are on the appropriate antibiotic. If the antibiotic needs to be changed, you will receive a phone call from the follow up nurse who will give you more information. If you do not receive a call, then you are on the correct antibiotic.  Take antibiotics as directed. Finish course even if feeling better sooner. Drink plenty of clear fluids. Over the counter "Uristat" or "Azo Standard" may help your discomfort.  This will turn your urine dark orange or red and may stain contacts (remove before using). You may experience 24 - 48 hours of continuing discomfort until medication controls the infection.  Return to clinic or go to the ER if you develop a fever, one-sided back pain,  or vomiting as these are signs of a worsening infection.          Amalia Greenhouse, FNP 10/10/20 1200

## 2020-10-10 NOTE — Telephone Encounter (Signed)
Patient informed, Due to the high volume of calls and your symptoms we have to forward your call to our Triage Nurse to expedient your call. Please hold for the transfer.  Patient transferred to [facility] at Access Nurse. Due to a possible UTI for the past 3 days and taking AZO but it is not helping.No openings in office or virtual.

## 2020-10-12 LAB — URINE CULTURE: Culture: >=100000 — AB

## 2020-11-01 ENCOUNTER — Other Ambulatory Visit: Payer: Self-pay | Admitting: Internal Medicine

## 2020-11-07 ENCOUNTER — Ambulatory Visit: Payer: Medicare PPO | Admitting: Gastroenterology

## 2020-11-26 ENCOUNTER — Ambulatory Visit: Payer: Medicare PPO | Admitting: Internal Medicine

## 2020-11-27 ENCOUNTER — Other Ambulatory Visit: Payer: Self-pay

## 2020-11-27 ENCOUNTER — Ambulatory Visit (INDEPENDENT_AMBULATORY_CARE_PROVIDER_SITE_OTHER): Payer: Medicare PPO

## 2020-11-27 DIAGNOSIS — Z23 Encounter for immunization: Secondary | ICD-10-CM | POA: Diagnosis not present

## 2020-12-18 ENCOUNTER — Ambulatory Visit: Payer: Medicare PPO | Admitting: Internal Medicine

## 2020-12-18 ENCOUNTER — Encounter: Payer: Self-pay | Admitting: Internal Medicine

## 2020-12-18 ENCOUNTER — Other Ambulatory Visit: Payer: Self-pay

## 2020-12-18 VITALS — BP 131/78 | HR 60 | Temp 96.1°F | Ht 60.0 in | Wt 172.4 lb

## 2020-12-18 DIAGNOSIS — E7800 Pure hypercholesterolemia, unspecified: Secondary | ICD-10-CM | POA: Diagnosis not present

## 2020-12-18 DIAGNOSIS — I10 Essential (primary) hypertension: Secondary | ICD-10-CM

## 2020-12-18 DIAGNOSIS — R195 Other fecal abnormalities: Secondary | ICD-10-CM | POA: Diagnosis not present

## 2020-12-18 DIAGNOSIS — E1165 Type 2 diabetes mellitus with hyperglycemia: Secondary | ICD-10-CM | POA: Diagnosis not present

## 2020-12-18 DIAGNOSIS — R300 Dysuria: Secondary | ICD-10-CM

## 2020-12-18 DIAGNOSIS — I700 Atherosclerosis of aorta: Secondary | ICD-10-CM

## 2020-12-18 DIAGNOSIS — E039 Hypothyroidism, unspecified: Secondary | ICD-10-CM | POA: Diagnosis not present

## 2020-12-18 DIAGNOSIS — E113393 Type 2 diabetes mellitus with moderate nonproliferative diabetic retinopathy without macular edema, bilateral: Secondary | ICD-10-CM | POA: Diagnosis not present

## 2020-12-18 LAB — URINALYSIS, ROUTINE W REFLEX MICROSCOPIC
Bilirubin Urine: NEGATIVE
Ketones, ur: NEGATIVE
Leukocytes,Ua: NEGATIVE
Nitrite: NEGATIVE
Specific Gravity, Urine: 1.025 (ref 1.000–1.030)
Total Protein, Urine: NEGATIVE
Urine Glucose: NEGATIVE
Urobilinogen, UA: 0.2 (ref 0.0–1.0)
pH: 5.5 (ref 5.0–8.0)

## 2020-12-18 LAB — HEPATIC FUNCTION PANEL
ALT: 11 U/L (ref 0–35)
AST: 11 U/L (ref 0–37)
Albumin: 4.1 g/dL (ref 3.5–5.2)
Alkaline Phosphatase: 62 U/L (ref 39–117)
Bilirubin, Direct: 0.1 mg/dL (ref 0.0–0.3)
Total Bilirubin: 0.5 mg/dL (ref 0.2–1.2)
Total Protein: 6.9 g/dL (ref 6.0–8.3)

## 2020-12-18 LAB — BASIC METABOLIC PANEL
BUN: 10 mg/dL (ref 6–23)
CO2: 27 mEq/L (ref 19–32)
Calcium: 9.6 mg/dL (ref 8.4–10.5)
Chloride: 106 mEq/L (ref 96–112)
Creatinine, Ser: 0.73 mg/dL (ref 0.40–1.20)
GFR: 78.36 mL/min (ref >60.00)
Glucose, Bld: 105 mg/dL — ABNORMAL HIGH (ref 70–99)
Potassium: 4 mEq/L (ref 3.5–5.1)
Sodium: 140 mEq/L (ref 135–145)

## 2020-12-18 LAB — LIPID PANEL
Cholesterol: 182 mg/dL (ref 0–200)
HDL: 54.8 mg/dL (ref >39.00)
LDL Cholesterol: 110 mg/dL — ABNORMAL HIGH (ref 0–99)
NonHDL: 126.86
Total CHOL/HDL Ratio: 3
Triglycerides: 86 mg/dL (ref 0.0–149.0)
VLDL: 17.2 mg/dL (ref 0.0–40.0)

## 2020-12-18 LAB — HM DIABETES FOOT EXAM

## 2020-12-18 LAB — TSH: TSH: 0.26 u[IU]/mL — ABNORMAL LOW (ref 0.35–5.50)

## 2020-12-18 NOTE — Progress Notes (Signed)
Patient ID: Veronica Branch, female   DOB: 05-20-1940, 80 y.o.   MRN: [id number]   Subjective:    Patient ID: Veronica Branch, female    DOB: [birth year], 80 y.o.   MRN: [id number]  This visit occurred during the SARS-CoV-2 public health emergency.  Safety protocols were in place, including screening questions prior to the visit, additional usage of staff PPE, and extensive cleaning of exam room while observing appropriate contact time as indicated for disinfecting solutions.   Patient here for a scheduled follow up.     HPI Here to follow up regarding her blood sugar, blood pressure and cholesterol.  Also has had issues with chronic diarrhea.  Seeing Dr Marius Ditch.  Recommended lactose free diet and also advised to avoid artificial sweeteners.  She is trying to watch her diet better now regarding the above.  Bowels are some better.  Discussed sugars.  She is skipping bread.  States checks sugars 3x/week - AM 105-115.  No chest pain or sob reported.  No abdominal pain reported.  No increased cough or congestion.     Past Medical History:  Diagnosis Date   Diverticulosis    GERD (gastroesophageal reflux disease)    Hiatal hernia    History of migraine headaches    Hypercholesterolemia    Hyperglycemia    Hypertension    Hypothyroidism    s/p removal of right thyroid lobe and isthmus (1992)   Past Surgical History:  Procedure Laterality Date   BREAST EXCISIONAL BIOPSY Right 2000   benign   THYROID LOBECTOMY  1992   s/p removal of right thyroid lobe and isthmus   Family History  Problem Relation Age of Onset   Coronary artery disease Mother        myocardial infarction and CHF   Breast cancer Mother        65's   Breast cancer Maternal Aunt        x 2   Breast cancer Sister        57's   Breast cancer Cousin        maternal side   Breast cancer Other 62   Colon cancer Neg Hx    Social History   Socioeconomic History   Marital status: Married    Spouse name: Not on file    Number of children: 2   Years of education: Not on file   Highest education level: Not on file  Occupational History   Occupation: retired Pharmacist, hospital  Tobacco Use   Smoking status: Never   Smokeless tobacco: Never  Vaping Use   Vaping Use: Never used  Substance and Sexual Activity   Alcohol use: No    Alcohol/week: 0.0 standard drinks   Drug use: No   Sexual activity: Yes  Other Topics Concern   Not on file  Social History Narrative   Regularly exercises. Retired and married.    Social Determinants of Health   Financial Resource Strain: Not on file  Food Insecurity: Not on file  Transportation Needs: Not on file  Physical Activity: Not on file  Stress: Not on file  Social Connections: Not on file     Review of Systems  Constitutional:  Negative for appetite change and unexpected weight change.  HENT:  Negative for congestion and sinus pressure.   Respiratory:  Negative for cough, chest tightness and shortness of breath.   Cardiovascular:  Negative for chest pain, palpitations and leg swelling.  Gastrointestinal:  Negative for abdominal pain, nausea  and vomiting.       Issues with chronic diarrhea as outlined.    Genitourinary:  Negative for difficulty urinating and dysuria.  Musculoskeletal:  Negative for joint swelling and myalgias.  Skin:  Negative for color change and rash.  Neurological:  Negative for dizziness, light-headedness and headaches.  Psychiatric/Behavioral:  Negative for agitation and dysphoric mood.       Objective:     BP 131/78 (BP Location: Left Arm, Patient Position: Sitting, Cuff Size: Normal)   Pulse 60   Temp (!) 96.1 F (35.6 C) (Temporal)   Ht 5' (1.524 m)   Wt 172 lb 6.4 oz (78.2 kg)   SpO2 97%   BMI 33.67 kg/m  Wt Readings from Last 3 Encounters:  12/18/20 172 lb 6.4 oz (78.2 kg)  08/07/20 174 lb 4 oz (79 kg)  07/27/20 175 lb (79.4 kg)    Physical Exam Vitals reviewed.  Constitutional:      General: She is not in acute  distress.    Appearance: Normal appearance.  HENT:     Head: Normocephalic and atraumatic.     Right Ear: External ear normal.     Left Ear: External ear normal.  Eyes:     General: No scleral icterus.       Right eye: No discharge.        Left eye: No discharge.     Conjunctiva/sclera: Conjunctivae normal.  Neck:     Thyroid: No thyromegaly.  Cardiovascular:     Rate and Rhythm: Normal rate and regular rhythm.  Pulmonary:     Effort: No respiratory distress.     Breath sounds: Normal breath sounds. No wheezing.  Abdominal:     General: Bowel sounds are normal.     Palpations: Abdomen is soft.     Tenderness: There is no abdominal tenderness.  Musculoskeletal:        General: No swelling or tenderness.     Cervical back: Neck supple. No tenderness.  Lymphadenopathy:     Cervical: No cervical adenopathy.  Skin:    Findings: No erythema or rash.  Neurological:     Mental Status: She is alert.  Psychiatric:        Mood and Affect: Mood normal.        Behavior: Behavior normal.     Outpatient Encounter Medications as of 12/18/2020  Medication Sig   ACCU-CHEK GUIDE test strip USE TO TEST BLOOD SUGAR ONCE DAILY   blood glucose meter kit and supplies KIT Dispense based on patient and insurance preference. Use to check sugars once daily. Dx E11.9   Lancets (ONETOUCH ULTRASOFT) lancets Use to test blood sugars 1-2 times daily   E11.9   metFORMIN (GLUCOPHAGE-XR) 500 MG 24 hr tablet TAKE 1 TABLET BY MOUTH EVERY DAY WITH BREAKFAST   omeprazole (PRILOSEC) 20 MG capsule TAKE 1 CAPSULE BY MOUTH EVERY DAY   polyethylene glycol (MIRALAX / GLYCOLAX) packet Take 17 g by mouth daily. Mix one tablespoon with 8oz of your favorite juice or water every day until you are having soft formed stools. Then start taking once daily if you didn't have a stool the day before.   propranolol (INDERAL) 40 MG tablet TAKE 1 TABLET BY MOUTH TWICE A DAY   SYNTHROID 100 MCG tablet TAKE 1 TABLET BY MOUTH EVERY  DAY   telmisartan (MICARDIS) 20 MG tablet Take 1 tablet (20 mg total) by mouth daily.   [DISCONTINUED] Blood Glucose Monitoring Suppl (ONE TOUCH ULTRA SYSTEM KIT)  W/DEVICE KIT 1 kit by Does not apply route once.   [DISCONTINUED] nitrofurantoin, macrocrystal-monohydrate, (MACROBID) 100 MG capsule Take 1 capsule (100 mg total) by mouth 2 (two) times daily. (Patient not taking: Reported on 12/18/2020)   [DISCONTINUED] Probiotic Product (PROBIOTIC PO) Take 1 tablet by mouth daily. (Patient not taking: Reported on 12/18/2020)   No facility-administered encounter medications on file as of 12/18/2020.     Lab Results  Component Value Date   WBC 9.1 06/15/2020   HGB 14.1 06/15/2020   HCT 42.2 06/15/2020   PLT 233 06/15/2020   GLUCOSE 105 (H) 12/18/2020   CHOL 182 12/18/2020   TRIG 86.0 12/18/2020   HDL 54.80 12/18/2020   LDLDIRECT 156.0 12/06/2012   LDLCALC 110 (H) 12/18/2020   ALT 11 12/18/2020   AST 11 12/18/2020   NA 140 12/18/2020   K 4.0 12/18/2020   CL 106 12/18/2020   CREATININE 0.73 12/18/2020   BUN 10 12/18/2020   CO2 27 12/18/2020   TSH 0.26 (L) 12/18/2020   HGBA1C 6.6 (H) 12/18/2020   MICROALBUR 1.0 04/02/2020       Assessment & Plan:   Problem List Items Addressed This Visit     Aortic atherosclerosis ([place])    Have discussed findings on CT scan.  Is off crestor.  Have discussed restarting statin. Wants to hold on restarting.        Diabetes ([place])    Has adjusted diet some as outlined.  Low carb diet and exercise.  Check met b and a1c.  Continue metformin XR for now.  Follow bowels.  If persistent problems, may need to change.        Relevant Orders   Hemoglobin A1c (Completed)   Dysuria   Relevant Orders   Urinalysis, Routine w reflex microscopic (Completed)   Urine Culture (Completed)   Hypercholesterolemia    Not on a statin.  Have discussed given history of diabetes, aortic atherosclerosis noted on CT - recommendation for statin medication.  She has  declined.  Low cholesterol diet and exercise.  Follow lipid panel.       Relevant Orders   Hepatic function panel (Completed)   Lipid panel (Completed)   Hypertension - Primary    Blood pressure as outlined.  Now on micardis.  Follow pressures.  Follow metabolic panel. Cough/clearing improved with changing to micardis.        Relevant Orders   Basic metabolic panel (Completed)   Hypothyroidism    On thyroid replacement.  Follow tsh.       Relevant Orders   TSH (Completed)   Loose stools    Recent CT - no acute abnormality. Previous pain - resolved. Seeing Dr Marius Ditch.  Has adjusted diet (recommended avoiding artificial sweeteners and lactose free diet).  Has improved some.  Follow.          Einar Pheasant, MD

## 2020-12-19 LAB — URINE CULTURE
MICRO NUMBER:: 12690024
SPECIMEN QUALITY:: ADEQUATE

## 2020-12-19 LAB — HEMOGLOBIN A1C: Hgb A1c MFr Bld: 6.6 % — ABNORMAL HIGH (ref 4.6–6.5)

## 2020-12-21 ENCOUNTER — Telehealth: Payer: Self-pay | Admitting: *Deleted

## 2020-12-21 NOTE — Telephone Encounter (Signed)
Left voicemail to return call for lab results

## 2020-12-22 ENCOUNTER — Other Ambulatory Visit: Payer: Self-pay | Admitting: Internal Medicine

## 2020-12-23 NOTE — Assessment & Plan Note (Signed)
Not on a statin.  Have discussed given history of diabetes, aortic atherosclerosis noted on CT - recommendation for statin medication.  She has declined.  Low cholesterol diet and exercise.  Follow lipid panel.  

## 2020-12-23 NOTE — Assessment & Plan Note (Signed)
Blood pressure as outlined.  Now on micardis.  Follow pressures.  Follow metabolic panel. Cough/clearing improved with changing to micardis.

## 2020-12-23 NOTE — Assessment & Plan Note (Signed)
Has adjusted diet some as outlined.  Low carb diet and exercise.  Check met b and a1c.  Continue metformin XR for now.  Follow bowels.  If persistent problems, may need to change.

## 2020-12-23 NOTE — Assessment & Plan Note (Signed)
On thyroid replacement.  Follow tsh.  

## 2020-12-23 NOTE — Assessment & Plan Note (Signed)
Recent CT - no acute abnormality. Previous pain - resolved. Seeing Dr Allegra Lai.  Has adjusted diet (recommended avoiding artificial sweeteners and lactose free diet).  Has improved some.  Follow.

## 2020-12-23 NOTE — Assessment & Plan Note (Addendum)
Have discussed findings on CT scan.  Is off crestor.  Have discussed restarting statin. Wants to hold on restarting.

## 2020-12-25 NOTE — Progress Notes (Signed)
Patient requested her results be mailed to her home.  Latesha Chesney,cma

## 2020-12-26 ENCOUNTER — Other Ambulatory Visit: Payer: Self-pay | Admitting: Internal Medicine

## 2020-12-26 MED ORDER — NYSTATIN 100000 UNIT/GM EX CREA: 1.0000 "application " | TOPICAL_CREAM | Freq: Two times a day (BID) | CUTANEOUS | 0 refills | Status: DC

## 2020-12-26 MED ORDER — LEVOTHYROXINE SODIUM 88 MCG PO TABS: 88.0000 ug | ORAL_TABLET | Freq: Every day | ORAL | 3 refills | Status: DC

## 2020-12-26 NOTE — Progress Notes (Unsigned)
Rx sent for nystatin cream and for new dose of synthroid. Tell her to let us know if she continues to have vaginal burning.

## 2020-12-26 NOTE — Progress Notes (Signed)
LMTCB

## 2020-12-31 NOTE — Progress Notes (Signed)
Patient aware of below and will let us know if symptoms persist.

## 2021-01-19 ENCOUNTER — Other Ambulatory Visit: Payer: Self-pay | Admitting: Internal Medicine

## 2021-02-01 ENCOUNTER — Other Ambulatory Visit: Payer: Self-pay | Admitting: Internal Medicine

## 2021-03-11 ENCOUNTER — Other Ambulatory Visit: Payer: Self-pay | Admitting: Internal Medicine

## 2021-04-18 ENCOUNTER — Ambulatory Visit: Payer: Medicare PPO | Admitting: Internal Medicine

## 2021-04-18 VITALS — BP 124/70 | HR 65 | Temp 97.9°F | Resp 16 | Ht 60.0 in | Wt 177.0 lb

## 2021-04-18 DIAGNOSIS — E1165 Type 2 diabetes mellitus with hyperglycemia: Secondary | ICD-10-CM

## 2021-04-18 DIAGNOSIS — K429 Umbilical hernia without obstruction or gangrene: Secondary | ICD-10-CM

## 2021-04-18 DIAGNOSIS — I700 Atherosclerosis of aorta: Secondary | ICD-10-CM | POA: Diagnosis not present

## 2021-04-18 DIAGNOSIS — Z1231 Encounter for screening mammogram for malignant neoplasm of breast: Secondary | ICD-10-CM | POA: Diagnosis not present

## 2021-04-18 DIAGNOSIS — R300 Dysuria: Secondary | ICD-10-CM | POA: Diagnosis not present

## 2021-04-18 DIAGNOSIS — E039 Hypothyroidism, unspecified: Secondary | ICD-10-CM

## 2021-04-18 DIAGNOSIS — R195 Other fecal abnormalities: Secondary | ICD-10-CM

## 2021-04-18 DIAGNOSIS — I10 Essential (primary) hypertension: Secondary | ICD-10-CM

## 2021-04-18 DIAGNOSIS — E7800 Pure hypercholesterolemia, unspecified: Secondary | ICD-10-CM

## 2021-04-18 DIAGNOSIS — Z0000 Encounter for general adult medical examination without abnormal findings: Secondary | ICD-10-CM

## 2021-04-18 DIAGNOSIS — R140 Abdominal distension (gaseous): Secondary | ICD-10-CM

## 2021-04-18 NOTE — Assessment & Plan Note (Addendum)
Physical today 04/18/21.  Mammogram 05/22/20 - Birads I.  Has declined colonoscopy.  Discussed f/u colonoscopy.  Agreeable for f/u with GI to discuss further w/up and evaluation.  ?

## 2021-04-18 NOTE — Progress Notes (Signed)
Patient ID: Veronica Branch, female   DOB: [birth year], 81 y.o.   MRN: [id number] ? ? ?Subjective:  ? ? Patient ID: Veronica Branch, female    DOB: 31-Jul-1940, 81 y.o.   MRN: [id number] ? ?This visit occurred during the SARS-CoV-2 public health emergency.  Safety protocols were in place, including screening questions prior to the visit, additional usage of staff PPE, and extensive cleaning of exam room while observing appropriate contact time as indicated for disinfecting solutions.  ? ?Patient here for a physical.  ? ?Chief Complaint  ?Patient presents with  ? Hypertension  ? Hyperlipidemia  ? Hypothyroidism  ? .  ? ?HPI ?Has had issues with chronic diarrhea.  Saw GI.  Advised to avoid artificial sweeteners.  Felt to have lactose intolerance.  Has been trying to adjust diet.  Still with intermittent issues with flares - diarrhea (flares may last 1-2 days).  Has decreased "meat" intake.  No nausea or vomiting.  Some occasional acid.  Takes TUMS.  Off probiotics - feels this has helped.  Is having issues with her hernia.  Some increased discomfort.  Bloated feeling.  No chest pain.  Breathing stable.  ? ? ?Past Medical History:  ?Diagnosis Date  ? Diverticulosis   ? GERD (gastroesophageal reflux disease)   ? Hiatal hernia   ? History of migraine headaches   ? Hypercholesterolemia   ? Hyperglycemia   ? Hypertension   ? Hypothyroidism   ? s/p removal of right thyroid lobe and isthmus (1992)  ? ?Past Surgical History:  ?Procedure Laterality Date  ? BREAST EXCISIONAL BIOPSY Right 2000  ? benign  ? THYROID LOBECTOMY  1992  ? s/p removal of right thyroid lobe and isthmus  ? ?Family History  ?Problem Relation Age of Onset  ? Coronary artery disease Mother   ?     myocardial infarction and CHF  ? Breast cancer Mother   ?     59's  ? Breast cancer Maternal Aunt   ?     x 2  ? Breast cancer Sister   ?     71's  ? Breast cancer Cousin   ?     maternal side  ? Breast cancer Other 51  ? Colon cancer Neg Hx   ? ?Social History   ? ?Socioeconomic History  ? Marital status: Married  ?  Spouse name: Not on file  ? Number of children: 2  ? Years of education: Not on file  ? Highest education level: Not on file  ?Occupational History  ? Occupation: retired Pharmacist, hospital  ?Tobacco Use  ? Smoking status: Never  ? Smokeless tobacco: Never  ?Vaping Use  ? Vaping Use: Never used  ?Substance and Sexual Activity  ? Alcohol use: No  ?  Alcohol/week: 0.0 standard drinks  ? Drug use: No  ? Sexual activity: Yes  ?Other Topics Concern  ? Not on file  ?Social History Narrative  ? Regularly exercises. Retired and married.   ? ?Social Determinants of Health  ? ?Financial Resource Strain: Not on file  ?Food Insecurity: Not on file  ?Transportation Needs: Not on file  ?Physical Activity: Not on file  ?Stress: Not on file  ?Social Connections: Not on file  ? ? ? ?Review of Systems  ?Constitutional:  Negative for appetite change and unexpected weight change.  ?HENT:  Negative for congestion and sinus pressure.   ?Respiratory:  Negative for cough, chest tightness and shortness of breath.   ?Cardiovascular:  Negative  for chest pain, palpitations and leg swelling.  ?Gastrointestinal:  Negative for nausea and vomiting.  ?     Bloating. Intermittent episodes of diarrhea.   ?Genitourinary:  Negative for difficulty urinating and dysuria.  ?Musculoskeletal:  Negative for joint swelling and myalgias.  ?Skin:  Negative for color change and rash.  ?Neurological:  Negative for dizziness, light-headedness and headaches.  ?Psychiatric/Behavioral:  Negative for agitation and dysphoric mood.   ? ?   ?Objective:  ?  ? ?BP 124/70   Pulse 65   Temp 97.9 ?F (36.6 ?C)   Resp 16   Ht 5' (1.524 m)   Wt 177 lb (80.3 kg)   SpO2 97%   BMI 34.57 kg/m?  ?Wt Readings from Last 3 Encounters:  ?04/18/21 177 lb (80.3 kg)  ?12/18/20 172 lb 6.4 oz (78.2 kg)  ?08/07/20 174 lb 4 oz (79 kg)  ? ? ?Physical Exam ?Vitals reviewed.  ?Constitutional:   ?   General: She is not in acute distress. ?    Appearance: Normal appearance. She is well-developed.  ?HENT:  ?   Head: Normocephalic and atraumatic.  ?   Right Ear: External ear normal.  ?   Left Ear: External ear normal.  ?Eyes:  ?   General: No scleral icterus.    ?   Right eye: No discharge.     ?   Left eye: No discharge.  ?   Conjunctiva/sclera: Conjunctivae normal.  ?Neck:  ?   Thyroid: No thyromegaly.  ?Cardiovascular:  ?   Rate and Rhythm: Normal rate and regular rhythm.  ?Pulmonary:  ?   Effort: No tachypnea, accessory muscle usage or respiratory distress.  ?   Breath sounds: Normal breath sounds. No decreased breath sounds or wheezing.  ?Chest:  ?Breasts: ?   Right: No inverted nipple, mass, nipple discharge or tenderness (no axillary adenopathy).  ?   Left: No inverted nipple, mass, nipple discharge or tenderness (no axilarry adenopathy).  ?Abdominal:  ?   General: Bowel sounds are normal.  ?   Palpations: Abdomen is soft.  ?   Tenderness: There is no abdominal tenderness.  ?   Comments: Umbilical hernia - easily reduced.  No significant discomfort with palpation.    ?Musculoskeletal:     ?   General: No swelling or tenderness.  ?   Cervical back: Neck supple. No tenderness.  ?Lymphadenopathy:  ?   Cervical: No cervical adenopathy.  ?Skin: ?   Findings: No erythema or rash.  ?Neurological:  ?   Mental Status: She is alert and oriented to person, place, and time.  ?Psychiatric:     ?   Mood and Affect: Mood normal.     ?   Behavior: Behavior normal.  ? ? ? ?Outpatient Encounter Medications as of 04/18/2021  ?Medication Sig  ? ACCU-CHEK GUIDE test strip USE TO TEST BLOOD SUGAR ONCE DAILY  ? blood glucose meter kit and supplies KIT Dispense based on patient and insurance preference. Use to check sugars once daily. Dx E11.9  ? Lancets (ONETOUCH ULTRASOFT) lancets Use to test blood sugars 1-2 times daily   E11.9  ? levothyroxine (SYNTHROID) 88 MCG tablet TAKE 1 TABLET BY MOUTH DAILY BEFORE BREAKFAST.  ? metFORMIN (GLUCOPHAGE-XR) 500 MG 24 hr tablet TAKE 1  TABLET BY MOUTH EVERY DAY WITH BREAKFAST  ? nystatin cream (MYCOSTATIN) Apply 1 application topically 2 (two) times daily.  ? omeprazole (PRILOSEC) 20 MG capsule TAKE 1 CAPSULE BY MOUTH EVERY DAY  ? polyethylene  glycol (MIRALAX / GLYCOLAX) packet Take 17 g by mouth daily. Mix one tablespoon with 8oz of your favorite juice or water every day until you are having soft formed stools. Then start taking once daily if you didn't have a stool the day before.  ? propranolol (INDERAL) 40 MG tablet TAKE 1 TABLET BY MOUTH TWICE A DAY  ? telmisartan (MICARDIS) 20 MG tablet TAKE 1 TABLET BY MOUTH EVERY DAY  ? [DISCONTINUED] Blood Glucose Monitoring Suppl (ONE TOUCH ULTRA SYSTEM KIT) W/DEVICE KIT 1 kit by Does not apply route once.  ? ?No facility-administered encounter medications on file as of 04/18/2021.  ?  ? ?Lab Results  ?Component Value Date  ? WBC 9.1 06/15/2020  ? HGB 14.1 06/15/2020  ? HCT 42.2 06/15/2020  ? PLT 233 06/15/2020  ? GLUCOSE 105 (H) 12/18/2020  ? CHOL 182 12/18/2020  ? TRIG 86.0 12/18/2020  ? HDL 54.80 12/18/2020  ? LDLDIRECT 156.0 12/06/2012  ? [place] 110 (H) 12/18/2020  ? ALT 11 12/18/2020  ? AST 11 12/18/2020  ? NA 140 12/18/2020  ? K 4.0 12/18/2020  ? CL 106 12/18/2020  ? CREATININE 0.73 12/18/2020  ? BUN 10 12/18/2020  ? CO2 27 12/18/2020  ? TSH 0.26 (L) 12/18/2020  ? HGBA1C 6.6 (H) 12/18/2020  ? MICROALBUR 1.0 04/02/2020  ? ? ?   ?Assessment & Plan:  ? ?Problem List Items Addressed This Visit   ? ? Abdominal bloating  ?  Saw GI.  Felt to be lactose intolerant. Trying to adjust diet.  Off probiotics.  Still with intermittent flares.  Discussed further GI w/up and evaluation.  Discussed overdue colonoscopy.  Agreeable for f/u with GI.  ?  ?  ? Aortic atherosclerosis ([place])  ?  Have discussed findings on CT scan.  Is off crestor.  Have discussed restarting statin. Wants to hold on restarting.   ?  ?  ? Diabetes ([place])  ?  Low carb diet and exercise.  Discussed diet and exercise.  Continue metformin XR.   Follow met b and a1c.  Check urine for microalbumin.   ?  ?  ? Relevant Orders  ? Hemoglobin A1c  ? Microalbumin / creatinine urine ratio  ? Dysuria  ?  Check urine to confirm no infection.  ?  ?  ? [facility] [employer] [facility]

## 2021-04-21 ENCOUNTER — Encounter: Payer: Self-pay | Admitting: Internal Medicine

## 2021-04-21 DIAGNOSIS — K429 Umbilical hernia without obstruction or gangrene: Secondary | ICD-10-CM | POA: Insufficient documentation

## 2021-04-21 NOTE — Assessment & Plan Note (Signed)
Blood pressure as outlined.  Now on micardis.  Follow pressures.  Follow metabolic panel. Cough/clearing improved with changing to micardis.   

## 2021-04-21 NOTE — Assessment & Plan Note (Signed)
Low carb diet and exercise.  Discussed diet and exercise.  Continue metformin XR.  Follow met b and a1c.  Check urine for microalbumin.   ?

## 2021-04-21 NOTE — Assessment & Plan Note (Signed)
Saw GI.  Felt to be lactose intolerant. Trying to adjust diet.  Off probiotics.  Still with intermittent flares.  Discussed further GI w/up and evaluation.  Discussed overdue colonoscopy.  Agreeable for f/u with GI.  ?

## 2021-04-21 NOTE — Assessment & Plan Note (Signed)
Recent CT - no acute abnormality. Previous pain - resolved. Seeing Dr Marius Ditch.  Has adjusted diet (recommended avoiding artificial sweeteners and lactose free diet).  Has improved some.  Still with intermittent flares.  Request f/u with Dr Marius Ditch.  Off probiotics - has helped.  Continue diet adjustment.  ?

## 2021-04-21 NOTE — Assessment & Plan Note (Signed)
Hernia as outlined.  Some intermittent pain and symptoms as outlined.  Refer to surgery for evaluation.  ?

## 2021-04-21 NOTE — Assessment & Plan Note (Signed)
Not on a statin.  Have discussed given history of diabetes, aortic atherosclerosis noted on CT - recommendation for statin medication.  She has declined.  Low cholesterol diet and exercise.  Follow lipid panel.  

## 2021-04-21 NOTE — Assessment & Plan Note (Signed)
Check urine to confirm no infection.  

## 2021-04-21 NOTE — Assessment & Plan Note (Signed)
Have discussed findings on CT scan.  Is off crestor.  Have discussed restarting statin. Wants to hold on restarting.   

## 2021-04-21 NOTE — Assessment & Plan Note (Signed)
On thyroid replacement.  Follow tsh.  

## 2021-04-30 DIAGNOSIS — H25012 Cortical age-related cataract, left eye: Secondary | ICD-10-CM | POA: Diagnosis not present

## 2021-04-30 DIAGNOSIS — Z961 Presence of intraocular lens: Secondary | ICD-10-CM | POA: Diagnosis not present

## 2021-04-30 DIAGNOSIS — H2512 Age-related nuclear cataract, left eye: Secondary | ICD-10-CM | POA: Diagnosis not present

## 2021-04-30 DIAGNOSIS — H18413 Arcus senilis, bilateral: Secondary | ICD-10-CM | POA: Diagnosis not present

## 2021-05-02 ENCOUNTER — Other Ambulatory Visit (INDEPENDENT_AMBULATORY_CARE_PROVIDER_SITE_OTHER): Payer: Medicare PPO

## 2021-05-02 DIAGNOSIS — E7800 Pure hypercholesterolemia, unspecified: Secondary | ICD-10-CM | POA: Diagnosis not present

## 2021-05-02 DIAGNOSIS — I10 Essential (primary) hypertension: Secondary | ICD-10-CM

## 2021-05-02 DIAGNOSIS — E1165 Type 2 diabetes mellitus with hyperglycemia: Secondary | ICD-10-CM

## 2021-05-02 LAB — LIPID PANEL
Cholesterol: 175 mg/dL (ref 0–200)
HDL: 50 mg/dL (ref >39.00)
LDL Cholesterol: 108 mg/dL — ABNORMAL HIGH (ref 0–99)
NonHDL: 124.61
Total CHOL/HDL Ratio: 3
Triglycerides: 82 mg/dL (ref 0.0–149.0)
VLDL: 16.4 mg/dL (ref 0.0–40.0)

## 2021-05-02 LAB — HEMOGLOBIN A1C: Hgb A1c MFr Bld: 7.1 % — ABNORMAL HIGH (ref 4.6–6.5)

## 2021-05-02 LAB — HEPATIC FUNCTION PANEL
ALT: 11 U/L (ref 0–35)
AST: 11 U/L (ref 0–37)
Albumin: 3.9 g/dL (ref 3.5–5.2)
Alkaline Phosphatase: 64 U/L (ref 39–117)
Bilirubin, Direct: 0.1 mg/dL (ref 0.0–0.3)
Total Bilirubin: 0.4 mg/dL (ref 0.2–1.2)
Total Protein: 6.5 g/dL (ref 6.0–8.3)

## 2021-05-02 LAB — CBC WITH DIFFERENTIAL/PLATELET
Basophils Absolute: 0.1 10*3/uL (ref 0.0–0.1)
Basophils Relative: 1 % (ref 0.0–3.0)
Eosinophils Absolute: 0.2 10*3/uL (ref 0.0–0.7)
Eosinophils Relative: 2.4 % (ref 0.0–5.0)
HCT: 40.2 % (ref 36.0–46.0)
Hemoglobin: 13.3 g/dL (ref 12.0–15.0)
Lymphocytes Relative: 24.3 % (ref 12.0–46.0)
Lymphs Abs: 1.9 10*3/uL (ref 0.7–4.0)
MCHC: 33 g/dL (ref 30.0–36.0)
MCV: 90.3 fl (ref 78.0–100.0)
Monocytes Absolute: 0.5 10*3/uL (ref 0.1–1.0)
Monocytes Relative: 6.9 % (ref 3.0–12.0)
Neutro Abs: 5 10*3/uL (ref 1.4–7.7)
Neutrophils Relative %: 65.4 % (ref 43.0–77.0)
Platelets: 249 10*3/uL (ref 150.0–400.0)
RBC: 4.45 Mil/uL (ref 3.87–5.11)
RDW: 13.4 % (ref 11.5–15.5)
WBC: 7.7 10*3/uL (ref 4.0–10.5)

## 2021-05-02 LAB — BASIC METABOLIC PANEL
BUN: 10 mg/dL (ref 6–23)
CO2: 28 mEq/L (ref 19–32)
Calcium: 8.7 mg/dL (ref 8.4–10.5)
Chloride: 107 mEq/L (ref 96–112)
Creatinine, Ser: 0.7 mg/dL (ref 0.40–1.20)
GFR: 82.2 mL/min (ref >60.00)
Glucose, Bld: 118 mg/dL — ABNORMAL HIGH (ref 70–99)
Potassium: 3.8 mEq/L (ref 3.5–5.1)
Sodium: 141 mEq/L (ref 135–145)

## 2021-05-02 LAB — MICROALBUMIN / CREATININE URINE RATIO
Creatinine,U: 105.4 mg/dL
Microalb Creat Ratio: 1.1 mg/g (ref 0.0–30.0)
Microalb, Ur: 1.2 mg/dL (ref 0.0–1.9)

## 2021-05-06 ENCOUNTER — Other Ambulatory Visit: Payer: Self-pay

## 2021-05-06 MED ORDER — METFORMIN HCL ER 500 MG PO TB24: ORAL_TABLET | ORAL | 1 refills | Status: DC

## 2021-05-27 DIAGNOSIS — E113393 Type 2 diabetes mellitus with moderate nonproliferative diabetic retinopathy without macular edema, bilateral: Secondary | ICD-10-CM | POA: Diagnosis not present

## 2021-05-28 ENCOUNTER — Ambulatory Visit
Admission: RE | Admit: 2021-05-28 | Discharge: 2021-05-28 | Disposition: A | Discharge status: Home / Self Care | Payer: Medicare PPO | Source: Ambulatory Visit | Attending: Internal Medicine | Admitting: Internal Medicine

## 2021-05-28 DIAGNOSIS — Z1231 Encounter for screening mammogram for malignant neoplasm of breast: Secondary | ICD-10-CM | POA: Diagnosis not present

## 2021-06-07 ENCOUNTER — Encounter: Payer: Self-pay | Admitting: Internal Medicine

## 2021-06-07 ENCOUNTER — Telehealth (INDEPENDENT_AMBULATORY_CARE_PROVIDER_SITE_OTHER): Payer: Medicare PPO | Admitting: Internal Medicine

## 2021-06-07 DIAGNOSIS — B029 Zoster without complications: Secondary | ICD-10-CM | POA: Diagnosis not present

## 2021-06-07 MED ORDER — FAMCICLOVIR 500 MG PO TABS: 500.0000 mg | ORAL_TABLET | Freq: Three times a day (TID) | ORAL | 0 refills | Status: DC

## 2021-06-07 NOTE — Progress Notes (Signed)
Virtual Visit via [place]  Note  This visit type was conducted due to national recommendations for restrictions regarding the COVID-19 pandemic (e.g. social distancing).  This format is felt to be most appropriate for this patient at this time.  All issues noted in this document were discussed and addressed.  No physical exam was performed (except for noted visual exam findings with Video Visits).   I connected withNAME@ on 06/07/21 at  4:45 PM EDT by a video enabled telemedicine application  and verified that I am speaking with the correct person using two identifiers. Location patient: home Location provider: work or home office Persons participating in the virtual visit: patient, provider  I discussed the limitations, risks, security and privacy concerns of performing an evaluation and management service by telephone and the availability of in person appointments. I also discussed with the patient that there may be a patient responsible charge related to this service. The patient expressed understanding and agreed to proceed.  Reason for visit: unilateral rash   HPI:  81 yr old female with type 2 DM, hypertension and hypothyroidism presents with a 2 day history of vesicular rash that occurred first on the right side of her face under her eye , then in the crease between her lower lip and chin.  The rash has been mildly painful described as stinging .  This morning she developed another blistering spot on her right shoulder.    She has no history of yard work or Museum/gallery exhibitions officer with plants (no pets) . She has not had the new Shingrix vaccine    ROS: See pertinent positives and negatives per HPI.  Past Medical History:  Diagnosis Date   Diverticulosis    GERD (gastroesophageal reflux disease)    Hiatal hernia    History of migraine headaches    Hypercholesterolemia    Hyperglycemia    Hypertension    Hypothyroidism    s/p removal of right thyroid lobe and isthmus (1992)     Past Surgical History:  Procedure Laterality Date   BREAST EXCISIONAL BIOPSY Right 2000   benign   THYROID LOBECTOMY  1992   s/p removal of right thyroid lobe and isthmus    Family History  Problem Relation Age of Onset   Coronary artery disease Mother        myocardial infarction and CHF   Breast cancer Mother        85's   Breast cancer Maternal Aunt        x 2   Breast cancer Sister        20's   Breast cancer Cousin        maternal side   Breast cancer Other 46   Colon cancer Neg Hx     SOCIAL HX:  reports that she has never smoked. She has never used smokeless tobacco. She reports that she does not drink alcohol and does not use drugs.   Current Outpatient Medications:    ACCU-CHEK GUIDE test strip, USE TO TEST BLOOD SUGAR ONCE DAILY, Disp: 100 strip, Rfl: 3   blood glucose meter kit and supplies KIT, Dispense based on patient and insurance preference. Use to check sugars once daily. Dx E11.9, Disp: 1 each, Rfl: 0   famciclovir (FAMVIR) 500 MG tablet, Take 1 tablet (500 mg total) by mouth 3 (three) times daily., Disp: 21 tablet, Rfl: 0   Lancets (ONETOUCH ULTRASOFT) lancets, Use to test blood sugars 1-2 times daily   E11.9, Disp: 100 each, Rfl:  12   levothyroxine (SYNTHROID) 88 MCG tablet, TAKE 1 TABLET BY MOUTH DAILY BEFORE BREAKFAST., Disp: 90 tablet, Rfl: 1   metFORMIN (GLUCOPHAGE-XR) 500 MG 24 hr tablet, Take one tablet twice a day, Disp: 180 tablet, Rfl: 1   nystatin cream (MYCOSTATIN), Apply 1 application topically 2 (two) times daily., Disp: 30 g, Rfl: 0   omeprazole (PRILOSEC) 20 MG capsule, TAKE 1 CAPSULE BY MOUTH EVERY DAY, Disp: 90 capsule, Rfl: 0   polyethylene glycol (MIRALAX / GLYCOLAX) packet, Take 17 g by mouth daily. Mix one tablespoon with 8oz of your favorite juice or water every day until you are having soft formed stools. Then start taking once daily if you didn't have a stool the day before., Disp: 30 each, Rfl: 0   propranolol (INDERAL) 40 MG  tablet, TAKE 1 TABLET BY MOUTH TWICE A DAY, Disp: 180 tablet, Rfl: 3   telmisartan (MICARDIS) 20 MG tablet, TAKE 1 TABLET BY MOUTH EVERY DAY, Disp: 90 tablet, Rfl: 1  EXAM:  VITALS per patient if applicable:  GENERAL: alert, oriented, appears well and in no acute distress  HEENT: atraumatic, conjunttiva clear, no obvious abnormalities on inspection of external nose and ears  NECK: normal movements of the head and neck  LUNGS: on inspection no signs of respiratory distress, breathing rate appears normal, no obvious gross SOB, gasping or wheezing  CV: no obvious cyanosis  MS: moves all visible extremities without noticeable abnormality  Skin: vesicular rash , herpetiform grouping on face.   PSYCH/NEURO: pleasant and cooperative, no obvious depression or anxiety, speech and thought processing grossly intact  ASSESSMENT AND PLAN:  Discussed the following assessment and plan:  Herpes zoster without complication  Shingles rash Affecting right shoulder and right side of face.  48 hours ago.  Advised to start famciclovir , sent to pharmacy    I discussed the assessment and treatment plan with the patient. The patient was provided an opportunity to ask questions and all were answered. The patient agreed with the plan and demonstrated an understanding of the instructions.   The patient was advised to call back or seek an in-person evaluation if the symptoms worsen or if the condition fails to improve as anticipated.   I spent 20 minutes dedicated to the care of this patient on the date of this encounter to include pre-visit review of her medical history,  Face-to-face time with the patient , and post visit ordering of testing and therapeutics.    Crecencio Mc, MD

## 2021-06-07 NOTE — Assessment & Plan Note (Signed)
Affecting right shoulder and right side of face.  48 hours ago.  Advised to start famciclovir , sent to pharmacy

## 2021-07-05 DIAGNOSIS — H2512 Age-related nuclear cataract, left eye: Secondary | ICD-10-CM | POA: Diagnosis not present

## 2021-07-19 ENCOUNTER — Encounter: Payer: Self-pay | Admitting: Internal Medicine

## 2021-07-19 ENCOUNTER — Ambulatory Visit: Payer: Medicare PPO | Admitting: Internal Medicine

## 2021-07-19 VITALS — BP 134/80 | HR 67 | Temp 98.5°F | Resp 18 | Ht 60.0 in | Wt 177.6 lb

## 2021-07-19 DIAGNOSIS — K429 Umbilical hernia without obstruction or gangrene: Secondary | ICD-10-CM | POA: Diagnosis not present

## 2021-07-19 DIAGNOSIS — E2839 Other primary ovarian failure: Secondary | ICD-10-CM

## 2021-07-19 DIAGNOSIS — I10 Essential (primary) hypertension: Secondary | ICD-10-CM | POA: Diagnosis not present

## 2021-07-19 DIAGNOSIS — I700 Atherosclerosis of aorta: Secondary | ICD-10-CM

## 2021-07-19 DIAGNOSIS — R140 Abdominal distension (gaseous): Secondary | ICD-10-CM

## 2021-07-19 DIAGNOSIS — E7800 Pure hypercholesterolemia, unspecified: Secondary | ICD-10-CM | POA: Diagnosis not present

## 2021-07-19 DIAGNOSIS — E1165 Type 2 diabetes mellitus with hyperglycemia: Secondary | ICD-10-CM | POA: Diagnosis not present

## 2021-07-19 DIAGNOSIS — E039 Hypothyroidism, unspecified: Secondary | ICD-10-CM | POA: Diagnosis not present

## 2021-07-19 DIAGNOSIS — M25552 Pain in left hip: Secondary | ICD-10-CM

## 2021-07-19 DIAGNOSIS — R195 Other fecal abnormalities: Secondary | ICD-10-CM

## 2021-07-19 MED ORDER — NYSTATIN 100000 UNIT/GM EX CREA: 1.0000 | TOPICAL_CREAM | Freq: Two times a day (BID) | CUTANEOUS | 0 refills | Status: DC

## 2021-07-19 MED ORDER — PROPRANOLOL HCL 40 MG PO TABS: 40.0000 mg | ORAL_TABLET | Freq: Two times a day (BID) | ORAL | 3 refills | Status: DC

## 2021-07-19 MED ORDER — METFORMIN HCL ER 500 MG PO TB24: ORAL_TABLET | ORAL | 1 refills | Status: DC

## 2021-07-19 MED ORDER — TELMISARTAN 20 MG PO TABS: 20.0000 mg | ORAL_TABLET | Freq: Every day | ORAL | 3 refills | Status: DC

## 2021-07-19 MED ORDER — LEVOTHYROXINE SODIUM 88 MCG PO TABS: 88.0000 ug | ORAL_TABLET | Freq: Every day | ORAL | 1 refills | Status: DC

## 2021-07-19 MED ORDER — OMEPRAZOLE 20 MG PO CPDR: DELAYED_RELEASE_CAPSULE | ORAL | 1 refills | Status: DC

## 2021-07-19 NOTE — Progress Notes (Signed)
Patient ID: Veronica Branch, female   DOB: 12-31-1940, 81 y.o.   MRN: [id number]   Subjective:    Patient ID: Veronica Branch, female    DOB: 01-17-1941, 81 y.o.   MRN: [id number]   Patient here for a scheduled follow up.   Chief Complaint  Patient presents with   Hypertension   Diabetes   Hyperlipidemia   .   HPI Recent cataract surgery.  Also was seen 06/07/21 - diagnosed with shingles.  Treated with famvir.  Previous chronic diarrhea.  Was advised to avoid artificial sweeteners.  Adjusted diet.  Bowels are some better.  No nausea or vomiting.  No increased acid reflux.  Occasionally will require TUMS.  Taking metformin - one in am and two in the pm.  No chest pain or sob reported.  Some left groin/knee/back issues at times.  Discussed exercise - stretches.  AM sugars 110-112.  Sees Dr Gloriann Loan for eye exam.    Past Medical History:  Diagnosis Date   Diverticulosis    GERD (gastroesophageal reflux disease)    Hiatal hernia    History of migraine headaches    Hypercholesterolemia    Hyperglycemia    Hypertension    Hypothyroidism    s/p removal of right thyroid lobe and isthmus (1992)   Past Surgical History:  Procedure Laterality Date   BREAST EXCISIONAL BIOPSY Right 2000   benign   THYROID LOBECTOMY  1992   s/p removal of right thyroid lobe and isthmus   Family History  Problem Relation Age of Onset   Coronary artery disease Mother        myocardial infarction and CHF   Breast cancer Mother        7's   Breast cancer Maternal Aunt        x 2   Breast cancer Sister        42's   Breast cancer Cousin        maternal side   Breast cancer Other 20   Colon cancer Neg Hx    Social History   Socioeconomic History   Marital status: Married    Spouse name: Not on file   Number of children: 2   Years of education: Not on file   Highest education level: Not on file  Occupational History   Occupation: retired Pharmacist, hospital  Tobacco Use   Smoking status: Never    Smokeless tobacco: Never  Vaping Use   Vaping Use: Never used  Substance and Sexual Activity   Alcohol use: No    Alcohol/week: 0.0 standard drinks of alcohol   Drug use: No   Sexual activity: Yes  Other Topics Concern   Not on file  Social History Narrative   Regularly exercises. Retired and married.    Social Determinants of Health   Financial Resource Strain: Not on file  Food Insecurity: Not on file  Transportation Needs: Not on file  Physical Activity: Not on file  Stress: Not on file  Social Connections: Not on file     Review of Systems  Constitutional:  Negative for appetite change and unexpected weight change.  HENT:  Negative for congestion and sinus pressure.   Respiratory:  Negative for cough, chest tightness and shortness of breath.   Cardiovascular:  Negative for chest pain, palpitations and leg swelling.  Gastrointestinal:  Negative for abdominal pain, diarrhea, nausea and vomiting.  Genitourinary:  Negative for difficulty urinating and dysuria.  Musculoskeletal:  Negative for joint swelling and myalgias.  Skin:  Negative for color change and rash.  Neurological:  Negative for dizziness, light-headedness and headaches.  Psychiatric/Behavioral:  Negative for agitation and dysphoric mood.        Objective:     BP 134/80 (BP Location: Left Arm, Patient Position: Sitting, Cuff Size: Small)   Pulse 67   Temp 98.5 F (36.9 C) (Temporal)   Resp 18   Ht 5' (1.524 m)   Wt 177 lb 9.6 oz (80.6 kg)   SpO2 94%   BMI 34.69 kg/m  Wt Readings from Last 3 Encounters:  07/19/21 177 lb 9.6 oz (80.6 kg)  06/07/21 177 lb (80.3 kg)  04/18/21 177 lb (80.3 kg)    Physical Exam Vitals reviewed.  Constitutional:      General: She is not in acute distress.    Appearance: Normal appearance.  HENT:     Head: Normocephalic and atraumatic.     Right Ear: External ear normal.     Left Ear: External ear normal.  Eyes:     General: No scleral icterus.       Right eye:  No discharge.        Left eye: No discharge.     Conjunctiva/sclera: Conjunctivae normal.  Neck:     Thyroid: No thyromegaly.  Cardiovascular:     Rate and Rhythm: Normal rate and regular rhythm.  Pulmonary:     Effort: No respiratory distress.     Breath sounds: Normal breath sounds. No wheezing.  Abdominal:     General: Bowel sounds are normal.     Palpations: Abdomen is soft.     Tenderness: There is no abdominal tenderness.  Musculoskeletal:        General: No swelling or tenderness.     Cervical back: Neck supple. No tenderness.  Lymphadenopathy:     Cervical: No cervical adenopathy.  Skin:    Findings: No erythema or rash.  Neurological:     Mental Status: She is alert.  Psychiatric:        Mood and Affect: Mood normal.        Behavior: Behavior normal.      Outpatient Encounter Medications as of 07/19/2021  Medication Sig   ACCU-CHEK GUIDE test strip USE TO TEST BLOOD SUGAR ONCE DAILY   blood glucose meter kit and supplies KIT Dispense based on patient and insurance preference. Use to check sugars once daily. Dx E11.9   famciclovir (FAMVIR) 500 MG tablet Take 1 tablet (500 mg total) by mouth 3 (three) times daily.   Lancets (ONETOUCH ULTRASOFT) lancets Use to test blood sugars 1-2 times daily   E11.9   levothyroxine (SYNTHROID) 88 MCG tablet Take 1 tablet (88 mcg total) by mouth daily before breakfast.   metFORMIN (GLUCOPHAGE-XR) 500 MG 24 hr tablet Take one tablet twice a day   nystatin cream (MYCOSTATIN) Apply 1 Application topically 2 (two) times daily.   omeprazole (PRILOSEC) 20 MG capsule TAKE 1 CAPSULE BY MOUTH EVERY DAY   polyethylene glycol (MIRALAX / GLYCOLAX) packet Take 17 g by mouth daily. Mix one tablespoon with 8oz of your favorite juice or water every day until you are having soft formed stools. Then start taking once daily if you didn't have a stool the day before.   propranolol (INDERAL) 40 MG tablet Take 1 tablet (40 mg total) by mouth 2 (two) times  daily.   telmisartan (MICARDIS) 20 MG tablet Take 1 tablet (20 mg total) by mouth daily.   [DISCONTINUED] Blood Glucose Monitoring  Suppl (ONE TOUCH ULTRA SYSTEM KIT) W/DEVICE KIT 1 kit by Does not apply route once.   [DISCONTINUED] levothyroxine (SYNTHROID) 88 MCG tablet TAKE 1 TABLET BY MOUTH DAILY BEFORE BREAKFAST.   [DISCONTINUED] metFORMIN (GLUCOPHAGE-XR) 500 MG 24 hr tablet Take one tablet twice a day   [DISCONTINUED] nystatin cream (MYCOSTATIN) Apply 1 application topically 2 (two) times daily.   [DISCONTINUED] omeprazole (PRILOSEC) 20 MG capsule TAKE 1 CAPSULE BY MOUTH EVERY DAY   [DISCONTINUED] propranolol (INDERAL) 40 MG tablet TAKE 1 TABLET BY MOUTH TWICE A DAY   [DISCONTINUED] telmisartan (MICARDIS) 20 MG tablet TAKE 1 TABLET BY MOUTH EVERY DAY   No facility-administered encounter medications on file as of 07/19/2021.     Lab Results  Component Value Date   WBC 7.7 05/02/2021   HGB 13.3 05/02/2021   HCT 40.2 05/02/2021   PLT 249.0 05/02/2021   GLUCOSE 118 (H) 05/02/2021   CHOL 175 05/02/2021   TRIG 82.0 05/02/2021   HDL 50.00 05/02/2021   LDLDIRECT 156.0 12/06/2012   LDLCALC 108 (H) 05/02/2021   ALT 11 05/02/2021   AST 11 05/02/2021   NA 141 05/02/2021   K 3.8 05/02/2021   CL 107 05/02/2021   CREATININE 0.70 05/02/2021   BUN 10 05/02/2021   CO2 28 05/02/2021   TSH 0.26 (L) 12/18/2020   HGBA1C 7.1 (H) 05/02/2021   MICROALBUR 1.2 05/02/2021    MM 3D SCREEN BREAST BILATERAL  Result Date: 05/28/2021 CLINICAL DATA:  Screening. EXAM: DIGITAL SCREENING BILATERAL MAMMOGRAM WITH TOMOSYNTHESIS AND CAD TECHNIQUE: Bilateral screening digital craniocaudal and mediolateral oblique mammograms were obtained. Bilateral screening digital breast tomosynthesis was performed. The images were evaluated with computer-aided detection. COMPARISON:  Previous exam(s). ACR Breast Density Category b: There are scattered areas of fibroglandular density. FINDINGS: There are no findings suspicious  for malignancy. IMPRESSION: No mammographic evidence of malignancy. A result letter of this screening mammogram will be mailed directly to the patient. RECOMMENDATION: Screening mammogram in one year. (Code:SM-B-01Y) BI-RADS CATEGORY  1: Negative. Electronically Signed   By: Marin Olp M.D.   On: 05/28/2021 16:05       Assessment & Plan:   Problem List Items Addressed This Visit     Abdominal bloating    Saw GI.  Felt to be lactose intolerant. Trying to adjust diet. Discussed avoiding artificial sweeteners.  Discussed further GI w/up and evaluation.  Discussed overdue colonoscopy.  Agreeable for f/u with GI.       Aortic atherosclerosis ([place])    Had been on crestor.  Needs to restart.  Have discussed.       Relevant Medications   propranolol (INDERAL) 40 MG tablet   telmisartan (MICARDIS) 20 MG tablet   Diabetes ([facility])    Low carb diet and exercise.  Discussed diet and exercise.  Continue metformin XR.  Follow met b and a1c.      Relevant Medications   metFORMIN (GLUCOPHAGE-XR) 500 MG 24 hr tablet   telmisartan (MICARDIS) 20 MG tablet   Other Relevant Orders   HgB X3K   Hernia, umbilical    Hernia as outlined.  Some intermittent pain and symptoms as outlined.  Have discussed referal to surgery for evaluation. Notify me when desires referral.       Hypercholesterolemia - Primary    Not on a statin.  Have discussed given history of diabetes, aortic atherosclerosis noted on CT - recommendation for statin medication.  She has declined.  Low cholesterol diet and exercise.  Follow lipid panel.  Relevant Medications   propranolol (INDERAL) 40 MG tablet   telmisartan (MICARDIS) 20 MG tablet   Other Relevant Orders   Hepatic function panel   Lipid Profile   Hypertension    Blood pressure as outlined.  On micardis.  Follow pressures.  Follow metabolic panel.       Relevant Medications   propranolol (INDERAL) 40 MG tablet   telmisartan (MICARDIS) 20 MG tablet    Hypothyroidism    On thyroid replacement.  Follow tsh.       Relevant Medications   levothyroxine (SYNTHROID) 88 MCG tablet   propranolol (INDERAL) 40 MG tablet   Other Relevant Orders   TSH   Left hip pain    Left groin/knee/back issues as outlined.  Discussed stretches/PT, etc.  Follow.  Notify me if desires further intervention.       Loose stools    Recent CT - no acute abnormality.  Seeing Dr Marius Ditch.  Has adjusted diet (recommended avoiding artificial sweeteners and lactose free diet).  Has improved some.  Still with intermittent flares.  Request f/u with Dr Marius Ditch.  Off probiotics - has helped.  Continue diet adjustment. Refer back for evaluation and question of need for colonoscopy - overdue.       Other Visit Diagnoses     Essential hypertension       Relevant Medications   propranolol (INDERAL) 40 MG tablet   telmisartan (MICARDIS) 20 MG tablet   Other Relevant Orders   Basic Metabolic Panel (BMET)   Estrogen deficiency       Relevant Orders   DG Bone Density        Einar Pheasant, MD

## 2021-07-28 ENCOUNTER — Telehealth: Payer: Self-pay | Admitting: Internal Medicine

## 2021-07-28 ENCOUNTER — Encounter: Payer: Self-pay | Admitting: Internal Medicine

## 2021-07-28 NOTE — Assessment & Plan Note (Signed)
Saw GI.  Felt to be lactose intolerant. Trying to adjust diet. Discussed avoiding artificial sweeteners.  Discussed further GI w/up and evaluation.  Discussed overdue colonoscopy.  Agreeable for f/u with GI.

## 2021-07-28 NOTE — Assessment & Plan Note (Signed)
Hernia as outlined.  Some intermittent pain and symptoms as outlined.  Have discussed referal to surgery for evaluation. Notify me when desires referral.

## 2021-07-28 NOTE — Telephone Encounter (Signed)
Sees Dr Allegra Lai - [facility].  Being followed for loose stools, etc.  Needs f/u with her.  Also overdue colonoscopy. Please schedule f/u appt with Dr Allegra Lai.

## 2021-07-28 NOTE — Assessment & Plan Note (Signed)
Had been on crestor.  Needs to restart.  Have discussed.

## 2021-07-28 NOTE — Assessment & Plan Note (Signed)
Blood pressure as outlined. On micardis.  Follow pressures.  Follow metabolic panel.  

## 2021-07-28 NOTE — Assessment & Plan Note (Signed)
Recent CT - no acute abnormality.  Seeing Dr Allegra Lai.  Has adjusted diet (recommended avoiding artificial sweeteners and lactose free diet).  Has improved some.  Still with intermittent flares.  Request f/u with Dr Allegra Lai.  Off probiotics - has helped.  Continue diet adjustment. Refer back for evaluation and question of need for colonoscopy - overdue.

## 2021-07-28 NOTE — Assessment & Plan Note (Signed)
Low carb diet and exercise.  Discussed diet and exercise.  Continue metformin XR.  Follow met b and a1c.

## 2021-07-28 NOTE — Assessment & Plan Note (Signed)
Not on a statin.  Have discussed given history of diabetes, aortic atherosclerosis noted on CT - recommendation for statin medication.  She has declined.  Low cholesterol diet and exercise.  Follow lipid panel.

## 2021-07-28 NOTE — Assessment & Plan Note (Signed)
On thyroid replacement.  Follow tsh.  

## 2021-07-28 NOTE — Assessment & Plan Note (Signed)
Left groin/knee/back issues as outlined.  Discussed stretches/PT, etc.  Follow.  Notify me if desires further intervention.

## 2021-07-29 ENCOUNTER — Telehealth: Payer: Self-pay | Admitting: Internal Medicine

## 2021-07-29 NOTE — Telephone Encounter (Signed)
Copied from CRM [id number]. Topic: Medicare AWV >> Jul 29, 2021 11:11 AM Payton Doughty wrote: Reason for CRM: Left message for patient to schedule Annual Wellness Visit.  Please schedule with Nurse Health Advisor Denisa O'Brien-Blaney, LPN at [facility]. This appt can be telephone or office visit.  Please call [phone] ask for [facility]

## 2021-08-01 ENCOUNTER — Telehealth: Payer: Medicare PPO | Admitting: Family Medicine

## 2021-08-01 ENCOUNTER — Telehealth: Payer: Self-pay

## 2021-08-01 DIAGNOSIS — L02419 Cutaneous abscess of limb, unspecified: Secondary | ICD-10-CM | POA: Diagnosis not present

## 2021-08-01 DIAGNOSIS — L03119 Cellulitis of unspecified part of limb: Secondary | ICD-10-CM | POA: Diagnosis not present

## 2021-08-01 DIAGNOSIS — Z91199 Patient's noncompliance with other medical treatment and regimen due to unspecified reason: Secondary | ICD-10-CM

## 2021-08-01 NOTE — Telephone Encounter (Signed)
I called patient and tried to get her scheduled for AWV. Pt declined & stated that she does her annuals with Dr. Lorin Picket & she felt that was sufficient.

## 2021-08-01 NOTE — Progress Notes (Signed)
Called patient multiple times at appt time, sent links to numbers and email in chart and waited in virtual space until 10 minutes passed appointment. Tried one more time to reach patient at 12 minutes passed appt time. Left multiple voice messages and advised in final messages since could not reach her for her appt that she call the office in the morning to reschedule or utilize the [place] virtual visits or [facility] if immediate care this evening was needed.

## 2021-08-01 NOTE — Telephone Encounter (Signed)
Pt scheduled for first available with Dr Meredith Mody - 11/27 at 230pm LM for pt to cb

## 2021-09-10 ENCOUNTER — Other Ambulatory Visit (INDEPENDENT_AMBULATORY_CARE_PROVIDER_SITE_OTHER): Payer: Medicare PPO

## 2021-09-10 DIAGNOSIS — E1165 Type 2 diabetes mellitus with hyperglycemia: Secondary | ICD-10-CM

## 2021-09-10 DIAGNOSIS — E7800 Pure hypercholesterolemia, unspecified: Secondary | ICD-10-CM

## 2021-09-10 DIAGNOSIS — E039 Hypothyroidism, unspecified: Secondary | ICD-10-CM

## 2021-09-10 DIAGNOSIS — I10 Essential (primary) hypertension: Secondary | ICD-10-CM | POA: Diagnosis not present

## 2021-09-10 LAB — HEPATIC FUNCTION PANEL
ALT: 12 U/L (ref 0–35)
AST: 11 U/L (ref 0–37)
Albumin: 4.2 g/dL (ref 3.5–5.2)
Alkaline Phosphatase: 64 U/L (ref 39–117)
Bilirubin, Direct: 0.1 mg/dL (ref 0.0–0.3)
Total Bilirubin: 0.6 mg/dL (ref 0.2–1.2)
Total Protein: 6.5 g/dL (ref 6.0–8.3)

## 2021-09-10 LAB — LIPID PANEL
Cholesterol: 176 mg/dL (ref 0–200)
HDL: 49.8 mg/dL (ref >39.00)
LDL Cholesterol: 106 mg/dL — ABNORMAL HIGH (ref 0–99)
NonHDL: 126.34
Total CHOL/HDL Ratio: 4
Triglycerides: 100 mg/dL (ref 0.0–149.0)
VLDL: 20 mg/dL (ref 0.0–40.0)

## 2021-09-10 LAB — BASIC METABOLIC PANEL
BUN: 9 mg/dL (ref 6–23)
CO2: 27 mEq/L (ref 19–32)
Calcium: 9.1 mg/dL (ref 8.4–10.5)
Chloride: 105 mEq/L (ref 96–112)
Creatinine, Ser: 0.68 mg/dL (ref 0.40–1.20)
GFR: 82.56 mL/min (ref >60.00)
Glucose, Bld: 113 mg/dL — ABNORMAL HIGH (ref 70–99)
Potassium: 4.1 mEq/L (ref 3.5–5.1)
Sodium: 140 mEq/L (ref 135–145)

## 2021-09-10 LAB — HEMOGLOBIN A1C: Hgb A1c MFr Bld: 6.9 % — ABNORMAL HIGH (ref 4.6–6.5)

## 2021-09-10 LAB — TSH: TSH: 2.08 u[IU]/mL (ref 0.35–5.50)

## 2021-09-12 ENCOUNTER — Ambulatory Visit: Payer: Medicare PPO | Admitting: Internal Medicine

## 2021-09-12 ENCOUNTER — Encounter: Payer: Self-pay | Admitting: Internal Medicine

## 2021-09-12 ENCOUNTER — Ambulatory Visit (INDEPENDENT_AMBULATORY_CARE_PROVIDER_SITE_OTHER): Payer: Medicare PPO

## 2021-09-12 DIAGNOSIS — I700 Atherosclerosis of aorta: Secondary | ICD-10-CM | POA: Diagnosis not present

## 2021-09-12 DIAGNOSIS — E1165 Type 2 diabetes mellitus with hyperglycemia: Secondary | ICD-10-CM | POA: Diagnosis not present

## 2021-09-12 DIAGNOSIS — M25562 Pain in left knee: Secondary | ICD-10-CM

## 2021-09-12 DIAGNOSIS — E039 Hypothyroidism, unspecified: Secondary | ICD-10-CM

## 2021-09-12 DIAGNOSIS — E7800 Pure hypercholesterolemia, unspecified: Secondary | ICD-10-CM

## 2021-09-12 DIAGNOSIS — R195 Other fecal abnormalities: Secondary | ICD-10-CM | POA: Diagnosis not present

## 2021-09-12 DIAGNOSIS — I10 Essential (primary) hypertension: Secondary | ICD-10-CM

## 2021-09-12 NOTE — Assessment & Plan Note (Addendum)
The 10-year ASCVD risk score (Arnett DK, et al., 2019) is: 53.2%   Values used to calculate the score:     Age: 81 years     Sex: Female     Is Non-Hispanic African American: No     Diabetic: Yes     Tobacco smoker: No     Systolic Blood Pressure: 132 mmHg     Is BP treated: Yes     HDL Cholesterol: 49.8 mg/dL     Total Cholesterol: 176 mg/dL  Discussed calculated cholesterol risk.  Discussed recommendation to start cholesterol medication.  Declines at this time.  Low cholesterol diet and exercise.  Follow lipid panel.

## 2021-09-12 NOTE — Progress Notes (Signed)
Patient ID: Veronica Branch, female   DOB: February 13, 1940, 81 y.o.   MRN: [id number]   Subjective:    Patient ID: Veronica Branch, female    DOB: 29-Aug-1940, 81 y.o.   MRN: [id number]   Patient here for a scheduled follow up .   HPI Here to follow up regarding her blood pressure, cholesterol and diabetes.  States she is doing relatively well.  Does report left knee pain.  Feels like it is going to five away when walking.  Steps aggravate.  Persistent pain.  No chest pain.  Breathing stable.  Has adjusted her diet.  Has cut out increased artificial sweeteners.  Taking miralax.  Bowels are better.  Blood sugars - 107-114.     Past Medical History:  Diagnosis Date   Diverticulosis    GERD (gastroesophageal reflux disease)    Hiatal hernia    History of migraine headaches    Hypercholesterolemia    Hyperglycemia    Hypertension    Hypothyroidism    s/p removal of right thyroid lobe and isthmus (1992)   Past Surgical History:  Procedure Laterality Date   BREAST EXCISIONAL BIOPSY Right 2000   benign   THYROID LOBECTOMY  1992   s/p removal of right thyroid lobe and isthmus   Family History  Problem Relation Age of Onset   Coronary artery disease Mother        myocardial infarction and CHF   Breast cancer Mother        33's   Breast cancer Maternal Aunt        x 2   Breast cancer Sister        13's   Breast cancer Cousin        maternal side   Breast cancer Other 44   Colon cancer Neg Hx    Social History   Socioeconomic History   Marital status: Married    Spouse name: Not on file   Number of children: 2   Years of education: Not on file   Highest education level: Not on file  Occupational History   Occupation: retired Pharmacist, hospital  Tobacco Use   Smoking status: Never   Smokeless tobacco: Never  Vaping Use   Vaping Use: Never used  Substance and Sexual Activity   Alcohol use: No    Alcohol/week: 0.0 standard drinks of alcohol   Drug use: No   Sexual activity: Yes   Other Topics Concern   Not on file  Social History Narrative   Regularly exercises. Retired and married.    Social Determinants of Health   Financial Resource Strain: Not on file  Food Insecurity: Not on file  Transportation Needs: Not on file  Physical Activity: Not on file  Stress: Not on file  Social Connections: Not on file     Review of Systems  Constitutional:  Negative for appetite change and unexpected weight change.  HENT:  Negative for congestion and sinus pressure.   Respiratory:  Negative for cough, chest tightness and shortness of breath.   Cardiovascular:  Negative for chest pain, palpitations and leg swelling.  Gastrointestinal:  Negative for abdominal pain, diarrhea, nausea and vomiting.  Genitourinary:  Negative for difficulty urinating and dysuria.  Musculoskeletal:  Negative for myalgias.       Left knee pain as outlined.   Skin:  Negative for color change and rash.  Neurological:  Negative for dizziness, light-headedness and headaches.  Psychiatric/Behavioral:  Negative for agitation and dysphoric mood.  Objective:     BP 132/76 (BP Location: Left Arm, Patient Position: Sitting, Cuff Size: Large)   Pulse 66   Temp 98.4 F (36.9 C) (Temporal)   Resp 17   Ht 5' (1.524 m)   Wt 179 lb 6.4 oz (81.4 kg)   SpO2 98%   BMI 35.04 kg/m  Wt Readings from Last 3 Encounters:  09/12/21 179 lb 6.4 oz (81.4 kg)  07/19/21 177 lb 9.6 oz (80.6 kg)  06/07/21 177 lb (80.3 kg)    Physical Exam Vitals reviewed.  Constitutional:      General: She is not in acute distress.    Appearance: Normal appearance.  HENT:     Head: Normocephalic and atraumatic.     Right Ear: External ear normal.     Left Ear: External ear normal.  Eyes:     General: No scleral icterus.       Right eye: No discharge.        Left eye: No discharge.     Conjunctiva/sclera: Conjunctivae normal.  Neck:     Thyroid: No thyromegaly.  Cardiovascular:     Rate and Rhythm: Normal  rate and regular rhythm.  Pulmonary:     Effort: No respiratory distress.     Breath sounds: Normal breath sounds. No wheezing.  Abdominal:     General: Bowel sounds are normal.     Palpations: Abdomen is soft.     Tenderness: There is no abdominal tenderness.  Musculoskeletal:        General: No tenderness.     Cervical back: Neck supple. No tenderness.     Comments: Some minimal increased soft tissue - medial aspect of left knee.  Increased pain - medial aspect of knee.   Lymphadenopathy:     Cervical: No cervical adenopathy.  Skin:    Findings: No erythema or rash.  Neurological:     Mental Status: She is alert.  Psychiatric:        Mood and Affect: Mood normal.        Behavior: Behavior normal.      Outpatient Encounter Medications as of 09/12/2021  Medication Sig   ACCU-CHEK GUIDE test strip USE TO TEST BLOOD SUGAR ONCE DAILY   blood glucose meter kit and supplies KIT Dispense based on patient and insurance preference. Use to check sugars once daily. Dx E11.9   famciclovir (FAMVIR) 500 MG tablet Take 1 tablet (500 mg total) by mouth 3 (three) times daily.   Lancets (ONETOUCH ULTRASOFT) lancets Use to test blood sugars 1-2 times daily   E11.9   levothyroxine (SYNTHROID) 88 MCG tablet Take 1 tablet (88 mcg total) by mouth daily before breakfast.   metFORMIN (GLUCOPHAGE-XR) 500 MG 24 hr tablet Take one tablet twice a day   nystatin cream (MYCOSTATIN) Apply 1 Application topically 2 (two) times daily.   omeprazole (PRILOSEC) 20 MG capsule TAKE 1 CAPSULE BY MOUTH EVERY DAY   polyethylene glycol (MIRALAX / GLYCOLAX) packet Take 17 g by mouth daily. Mix one tablespoon with 8oz of your favorite juice or water every day until you are having soft formed stools. Then start taking once daily if you didn't have a stool the day before.   propranolol (INDERAL) 40 MG tablet Take 1 tablet (40 mg total) by mouth 2 (two) times daily.   telmisartan (MICARDIS) 20 MG tablet Take 1 tablet (20 mg  total) by mouth daily.   [DISCONTINUED] Blood Glucose Monitoring Suppl (ONE TOUCH ULTRA SYSTEM KIT) W/DEVICE KIT  1 kit by Does not apply route once.   No facility-administered encounter medications on file as of 09/12/2021.     Lab Results  Component Value Date   WBC 7.7 05/02/2021   HGB 13.3 05/02/2021   HCT 40.2 05/02/2021   PLT 249.0 05/02/2021   GLUCOSE 113 (H) 09/10/2021   CHOL 176 09/10/2021   TRIG 100.0 09/10/2021   HDL 49.80 09/10/2021   LDLDIRECT 156.0 12/06/2012   LDLCALC 106 (H) 09/10/2021   ALT 12 09/10/2021   AST 11 09/10/2021   NA 140 09/10/2021   K 4.1 09/10/2021   CL 105 09/10/2021   CREATININE 0.68 09/10/2021   BUN 9 09/10/2021   CO2 27 09/10/2021   TSH 2.08 09/10/2021   HGBA1C 6.9 (H) 09/10/2021   MICROALBUR 1.2 05/02/2021    MM 3D SCREEN BREAST BILATERAL  Result Date: 05/28/2021 CLINICAL DATA:  Screening. EXAM: DIGITAL SCREENING BILATERAL MAMMOGRAM WITH TOMOSYNTHESIS AND CAD TECHNIQUE: Bilateral screening digital craniocaudal and mediolateral oblique mammograms were obtained. Bilateral screening digital breast tomosynthesis was performed. The images were evaluated with computer-aided detection. COMPARISON:  Previous exam(s). ACR Breast Density Category b: There are scattered areas of fibroglandular density. FINDINGS: There are no findings suspicious for malignancy. IMPRESSION: No mammographic evidence of malignancy. A result letter of this screening mammogram will be mailed directly to the patient. RECOMMENDATION: Screening mammogram in one year. (Code:SM-B-01Y) BI-RADS CATEGORY  1: Negative. Electronically Signed   By: Marin Olp M.D.   On: 05/28/2021 16:05       Assessment & Plan:   Problem List Items Addressed This Visit     Aortic atherosclerosis ([place])    Had been on crestor. Discussed calculated cholesterol risk.  Declines.       Diabetes ([place])    Low carb diet and exercise.  Discussed diet and exercise.  Continue metformin XR.  Follow met b and  a1c. Recent a1c improved -  Lab Results  Component Value Date   HGBA1C 6.9 (H) 09/10/2021       Relevant Orders   Hemoglobin A1c   Hypercholesterolemia    The 10-year ASCVD risk score (Arnett DK, et al., 2019) is: 53.2%   Values used to calculate the score:     Age: 11 years     Sex: Female     Is Non-Hispanic African American: No     Diabetic: Yes     Tobacco smoker: No     Systolic Blood Pressure: 010 mmHg     Is BP treated: Yes     HDL Cholesterol: 49.8 mg/dL     Total Cholesterol: 176 mg/dL  Discussed calculated cholesterol risk.  Discussed recommendation to start cholesterol medication.  Declines at this time.  Low cholesterol diet and exercise.  Follow lipid panel.       Relevant Orders   Hepatic function panel   Lipid panel   TSH   Hypertension    Blood pressure as outlined.  On micardis.  Follow pressures.  Follow metabolic panel.       Relevant Orders   Basic metabolic panel   Hypothyroidism    On thyroid replacement.  Follow tsh.       Left knee pain    Persistent knee pain.  Check xray.  Further w/up pending results.       Relevant Orders   DG Knee 1-2 Views Left (Completed)   Loose stools    Recent CT - no acute abnormality.  Seeing Dr Marius Ditch.  Has adjusted  diet (recommended avoiding artificial sweeteners and lactose free diet).  Has improved.         Einar Pheasant, MD

## 2021-09-14 ENCOUNTER — Encounter: Payer: Self-pay | Admitting: Internal Medicine

## 2021-09-14 NOTE — Assessment & Plan Note (Signed)
Recent CT - no acute abnormality.  Seeing Dr Allegra Lai.  Has adjusted diet (recommended avoiding artificial sweeteners and lactose free diet).  Has improved.

## 2021-09-14 NOTE — Assessment & Plan Note (Signed)
Blood pressure as outlined. On micardis.  Follow pressures.  Follow metabolic panel.  

## 2021-09-14 NOTE — Assessment & Plan Note (Signed)
Persistent knee pain.  Check xray.  Further w/up pending results.

## 2021-09-14 NOTE — Assessment & Plan Note (Signed)
Low carb diet and exercise.  Discussed diet and exercise.  Continue metformin XR.  Follow met b and a1c. Recent a1c improved -  Lab Results  Component Value Date   HGBA1C 6.9 (H) 09/10/2021

## 2021-09-14 NOTE — Assessment & Plan Note (Signed)
Had been on crestor. Discussed calculated cholesterol risk.  Declines.  

## 2021-09-14 NOTE — Assessment & Plan Note (Signed)
On thyroid replacement.  Follow tsh.  

## 2021-09-18 DIAGNOSIS — Z961 Presence of intraocular lens: Secondary | ICD-10-CM | POA: Diagnosis not present

## 2021-09-20 ENCOUNTER — Telehealth: Payer: Self-pay | Admitting: Internal Medicine

## 2021-09-20 NOTE — Telephone Encounter (Signed)
Copied from CRM [id number]. Topic: Medicare AWV >> Sep 20, 2021 10:15 AM Payton Doughty wrote: Reason for CRM: Left message for patient to schedule Annual Wellness Visit.  Please schedule with Nurse Health Advisor Denisa O'Brien-Blaney, LPN at [facility]. This appt can be telephone or office visit.  Please call [phone] ask for [facility]

## 2021-10-03 ENCOUNTER — Telehealth: Payer: Self-pay | Admitting: Internal Medicine

## 2021-10-03 NOTE — Telephone Encounter (Signed)
Spoke with patient spouse he req CB 10/2021

## 2021-10-16 DIAGNOSIS — M25562 Pain in left knee: Secondary | ICD-10-CM | POA: Diagnosis not present

## 2021-10-30 ENCOUNTER — Telehealth: Payer: Self-pay | Admitting: Internal Medicine

## 2021-10-30 NOTE — Telephone Encounter (Signed)
Copied from [place] [id number]. Topic: Medicare AWV >> Oct 30, 2021 11:56 AM Devoria Glassing wrote: Reason for CRM: Left message for patient to schedule Annual Wellness Visit.  Please schedule with Nurse Health Advisor Denisa O'Brien-Blaney, LPN at [facility]. This appt can be telephone or office visit.  Please call [phone] ask for [facility]

## 2021-11-07 DIAGNOSIS — H0015 Chalazion left lower eyelid: Secondary | ICD-10-CM | POA: Diagnosis not present

## 2021-11-25 ENCOUNTER — Telehealth: Payer: Self-pay | Admitting: Internal Medicine

## 2021-11-25 NOTE — Telephone Encounter (Signed)
Copied from [place] [id number]. Topic: Medicare AWV >> Nov 25, 2021  1:38 PM Devoria Glassing wrote: Reason for CRM: Left message for patient to schedule Annual Wellness Visit.  Please schedule with Nurse Health Advisor Denisa O'Brien-Blaney, LPN at [facility]. This appt can be telephone or office visit.  Please call [phone] ask for [facility]

## 2021-12-02 ENCOUNTER — Telehealth: Payer: Self-pay | Admitting: Internal Medicine

## 2021-12-02 NOTE — Telephone Encounter (Signed)
Copied from CRM [id number]. Topic: Medicare AWV >> Dec 02, 2021  9:34 AM Payton Doughty wrote: Reason for CRM: Left message for patient to schedule Annual Wellness Visit.  Please schedule with Nurse Health Advisor Denisa O'Brien-Blaney, LPN at [facility]. This appt can be telephone or office visit.  Please call [phone] ask for [facility]

## 2021-12-05 DIAGNOSIS — E113393 Type 2 diabetes mellitus with moderate nonproliferative diabetic retinopathy without macular edema, bilateral: Secondary | ICD-10-CM | POA: Diagnosis not present

## 2021-12-16 ENCOUNTER — Ambulatory Visit: Payer: Medicare PPO | Admitting: Gastroenterology

## 2021-12-24 ENCOUNTER — Ambulatory Visit (INDEPENDENT_AMBULATORY_CARE_PROVIDER_SITE_OTHER): Payer: Medicare PPO

## 2021-12-24 ENCOUNTER — Ambulatory Visit: Payer: Medicare PPO

## 2021-12-24 DIAGNOSIS — Z23 Encounter for immunization: Secondary | ICD-10-CM

## 2022-01-21 ENCOUNTER — Other Ambulatory Visit (INDEPENDENT_AMBULATORY_CARE_PROVIDER_SITE_OTHER): Payer: Medicare PPO

## 2022-01-21 ENCOUNTER — Other Ambulatory Visit: Payer: Medicare PPO

## 2022-01-21 DIAGNOSIS — E1165 Type 2 diabetes mellitus with hyperglycemia: Secondary | ICD-10-CM

## 2022-01-21 DIAGNOSIS — I10 Essential (primary) hypertension: Secondary | ICD-10-CM | POA: Diagnosis not present

## 2022-01-21 DIAGNOSIS — E7800 Pure hypercholesterolemia, unspecified: Secondary | ICD-10-CM

## 2022-01-21 LAB — HEPATIC FUNCTION PANEL
ALT: 12 U/L (ref 0–35)
AST: 10 U/L (ref 0–37)
Albumin: 4.1 g/dL (ref 3.5–5.2)
Alkaline Phosphatase: 80 U/L (ref 39–117)
Bilirubin, Direct: 0.1 mg/dL (ref 0.0–0.3)
Total Bilirubin: 0.4 mg/dL (ref 0.2–1.2)
Total Protein: 6.7 g/dL (ref 6.0–8.3)

## 2022-01-21 LAB — BASIC METABOLIC PANEL
BUN: 11 mg/dL (ref 6–23)
CO2: 28 mEq/L (ref 19–32)
Calcium: 9.4 mg/dL (ref 8.4–10.5)
Chloride: 103 mEq/L (ref 96–112)
Creatinine, Ser: 0.75 mg/dL (ref 0.40–1.20)
GFR: 75.28 mL/min (ref >60.00)
Glucose, Bld: 115 mg/dL — ABNORMAL HIGH (ref 70–99)
Potassium: 4.3 mEq/L (ref 3.5–5.1)
Sodium: 140 mEq/L (ref 135–145)

## 2022-01-21 LAB — LIPID PANEL
Cholesterol: 197 mg/dL (ref 0–200)
HDL: 54.3 mg/dL (ref >39.00)
LDL Cholesterol: 121 mg/dL — ABNORMAL HIGH (ref 0–99)
NonHDL: 142.23
Total CHOL/HDL Ratio: 4
Triglycerides: 104 mg/dL (ref 0.0–149.0)
VLDL: 20.8 mg/dL (ref 0.0–40.0)

## 2022-01-21 LAB — HEMOGLOBIN A1C: Hgb A1c MFr Bld: 7 % — ABNORMAL HIGH (ref 4.6–6.5)

## 2022-01-21 LAB — TSH: TSH: 2.23 u[IU]/mL (ref 0.35–5.50)

## 2022-01-23 ENCOUNTER — Other Ambulatory Visit (HOSPITAL_COMMUNITY)
Admission: RE | Admit: 2022-01-23 | Discharge: 2022-01-23 | Disposition: A | Discharge status: Home / Self Care | Payer: Medicare PPO | Source: Ambulatory Visit | Attending: Internal Medicine | Admitting: Internal Medicine

## 2022-01-23 ENCOUNTER — Encounter: Payer: Self-pay | Admitting: Internal Medicine

## 2022-01-23 ENCOUNTER — Ambulatory Visit: Payer: Medicare PPO | Admitting: Internal Medicine

## 2022-01-23 VITALS — BP 124/78 | HR 78 | Temp 98.2°F | Resp 17 | Ht 60.0 in | Wt 181.4 lb

## 2022-01-23 DIAGNOSIS — E7800 Pure hypercholesterolemia, unspecified: Secondary | ICD-10-CM | POA: Diagnosis not present

## 2022-01-23 DIAGNOSIS — I700 Atherosclerosis of aorta: Secondary | ICD-10-CM | POA: Diagnosis not present

## 2022-01-23 DIAGNOSIS — M25562 Pain in left knee: Secondary | ICD-10-CM

## 2022-01-23 DIAGNOSIS — I10 Essential (primary) hypertension: Secondary | ICD-10-CM

## 2022-01-23 DIAGNOSIS — N760 Acute vaginitis: Secondary | ICD-10-CM | POA: Insufficient documentation

## 2022-01-23 DIAGNOSIS — E1165 Type 2 diabetes mellitus with hyperglycemia: Secondary | ICD-10-CM

## 2022-01-23 DIAGNOSIS — E039 Hypothyroidism, unspecified: Secondary | ICD-10-CM

## 2022-01-23 DIAGNOSIS — N898 Other specified noninflammatory disorders of vagina: Secondary | ICD-10-CM

## 2022-01-23 MED ORDER — METFORMIN HCL ER 500 MG PO TB24: ORAL_TABLET | ORAL | 1 refills | Status: DC

## 2022-01-23 MED ORDER — LEVOTHYROXINE SODIUM 88 MCG PO TABS
88.0000 ug | ORAL_TABLET | Freq: Every day | ORAL | 1 refills | Status: DC
Start: 2022-01-23 — End: 2022-04-29

## 2022-01-23 MED ORDER — OMEPRAZOLE 20 MG PO CPDR
DELAYED_RELEASE_CAPSULE | ORAL | 1 refills | Status: DC
Start: 2022-01-23 — End: 2022-04-29

## 2022-01-23 NOTE — Progress Notes (Signed)
Subjective:    Patient ID: Veronica Branch, female    DOB: 1940-09-07, 82 y.o.   MRN: [id number]  Patient here for  Chief Complaint  Patient presents with   Medical Management of Chronic Issues    HPI Here to follow up regarding her blood pressure, cholesterol and diabetes.  Reports she is doing relatively well.  No chest pain or sob reported.  No cough or congestion.  No acid reflux reported.  No abdominal pain or bowel change reported.  Using voltaren gel - helping knee.  Started exercising 15-20 minutes per day.  Knee is doing better.  Having some bladder leakage.  Also some vaginal irritation.  Was questioning if irritation being aggravated by urine leakage.     Past Medical History:  Diagnosis Date   Diverticulosis    GERD (gastroesophageal reflux disease)    Hiatal hernia    History of migraine headaches    Hypercholesterolemia    Hyperglycemia    Hypertension    Hypothyroidism    s/p removal of right thyroid lobe and isthmus (1992)   Past Surgical History:  Procedure Laterality Date   BREAST EXCISIONAL BIOPSY Right 2000   benign   THYROID LOBECTOMY  1992   s/p removal of right thyroid lobe and isthmus   Family History  Problem Relation Age of Onset   Coronary artery disease Mother        myocardial infarction and CHF   Breast cancer Mother        43's   Breast cancer Maternal Aunt        x 2   Breast cancer Sister        63's   Breast cancer Cousin        maternal side   Breast cancer Other 50   Colon cancer Neg Hx    Social History   Socioeconomic History   Marital status: Married    Spouse name: Not on file   Number of children: 2   Years of education: Not on file   Highest education level: Not on file  Occupational History   Occupation: retired Pharmacist, hospital  Tobacco Use   Smoking status: Never   Smokeless tobacco: Never  Vaping Use   Vaping Use: Never used  Substance and Sexual Activity   Alcohol use: No    Alcohol/week: 0.0 standard drinks of  alcohol   Drug use: No   Sexual activity: Yes  Other Topics Concern   Not on file  Social History Narrative   Regularly exercises. Retired and married.    Social Determinants of Health   Financial Resource Strain: Not on file  Food Insecurity: Not on file  Transportation Needs: Not on file  Physical Activity: Not on file  Stress: Not on file  Social Connections: Not on file     Review of Systems  Constitutional:  Negative for appetite change and unexpected weight change.  HENT:  Negative for congestion and sinus pressure.   Respiratory:  Negative for cough, chest tightness and shortness of breath.   Cardiovascular:  Negative for chest pain, palpitations and leg swelling.  Gastrointestinal:  Negative for abdominal pain, diarrhea, nausea and vomiting.  Genitourinary:  Negative for difficulty urinating and dysuria.       Vaginal irritation.   Musculoskeletal:  Negative for joint swelling and myalgias.  Skin:  Negative for color change and rash.  Neurological:  Negative for dizziness and headaches.  Psychiatric/Behavioral:  Negative for agitation and dysphoric mood.  Objective:     BP 124/78 (BP Location: Left Arm, Patient Position: Sitting, Cuff Size: Large)   Pulse 78   Temp 98.2 F (36.8 C) (Temporal)   Resp 17   Ht 5' (1.524 m)   Wt 181 lb 6.4 oz (82.3 kg)   SpO2 97%   BMI 35.43 kg/m  Wt Readings from Last 3 Encounters:  01/23/22 181 lb 6.4 oz (82.3 kg)  09/12/21 179 lb 6.4 oz (81.4 kg)  07/19/21 177 lb 9.6 oz (80.6 kg)    Physical Exam Vitals reviewed.  Constitutional:      General: She is not in acute distress.    Appearance: Normal appearance.  HENT:     Head: Normocephalic and atraumatic.     Right Ear: External ear normal.     Left Ear: External ear normal.  Eyes:     General: No scleral icterus.       Right eye: No discharge.        Left eye: No discharge.     Conjunctiva/sclera: Conjunctivae normal.  Neck:     Thyroid: No thyromegaly.   Cardiovascular:     Rate and Rhythm: Normal rate and regular rhythm.  Pulmonary:     Effort: No respiratory distress.     Breath sounds: Normal breath sounds. No wheezing.  Abdominal:     General: Bowel sounds are normal.     Palpations: Abdomen is soft.     Tenderness: There is no abdominal tenderness.  Genitourinary:    Comments: Soft tissue lesion - labia.  Vaginal vault without lesions.  Wet prep obtained. Could not appreciate any adnexal masses or tenderness.   Musculoskeletal:        General: No swelling or tenderness.     Cervical back: Neck supple. No tenderness.  Lymphadenopathy:     Cervical: No cervical adenopathy.  Skin:    Findings: No erythema or rash.  Neurological:     Mental Status: She is alert.  Psychiatric:        Mood and Affect: Mood normal.        Behavior: Behavior normal.      Outpatient Encounter Medications as of 01/23/2022  Medication Sig   ACCU-CHEK GUIDE test strip USE TO TEST BLOOD SUGAR ONCE DAILY   blood glucose meter kit and supplies KIT Dispense based on patient and insurance preference. Use to check sugars once daily. Dx E11.9   famciclovir (FAMVIR) 500 MG tablet Take 1 tablet (500 mg total) by mouth 3 (three) times daily.   Lancets (ONETOUCH ULTRASOFT) lancets Use to test blood sugars 1-2 times daily   E11.9   nystatin cream (MYCOSTATIN) Apply 1 Application topically 2 (two) times daily.   polyethylene glycol (MIRALAX / GLYCOLAX) packet Take 17 g by mouth daily. Mix one tablespoon with 8oz of your favorite juice or water every day until you are having soft formed stools. Then start taking once daily if you didn't have a stool the day before.   propranolol (INDERAL) 40 MG tablet Take 1 tablet (40 mg total) by mouth 2 (two) times daily.   telmisartan (MICARDIS) 20 MG tablet Take 1 tablet (20 mg total) by mouth daily.   levothyroxine (SYNTHROID) 88 MCG tablet Take 1 tablet (88 mcg total) by mouth daily before breakfast.   metFORMIN  (GLUCOPHAGE-XR) 500 MG 24 hr tablet Take one tablet twice a day   omeprazole (PRILOSEC) 20 MG capsule TAKE 1 CAPSULE BY MOUTH EVERY DAY   [DISCONTINUED] Blood Glucose Monitoring  Suppl (ONE TOUCH ULTRA SYSTEM KIT) W/DEVICE KIT 1 kit by Does not apply route once.   [DISCONTINUED] levothyroxine (SYNTHROID) 88 MCG tablet Take 1 tablet (88 mcg total) by mouth daily before breakfast.   [DISCONTINUED] metFORMIN (GLUCOPHAGE-XR) 500 MG 24 hr tablet Take one tablet twice a day   [DISCONTINUED] omeprazole (PRILOSEC) 20 MG capsule TAKE 1 CAPSULE BY MOUTH EVERY DAY   No facility-administered encounter medications on file as of 01/23/2022.     Lab Results  Component Value Date   WBC 7.7 05/02/2021   HGB 13.3 05/02/2021   HCT 40.2 05/02/2021   PLT 249.0 05/02/2021   GLUCOSE 115 (H) 01/21/2022   CHOL 197 01/21/2022   TRIG 104.0 01/21/2022   HDL 54.30 01/21/2022   LDLDIRECT 156.0 12/06/2012   LDLCALC 121 (H) 01/21/2022   ALT 12 01/21/2022   AST 10 01/21/2022   NA 140 01/21/2022   K 4.3 01/21/2022   CL 103 01/21/2022   CREATININE 0.75 01/21/2022   BUN 11 01/21/2022   CO2 28 01/21/2022   TSH 2.23 01/21/2022   HGBA1C 7.0 (H) 01/21/2022   MICROALBUR 1.2 05/02/2021    MM 3D SCREEN BREAST BILATERAL  Result Date: 05/28/2021 CLINICAL DATA:  Screening. EXAM: DIGITAL SCREENING BILATERAL MAMMOGRAM WITH TOMOSYNTHESIS AND CAD TECHNIQUE: Bilateral screening digital craniocaudal and mediolateral oblique mammograms were obtained. Bilateral screening digital breast tomosynthesis was performed. The images were evaluated with computer-aided detection. COMPARISON:  Previous exam(s). ACR Breast Density Category b: There are scattered areas of fibroglandular density. FINDINGS: There are no findings suspicious for malignancy. IMPRESSION: No mammographic evidence of malignancy. A result letter of this screening mammogram will be mailed directly to the patient. RECOMMENDATION: Screening mammogram in one year.  (Code:SM-B-01Y) BI-RADS CATEGORY  1: Negative. Electronically Signed   By: Marin Olp M.D.   On: 05/28/2021 16:05       Assessment & Plan:  Primary hypertension Assessment & Plan: Blood pressure as outlined.  On micardis.  Follow pressures.  Follow metabolic panel.   Orders: -     Basic metabolic panel; Future  Aortic atherosclerosis ([place]) Assessment & Plan: Had been on crestor. Discussed calculated cholesterol risk.  Declines.    Type 2 diabetes mellitus with hyperglycemia, without long-term current use of insulin ([facility]) Assessment & Plan: Low carb diet and exercise.  Discussed diet and exercise.  Discussed treatment.  Declines further treatment. Continue metformin XR.  Follow met b and a1c. Recent a1c Lab Results  Component Value Date   HGBA1C 7.0 (H) 01/21/2022    Orders: -     Hemoglobin A1c; Future  Hypothyroidism, unspecified type Assessment & Plan: On thyroid replacement.  Follow tsh.    Hypercholesterolemia Assessment & Plan: Discussed calculated cholesterol risk.  Discussed recommendation to start cholesterol medication.  Declines at this time.  Low cholesterol diet and exercise.  Follow lipid panel.   Orders: -     Lipid panel; Future -     Hepatic function panel; Future  Acute vaginitis -     Cervicovaginal ancillary only  Left knee pain, unspecified chronicity Assessment & Plan: Knee is doing some better.  Follow.    Vaginal irritation Assessment & Plan: Vaginal irritation and lesion.  KOH/Wet Prep obtained.  Given persistent symptoms and irritation and vaginal lesion, will have gyn evaluate.    Orders: -     Ambulatory referral to Gynecology  Vaginal lesion Assessment & Plan: Vaginal irritation and lesion.  KOH/Wet Prep obtained.  Given persistent symptoms and irritation  and vaginal lesion, will have gyn evaluate.    Orders: -     Ambulatory referral to Gynecology  Other orders -     Levothyroxine Sodium; Take 1 tablet (88 mcg total) by  mouth daily before breakfast.  Dispense: 90 tablet; Refill: 1 -     metFORMIN HCl ER; Take one tablet twice a day  Dispense: 180 tablet; Refill: 1 -     Omeprazole; TAKE 1 CAPSULE BY MOUTH EVERY DAY  Dispense: 90 capsule; Refill: 1     Einar Pheasant, MD

## 2022-01-25 ENCOUNTER — Encounter: Payer: Self-pay | Admitting: Internal Medicine

## 2022-01-25 DIAGNOSIS — N898 Other specified noninflammatory disorders of vagina: Secondary | ICD-10-CM | POA: Insufficient documentation

## 2022-01-25 NOTE — Assessment & Plan Note (Signed)
Low carb diet and exercise.  Discussed diet and exercise.  Discussed treatment.  Declines further treatment. Continue metformin XR.  Follow met b and a1c. Recent a1c Lab Results  Component Value Date   HGBA1C 7.0 (H) 01/21/2022

## 2022-01-25 NOTE — Assessment & Plan Note (Signed)
Had been on crestor. Discussed calculated cholesterol risk.  Declines.

## 2022-01-25 NOTE — Assessment & Plan Note (Signed)
Vaginal irritation and lesion.  KOH/Wet Prep obtained.  Given persistent symptoms and irritation and vaginal lesion, will have gyn evaluate.

## 2022-01-25 NOTE — Assessment & Plan Note (Signed)
Vaginal irritation and lesion.  KOH/Wet Prep obtained.  Given persistent symptoms and irritation and vaginal lesion, will have gyn evaluate.   

## 2022-01-25 NOTE — Assessment & Plan Note (Addendum)
Discussed calculated cholesterol risk.  Discussed recommendation to start cholesterol medication.  Declines at this time.  Low cholesterol diet and exercise.  Follow lipid panel.

## 2022-01-25 NOTE — Assessment & Plan Note (Signed)
On thyroid replacement.  Follow tsh.  

## 2022-01-25 NOTE — Assessment & Plan Note (Signed)
Blood pressure as outlined. On micardis.  Follow pressures.  Follow metabolic panel.  

## 2022-01-25 NOTE — Assessment & Plan Note (Signed)
Knee is doing some better.  Follow.

## 2022-01-27 LAB — CERVICOVAGINAL ANCILLARY ONLY
Bacterial Vaginitis (gardnerella): NEGATIVE
Candida Glabrata: NEGATIVE
Candida Vaginitis: NEGATIVE
Comment: NEGATIVE
Comment: NEGATIVE
Comment: NEGATIVE
Comment: NEGATIVE
Trichomonas: NEGATIVE

## 2022-01-29 ENCOUNTER — Telehealth: Payer: Self-pay | Admitting: Internal Medicine

## 2022-01-29 NOTE — Telephone Encounter (Signed)
Pt called in returning [place] phone call. As per pt its about lab results. She would like a call back.

## 2022-01-30 NOTE — Telephone Encounter (Signed)
See result note.  

## 2022-02-02 ENCOUNTER — Other Ambulatory Visit: Payer: Self-pay | Admitting: Internal Medicine

## 2022-02-05 NOTE — Telephone Encounter (Signed)
Pt called in regarding a GYN appt. As per pt she still have some symptoms and would like to take care this issue asap. I did read the note below to her.

## 2022-02-05 NOTE — Telephone Encounter (Signed)
LM for pt to cb 

## 2022-02-07 ENCOUNTER — Telehealth: Payer: Self-pay

## 2022-02-07 MED ORDER — NYSTATIN 100000 UNIT/GM EX CREA: 1.0000 | TOPICAL_CREAM | Freq: Two times a day (BID) | CUTANEOUS | 0 refills | Status: DC

## 2022-02-07 NOTE — Telephone Encounter (Signed)
Patient states someone called her today from [facility] regarding a referral to a gynecologist and she told them that she does need an appointment with the gynecologist but she is itching and burning really bad and is out of her nystatin cream (MYCOSTATIN).  Patient states she understood that the person she spoke with was going to put a request in for her refill, but her pharmacy has not received the request yet.  Patient states she really needs this as soon as possible.

## 2022-02-07 NOTE — Telephone Encounter (Signed)
See result note message. Nystatin has been refilled.

## 2022-02-07 NOTE — Telephone Encounter (Signed)
Patient states her preferred pharmacy is [facility] on [address].  Patient states she would like to see if she can get a bigger tube of the nystatin cream (MYCOSTATIN).

## 2022-02-11 DIAGNOSIS — N9089 Other specified noninflammatory disorders of vulva and perineum: Secondary | ICD-10-CM | POA: Diagnosis not present

## 2022-02-11 DIAGNOSIS — L449 Papulosquamous disorder, unspecified: Secondary | ICD-10-CM | POA: Diagnosis not present

## 2022-02-19 ENCOUNTER — Telehealth: Payer: Self-pay | Admitting: Internal Medicine

## 2022-02-19 NOTE — Telephone Encounter (Signed)
Called patient to let her know that I do not see any notes in the chart stating that anyone called from our office but Dr Eduard Roux office was trying to get in touch with her. She is going to give them a call.

## 2022-02-19 NOTE — Telephone Encounter (Signed)
Pt returning call

## 2022-04-24 ENCOUNTER — Other Ambulatory Visit (INDEPENDENT_AMBULATORY_CARE_PROVIDER_SITE_OTHER): Payer: Medicare PPO

## 2022-04-24 DIAGNOSIS — E1165 Type 2 diabetes mellitus with hyperglycemia: Secondary | ICD-10-CM

## 2022-04-24 DIAGNOSIS — I10 Essential (primary) hypertension: Secondary | ICD-10-CM

## 2022-04-24 DIAGNOSIS — E7800 Pure hypercholesterolemia, unspecified: Secondary | ICD-10-CM

## 2022-04-24 LAB — LIPID PANEL
Cholesterol: 187 mg/dL (ref 0–200)
HDL: 49.7 mg/dL (ref >39.00)
LDL Cholesterol: 114 mg/dL — ABNORMAL HIGH (ref 0–99)
NonHDL: 137.59
Total CHOL/HDL Ratio: 4
Triglycerides: 118 mg/dL (ref 0.0–149.0)
VLDL: 23.6 mg/dL (ref 0.0–40.0)

## 2022-04-24 LAB — BASIC METABOLIC PANEL
BUN: 10 mg/dL (ref 6–23)
CO2: 30 mEq/L (ref 19–32)
Calcium: 9.2 mg/dL (ref 8.4–10.5)
Chloride: 105 mEq/L (ref 96–112)
Creatinine, Ser: 0.71 mg/dL (ref 0.40–1.20)
GFR: 80.26 mL/min (ref >60.00)
Glucose, Bld: 103 mg/dL — ABNORMAL HIGH (ref 70–99)
Potassium: 4 mEq/L (ref 3.5–5.1)
Sodium: 142 mEq/L (ref 135–145)

## 2022-04-24 LAB — HEPATIC FUNCTION PANEL
ALT: 12 U/L (ref 0–35)
AST: 10 U/L (ref 0–37)
Albumin: 4.2 g/dL (ref 3.5–5.2)
Alkaline Phosphatase: 80 U/L (ref 39–117)
Bilirubin, Direct: 0.1 mg/dL (ref 0.0–0.3)
Total Bilirubin: 0.6 mg/dL (ref 0.2–1.2)
Total Protein: 6.5 g/dL (ref 6.0–8.3)

## 2022-04-24 LAB — HEMOGLOBIN A1C: Hgb A1c MFr Bld: 6.9 % — ABNORMAL HIGH (ref 4.6–6.5)

## 2022-04-29 ENCOUNTER — Encounter: Payer: Self-pay | Admitting: Internal Medicine

## 2022-04-29 ENCOUNTER — Ambulatory Visit (INDEPENDENT_AMBULATORY_CARE_PROVIDER_SITE_OTHER): Payer: Medicare PPO | Admitting: Internal Medicine

## 2022-04-29 VITALS — BP 112/70 | HR 63 | Temp 98.0°F | Resp 16 | Ht 60.0 in | Wt 182.6 lb

## 2022-04-29 DIAGNOSIS — E7800 Pure hypercholesterolemia, unspecified: Secondary | ICD-10-CM | POA: Diagnosis not present

## 2022-04-29 DIAGNOSIS — N898 Other specified noninflammatory disorders of vagina: Secondary | ICD-10-CM | POA: Diagnosis not present

## 2022-04-29 DIAGNOSIS — E039 Hypothyroidism, unspecified: Secondary | ICD-10-CM | POA: Diagnosis not present

## 2022-04-29 DIAGNOSIS — E1165 Type 2 diabetes mellitus with hyperglycemia: Secondary | ICD-10-CM

## 2022-04-29 DIAGNOSIS — I10 Essential (primary) hypertension: Secondary | ICD-10-CM | POA: Diagnosis not present

## 2022-04-29 DIAGNOSIS — I700 Atherosclerosis of aorta: Secondary | ICD-10-CM

## 2022-04-29 DIAGNOSIS — R32 Unspecified urinary incontinence: Secondary | ICD-10-CM | POA: Diagnosis not present

## 2022-04-29 DIAGNOSIS — Z1231 Encounter for screening mammogram for malignant neoplasm of breast: Secondary | ICD-10-CM

## 2022-04-29 DIAGNOSIS — Z0000 Encounter for general adult medical examination without abnormal findings: Secondary | ICD-10-CM

## 2022-04-29 DIAGNOSIS — G720 Drug-induced myopathy: Secondary | ICD-10-CM

## 2022-04-29 DIAGNOSIS — T466X5A Adverse effect of antihyperlipidemic and antiarteriosclerotic drugs, initial encounter: Secondary | ICD-10-CM

## 2022-04-29 LAB — HM DIABETES FOOT EXAM

## 2022-04-29 MED ORDER — LEVOTHYROXINE SODIUM 88 MCG PO TABS: 88.0000 ug | ORAL_TABLET | Freq: Every day | ORAL | 1 refills | Status: DC

## 2022-04-29 MED ORDER — OMEPRAZOLE 20 MG PO CPDR: DELAYED_RELEASE_CAPSULE | ORAL | 1 refills | Status: DC

## 2022-04-29 MED ORDER — METFORMIN HCL ER 500 MG PO TB24: ORAL_TABLET | ORAL | 1 refills | Status: DC

## 2022-04-29 MED ORDER — TELMISARTAN 20 MG PO TABS: 20.0000 mg | ORAL_TABLET | Freq: Every day | ORAL | 3 refills | Status: DC

## 2022-04-29 MED ORDER — EZETIMIBE 10 MG PO TABS
10.0000 mg | ORAL_TABLET | Freq: Every day | ORAL | 1 refills | Status: DC
Start: 2022-04-29 — End: 2023-05-26

## 2022-04-29 NOTE — Assessment & Plan Note (Signed)
Physical today 04/29/22.  Mammogram 05/28/21- Birads I.  Has declined colonoscopy.  Discussed f/u colonoscopy.  Agreeable for f/u with GI to discuss further w/up and evaluation.

## 2022-04-29 NOTE — Progress Notes (Signed)
Subjective:    Patient ID: Veronica Branch, female    DOB: 1940/08/22, 82 y.o.   MRN: [id number]  Patient here for  Chief Complaint  Patient presents with   Annual Exam    HPI Here for physical exam. Saw gyn recently - evaluation - vulvar lesion.  S/p biopsy.  Has noticed bladder leakage.  Persistent. Using a pad.  Discussed further evaluation.  Agreeable to urology referral.  Knee is better.  No chest pain or sob reported.  No increased cough or congestion.  No abdominal pain.  Discussed labs.  Discussed cholesterol.  Intolerant to statin medication.  Discussed zetia and repatha.     Past Medical History:  Diagnosis Date   Diverticulosis    GERD (gastroesophageal reflux disease)    Hiatal hernia    History of migraine headaches    Hypercholesterolemia    Hyperglycemia    Hypertension    Hypothyroidism    s/p removal of right thyroid lobe and isthmus (1992)   Past Surgical History:  Procedure Laterality Date   BREAST EXCISIONAL BIOPSY Right 2000   benign   THYROID LOBECTOMY  1992   s/p removal of right thyroid lobe and isthmus   Family History  Problem Relation Age of Onset   Coronary artery disease Mother        myocardial infarction and CHF   Breast cancer Mother        63's   Breast cancer Maternal Aunt        x 2   Breast cancer Sister        70's   Breast cancer Cousin        maternal side   Breast cancer Other 64   Colon cancer Neg Hx    Social History   Socioeconomic History   Marital status: Married    Spouse name: Not on file   Number of children: 2   Years of education: Not on file   Highest education level: Not on file  Occupational History   Occupation: retired Runner, broadcasting/film/video  Tobacco Use   Smoking status: Never   Smokeless tobacco: Never  Vaping Use   Vaping Use: Never used  Substance and Sexual Activity   Alcohol use: No    Alcohol/week: 0.0 standard drinks of alcohol   Drug use: No   Sexual activity: Yes  Other Topics Concern   Not on  file  Social History Narrative   Regularly exercises. Retired and married.    Social Determinants of Health   Financial Resource Strain: Not on file  Food Insecurity: Not on file  Transportation Needs: Not on file  Physical Activity: Not on file  Stress: Not on file  Social Connections: Not on file     Review of Systems  Constitutional:  Negative for appetite change and unexpected weight change.  HENT:  Negative for congestion, sinus pressure and sore throat.   Eyes:  Negative for pain and visual disturbance.  Respiratory:  Negative for cough, chest tightness and shortness of breath.   Cardiovascular:  Negative for chest pain and palpitations.  Gastrointestinal:  Negative for abdominal pain, diarrhea, nausea and vomiting.  Genitourinary:  Negative for difficulty urinating and dysuria.  Musculoskeletal:  Negative for joint swelling and myalgias.  Skin:  Negative for color change and rash.  Neurological:  Negative for dizziness and headaches.  Hematological:  Negative for adenopathy. Does not bruise/bleed easily.  Psychiatric/Behavioral:  Negative for agitation and dysphoric mood.  Objective:     BP 112/70   Pulse 63   Temp 98 F (36.7 C)   Resp 16   Ht 5' (1.524 m)   Wt 182 lb 9.6 oz (82.8 kg)   SpO2 98%   BMI 35.66 kg/m  Wt Readings from Last 3 Encounters:  04/29/22 182 lb 9.6 oz (82.8 kg)  01/23/22 181 lb 6.4 oz (82.3 kg)  09/12/21 179 lb 6.4 oz (81.4 kg)    Physical Exam Vitals reviewed.  Constitutional:      General: She is not in acute distress.    Appearance: Normal appearance. She is well-developed.  HENT:     Head: Normocephalic and atraumatic.     Right Ear: External ear normal.     Left Ear: External ear normal.  Eyes:     General: No scleral icterus.       Right eye: No discharge.        Left eye: No discharge.     Conjunctiva/sclera: Conjunctivae normal.  Neck:     Thyroid: No thyromegaly.  Cardiovascular:     Rate and Rhythm: Normal  rate and regular rhythm.  Pulmonary:     Effort: No tachypnea, accessory muscle usage or respiratory distress.     Breath sounds: Normal breath sounds. No decreased breath sounds or wheezing.  Chest:  Breasts:    Right: No inverted nipple, mass, nipple discharge or tenderness (no axillary adenopathy).     Left: No inverted nipple, mass, nipple discharge or tenderness (no axilarry adenopathy).  Abdominal:     General: Bowel sounds are normal.     Palpations: Abdomen is soft.     Tenderness: There is no abdominal tenderness.  Musculoskeletal:        General: No swelling or tenderness.     Cervical back: Neck supple.  Lymphadenopathy:     Cervical: No cervical adenopathy.  Skin:    Findings: No erythema or rash.  Neurological:     Mental Status: She is alert and oriented to person, place, and time.  Psychiatric:        Mood and Affect: Mood normal.        Behavior: Behavior normal.      Outpatient Encounter Medications as of 04/29/2022  Medication Sig   ezetimibe (ZETIA) 10 MG tablet Take 1 tablet (10 mg total) by mouth daily.   triamcinolone ointment (KENALOG) 0.1 % Apply 1 Application topically as needed.   ACCU-CHEK GUIDE test strip USE TO TEST BLOOD SUGAR ONCE DAILY   blood glucose meter kit and supplies KIT Dispense based on patient and insurance preference. Use to check sugars once daily. Dx E11.9   famciclovir (FAMVIR) 500 MG tablet Take 1 tablet (500 mg total) by mouth 3 (three) times daily.   Lancets (ONETOUCH ULTRASOFT) lancets Use to test blood sugars 1-2 times daily   E11.9   levothyroxine (SYNTHROID) 88 MCG tablet Take 1 tablet (88 mcg total) by mouth daily before breakfast.   metFORMIN (GLUCOPHAGE-XR) 500 MG 24 hr tablet Take one tablet twice a day   nystatin cream (MYCOSTATIN) Apply 1 Application topically 2 (two) times daily.   omeprazole (PRILOSEC) 20 MG capsule TAKE 1 CAPSULE BY MOUTH EVERY DAY   polyethylene glycol (MIRALAX / GLYCOLAX) packet Take 17 g by mouth  daily. Mix one tablespoon with 8oz of your favorite juice or water every day until you are having soft formed stools. Then start taking once daily if you didn't have a stool the day before.  propranolol (INDERAL) 40 MG tablet TAKE 1 TABLET BY MOUTH TWICE A DAY   telmisartan (MICARDIS) 20 MG tablet Take 1 tablet (20 mg total) by mouth daily.   [DISCONTINUED] Blood Glucose Monitoring Suppl (ONE TOUCH ULTRA SYSTEM KIT) W/DEVICE KIT 1 kit by Does not apply route once.   [DISCONTINUED] levothyroxine (SYNTHROID) 88 MCG tablet Take 1 tablet (88 mcg total) by mouth daily before breakfast.   [DISCONTINUED] metFORMIN (GLUCOPHAGE-XR) 500 MG 24 hr tablet Take one tablet twice a day   [DISCONTINUED] omeprazole (PRILOSEC) 20 MG capsule TAKE 1 CAPSULE BY MOUTH EVERY DAY   [DISCONTINUED] telmisartan (MICARDIS) 20 MG tablet Take 1 tablet (20 mg total) by mouth daily.   No facility-administered encounter medications on file as of 04/29/2022.     Lab Results  Component Value Date   WBC 7.7 05/02/2021   HGB 13.3 05/02/2021   HCT 40.2 05/02/2021   PLT 249.0 05/02/2021   GLUCOSE 103 (H) 04/24/2022   CHOL 187 04/24/2022   TRIG 118.0 04/24/2022   HDL 49.70 04/24/2022   LDLDIRECT 156.0 12/06/2012   LDLCALC 114 (H) 04/24/2022   ALT 12 04/24/2022   AST 10 04/24/2022   NA 142 04/24/2022   K 4.0 04/24/2022   CL 105 04/24/2022   CREATININE 0.71 04/24/2022   BUN 10 04/24/2022   CO2 30 04/24/2022   TSH 2.23 01/21/2022   HGBA1C 6.9 (H) 04/24/2022   MICROALBUR 1.2 05/02/2021    No results found.     Assessment & Plan:  Routine general medical examination at a health care facility  Healthcare maintenance Assessment & Plan: Physical today 04/29/22.  Mammogram 05/28/21- Birads I.  Has declined colonoscopy.  Discussed f/u colonoscopy.  Agreeable for f/u with GI to discuss further w/up and evaluation.    Type 2 diabetes mellitus with hyperglycemia, without long-term current use of insulin Assessment &  Plan: Low carb diet and exercise.  Discussed diet and exercise.  Discussed treatment.  Declines further treatment. Continue metformin XR.  Follow met b and a1c. Recent A1c improved.  Lab Results  Component Value Date   HGBA1C 6.9 (H) 04/24/2022    Orders: -     Hemoglobin A1c; Future -     Microalbumin / creatinine urine ratio; Future  Hypercholesterolemia Assessment & Plan: Discussed calculated cholesterol risk.  Discussed recommendation to start cholesterol medication. Intolerant to statin medication.  Agreeable to start zetia.  Low cholesterol diet and exercise.  Follow lipid panel.   Orders: -     Lipid panel; Future -     Hepatic function panel; Future  Primary hypertension Assessment & Plan: Blood pressure as outlined.  On micardis.  Follow pressures.  Follow metabolic panel.   Orders: -     CBC with Differential/Platelet; Future -     Basic metabolic panel; Future  Visit for screening mammogram -     3D Screening Mammogram, Left and Right; Future  Bladder leak -     Ambulatory referral to Urology  Aortic atherosclerosis Assessment & Plan: Had been on crestor. Discussed calculated cholesterol risk.  Start zetia due to intolerance to statin medication.    Hypothyroidism, unspecified type Assessment & Plan: On thyroid replacement.  Follow tsh.    Vaginal lesion Assessment & Plan: Vulvar lesion - Dr Thomasene Mohair - 02/12/22 - biopsy.    Statin myopathy Assessment & Plan: Intolerant to statin medication.  Start zetia as outlined.    Other orders -     Levothyroxine Sodium; Take 1  tablet (88 mcg total) by mouth daily before breakfast.  Dispense: 90 tablet; Refill: 1 -     metFORMIN HCl ER; Take one tablet twice a day  Dispense: 180 tablet; Refill: 1 -     Omeprazole; TAKE 1 CAPSULE BY MOUTH EVERY DAY  Dispense: 90 capsule; Refill: 1 -     Telmisartan; Take 1 tablet (20 mg total) by mouth daily.  Dispense: 90 tablet; Refill: 3 -     Ezetimibe; Take 1 tablet  (10 mg total) by mouth daily.  Dispense: 90 tablet; Refill: 1     Dale Durhamharlene Onis Markoff, MD

## 2022-05-04 ENCOUNTER — Encounter: Payer: Self-pay | Admitting: Internal Medicine

## 2022-05-04 DIAGNOSIS — G720 Drug-induced myopathy: Secondary | ICD-10-CM | POA: Insufficient documentation

## 2022-05-04 NOTE — Assessment & Plan Note (Signed)
Blood pressure as outlined. On micardis.  Follow pressures.  Follow metabolic panel.  

## 2022-05-04 NOTE — Assessment & Plan Note (Signed)
On thyroid replacement.  Follow tsh.  

## 2022-05-04 NOTE — Assessment & Plan Note (Signed)
Had been on crestor. Discussed calculated cholesterol risk.  Start zetia due to intolerance to statin medication.

## 2022-05-04 NOTE — Assessment & Plan Note (Signed)
Vulvar lesion - Dr Thomasene Mohair - 02/12/22 - biopsy.

## 2022-05-04 NOTE — Assessment & Plan Note (Signed)
Low carb diet and exercise.  Discussed diet and exercise.  Discussed treatment.  Declines further treatment. Continue metformin XR.  Follow met b and a1c. Recent A1c improved.  Lab Results  Component Value Date   HGBA1C 6.9 (H) 04/24/2022

## 2022-05-04 NOTE — Assessment & Plan Note (Signed)
Discussed calculated cholesterol risk.  Discussed recommendation to start cholesterol medication. Intolerant to statin medication.  Agreeable to start zetia.  Low cholesterol diet and exercise.  Follow lipid panel.

## 2022-05-04 NOTE — Assessment & Plan Note (Signed)
Intolerant to statin medication.  Start zetia as outlined.

## 2022-05-07 ENCOUNTER — Ambulatory Visit: Payer: Medicare PPO | Admitting: Urology

## 2022-05-07 ENCOUNTER — Encounter: Payer: Self-pay | Admitting: Internal Medicine

## 2022-05-07 VITALS — BP 123/76 | HR 60 | Ht 60.0 in | Wt 180.0 lb

## 2022-05-07 DIAGNOSIS — N3946 Mixed incontinence: Secondary | ICD-10-CM | POA: Diagnosis not present

## 2022-05-07 DIAGNOSIS — N393 Stress incontinence (female) (male): Secondary | ICD-10-CM | POA: Diagnosis not present

## 2022-05-07 DIAGNOSIS — R8290 Unspecified abnormal findings in urine: Secondary | ICD-10-CM

## 2022-05-07 LAB — MICROSCOPIC EXAMINATION: Epithelial Cells (non renal): >10 /hpf — AB (ref 0–10)

## 2022-05-07 LAB — URINALYSIS, COMPLETE
Bilirubin, UA: NEGATIVE
Glucose, UA: NEGATIVE
Ketones, UA: NEGATIVE
Nitrite, UA: NEGATIVE
Protein,UA: NEGATIVE
Specific Gravity, UA: 1.025 (ref 1.005–1.030)
Urobilinogen, Ur: 0.2 mg/dL (ref 0.2–1.0)
pH, UA: 5 (ref 5.0–7.5)

## 2022-05-07 LAB — BLADDER SCAN AMB NON-IMAGING: Scan Result: 20

## 2022-05-07 MED ORDER — GEMTESA 75 MG PO TABS
1.0000 | ORAL_TABLET | Freq: Every day | ORAL | 0 refills | Status: DC
Start: 2022-05-07 — End: 2022-06-05

## 2022-05-07 NOTE — Progress Notes (Signed)
I, Amy L Pierron,acting as a scribe for Vanna Scotland, MD.,have documented all relevant documentation on the behalf of Vanna Scotland, MD,as directed by  Vanna Scotland, MD while in the presence of Vanna Scotland, MD.  05/07/2022 4:38 PM   [name] 1940-06-25 [id number]  Referring provider: Dale Pocahontas, MD [address] [place],  [place] [address]  Chief Complaint  Patient presents with   Establish Care   Urinary Incontinence    HPI: 82 year-old female presents today complaining of urinary incontinence.   She was recently seen by Dr. Jean Rosenthal, an OBGYN, with a vulvar lesion consistent with lichen planus. She was prescribed Triamcinolone. Looks like she's got all the things. She's not had a UA since 2022. She's not on any medications at this time.   Her urinalysis looks contaminated today. 11-30 white blood cells, 3-10 red blood cells, and greater than 10 epithelial cells.  She is not pleased with her bladder symptoms. In the last year she has felt the need to wear pads all the time. Even though she uses the bathroom they still get wet. This happens at night too. She has nocturia 2-4 times but the pad is wet in the morning. She feels like she leaks all the time.   She doesn't have constipation but has diarrhea which she attributes to taking Metformin. She has external stinging and burning which can be chronic. It presents differently than and UTI which she has had in the past. She struggled giving her urine sample today. She denies bulging in her vagina.  Results for orders placed or performed in visit on 05/07/22  Microscopic Examination   Urine  Result Value Ref Range   WBC, UA 11-30 (A) 0 - 5 /hpf   RBC, Urine 3-10 (A) 0 - 2 /hpf   Epithelial Cells (non renal) >10 (A) 0 - 10 /hpf   Bacteria, UA Moderate (A) None seen/Few  Urinalysis, Complete  Result Value Ref Range   Specific Gravity, UA 1.025 1.005 - 1.030   pH, UA 5.0 5.0 - 7.5   Color,  UA Yellow Yellow   Appearance Ur Clear Clear   Leukocytes,UA Trace (A) Negative   Protein,UA Negative Negative/Trace   Glucose, UA Negative Negative   Ketones, UA Negative Negative   RBC, UA 2+ (A) Negative   Bilirubin, UA Negative Negative   Urobilinogen, Ur 0.2 0.2 - 1.0 mg/dL   Nitrite, UA Negative Negative   Microscopic Examination See below:   Bladder Scan (Post Void Residual) in office  Result Value Ref Range   Scan Result 20 ml      PMH: Past Medical History:  Diagnosis Date   Diverticulosis    GERD (gastroesophageal reflux disease)    Hiatal hernia    History of migraine headaches    Hypercholesterolemia    Hyperglycemia    Hypertension    Hypothyroidism    s/p removal of right thyroid lobe and isthmus (1992)    Surgical History: Past Surgical History:  Procedure Laterality Date   BREAST EXCISIONAL BIOPSY Right 2000   benign   THYROID LOBECTOMY  1992   s/p removal of right thyroid lobe and isthmus    Home Medications:  Allergies as of 05/07/2022       Reactions   Ciprofloxacin Hcl Other (See Comments)   tendonitis   Penicillins Hives   Urticaria   Sulfa Antibiotics Rash        Medication List  Accurate as of May 07, 2022  4:38 PM. If you have any questions, ask your nurse or doctor.          STOP taking these medications    omeprazole 20 MG capsule Commonly known as: PRILOSEC       TAKE these medications    Accu-Chek Guide test strip Generic drug: glucose blood USE TO TEST BLOOD SUGAR ONCE DAILY   blood glucose meter kit and supplies Kit Dispense based on patient and insurance preference. Use to check sugars once daily. Dx E11.9   ezetimibe 10 MG tablet Commonly known as: Zetia Take 1 tablet (10 mg total) by mouth daily.   famciclovir 500 MG tablet Commonly known as: FAMVIR Take 1 tablet (500 mg total) by mouth 3 (three) times daily.   Gemtesa 75 MG Tabs Generic drug: Vibegron Take 1 tablet (75 mg total) by mouth  daily.   levothyroxine 88 MCG tablet Commonly known as: SYNTHROID Take 1 tablet (88 mcg total) by mouth daily before breakfast.   metFORMIN 500 MG 24 hr tablet Commonly known as: GLUCOPHAGE-XR Take one tablet twice a day   nystatin cream Commonly known as: MYCOSTATIN Apply 1 Application topically 2 (two) times daily.   onetouch ultrasoft lancets Use to test blood sugars 1-2 times daily   E11.9   polyethylene glycol 17 g packet Commonly known as: MIRALAX / GLYCOLAX Take 17 g by mouth daily. Mix one tablespoon with 8oz of your favorite juice or water every day until you are having soft formed stools. Then start taking once daily if you didn't have a stool the day before.   propranolol 40 MG tablet Commonly known as: INDERAL TAKE 1 TABLET BY MOUTH TWICE A DAY   telmisartan 20 MG tablet Commonly known as: MICARDIS Take 1 tablet (20 mg total) by mouth daily.   triamcinolone ointment 0.1 % Commonly known as: KENALOG Apply 1 Application topically as needed.        Allergies:  Allergies  Allergen Reactions   Ciprofloxacin Hcl Other (See Comments)    tendonitis   Penicillins Hives    Urticaria    Sulfa Antibiotics Rash    Family History: Family History  Problem Relation Age of Onset   Coronary artery disease Mother        myocardial infarction and CHF   Breast cancer Mother        56's   Breast cancer Maternal Aunt        x 2   Breast cancer Sister        58's   Breast cancer Cousin        maternal side   Breast cancer Other 90   Colon cancer Neg Hx     Social History:  reports that she has never smoked. She has never used smokeless tobacco. She reports that she does not drink alcohol and does not use drugs.   Physical Exam: BP 123/76   Pulse 60   Ht 5' (1.524 m)   Wt 180 lb (81.6 kg)   BMI 35.15 kg/m   Constitutional:  Alert and oriented, No acute distress. HEENT:  AT, moist mucus membranes.  Trachea midline, no masses. Neurologic: Grossly  intact, no focal deficits, moving all 4 extremities. Psychiatric: Normal mood and affect.   Urinalysis    Component Value Date/Time   COLORURINE YELLOW 12/18/2020 1243   APPEARANCEUR Clear 05/07/2022 1350   LABSPEC 1.025 12/18/2020 1243   PHURINE 5.5 12/18/2020 1243   GLUCOSEU Negative  05/07/2022 1350   GLUCOSEU NEGATIVE 12/18/2020 1243   HGBUR MODERATE (A) 12/18/2020 1243   BILIRUBINUR Negative 05/07/2022 1350   KETONESUR NEGATIVE 12/18/2020 1243   PROTEINUR Negative 05/07/2022 1350   PROTEINUR NEGATIVE 06/15/2020 2043   UROBILINOGEN 0.2 12/18/2020 1243   NITRITE Negative 05/07/2022 1350   NITRITE NEGATIVE 12/18/2020 1243   LEUKOCYTESUR Trace (A) 05/07/2022 1350   LEUKOCYTESUR NEGATIVE 12/18/2020 1243    Lab Results  Component Value Date   LABMICR See below: 05/07/2022   WBCUA 11-30 (A) 05/07/2022   RBCUA 0-2 11/24/2016   LABEPIT >10 (A) 05/07/2022   BACTERIA Moderate (A) 05/07/2022    Assessment & Plan:    Mixed urinary incontinence  - Chronic but worsening.  -Emptying her bladder well, no concern for overflow incontinence  -  She had difficulty giving a sample today due to her incontinence and low bladder volume.   - Do not suspect underlying infection; rather probable contaminated sample.   - Will try Gemtasa 75 milligrams. Samples given for one month.   - She has chronic dry eyes so she prefers to avoid anticholinergics. She plans to investigate her insurance formulary to see which medication is covered so we can make more informed decisions about pharmacotherapy moving forward.  2. Abnormal urinalysis  -  Her urinalysis has many epithelial cells consistent with contaminated specimen.  - Told her to return next time with a full bladder. Will reevaluate if it's still contaminated. Explained we may need to catheterize her for a specimen.  - If there's still microscopic blood, consider a cystoscopy for further evaluation. She's agreeable to this plan.  Return in  about 1 month (around 06/06/2022) for UA.  I have reviewed the above documentation for accuracy and completeness, and I agree with the above.   Vanna Scotland, MD   [facility] [address], [address] [place], [place] [address] [phone]

## 2022-05-11 ENCOUNTER — Other Ambulatory Visit: Payer: Self-pay | Admitting: Internal Medicine

## 2022-05-13 ENCOUNTER — Telehealth: Payer: Self-pay | Admitting: Internal Medicine

## 2022-05-13 NOTE — Telephone Encounter (Signed)
Copied from CRM [id number]. Topic: Medicare AWV >> May 13, 2022 10:43 AM Rushie Goltz wrote: Reason for CRM: Called patient to schedule Medicare Annual Wellness Visit (AWV). Left message for patient to call back and schedule Medicare Annual Wellness Visit (AWV).  Last date of AWV: 02/11/2016  Please schedule an AWVS appointment at any time with [facility] VISIT.  If any questions, please contact me at [phone].    Thank you,  [facility] Support [facility] Direct dial  [phone]

## 2022-06-04 ENCOUNTER — Encounter: Payer: Self-pay | Admitting: Physician Assistant

## 2022-06-04 ENCOUNTER — Ambulatory Visit: Payer: Medicare PPO | Admitting: Physician Assistant

## 2022-06-04 VITALS — BP 143/74 | HR 57 | Ht 60.0 in | Wt 179.0 lb

## 2022-06-04 DIAGNOSIS — N3946 Mixed incontinence: Secondary | ICD-10-CM

## 2022-06-04 DIAGNOSIS — R3129 Other microscopic hematuria: Secondary | ICD-10-CM | POA: Diagnosis not present

## 2022-06-04 DIAGNOSIS — R339 Retention of urine, unspecified: Secondary | ICD-10-CM | POA: Diagnosis not present

## 2022-06-04 DIAGNOSIS — N393 Stress incontinence (female) (male): Secondary | ICD-10-CM | POA: Diagnosis not present

## 2022-06-04 LAB — URINALYSIS, COMPLETE
Bilirubin, UA: NEGATIVE
Glucose, UA: NEGATIVE
Ketones, UA: NEGATIVE
Leukocytes,UA: NEGATIVE
Nitrite, UA: NEGATIVE
Protein,UA: NEGATIVE
Specific Gravity, UA: 1.01 (ref 1.005–1.030)
Urobilinogen, Ur: 0.2 mg/dL (ref 0.2–1.0)
pH, UA: 6 (ref 5.0–7.5)

## 2022-06-04 LAB — MICROSCOPIC EXAMINATION

## 2022-06-04 LAB — BLADDER SCAN AMB NON-IMAGING: Scan Result: 163

## 2022-06-04 NOTE — Progress Notes (Signed)
06/04/2022 3:34 PM   Veronica Branch [birth year] [id number]  CC: Chief Complaint  Patient presents with   Follow-up   Over Active Bladder   HPI: Veronica Branch is a 82 y.o. female with PMH lichen planus with chronic vulvar burning and OAB wet with mixed incontinence who presents today for recheck on Gemtesa and repeat UA after being found to have microscopic hematuria on a contaminated sample at her last clinic visit.   Today she reports she took [place] twice and had diarrhea with both doses, so she stopped it.  She also gets diarrhea from metformin, so she is not certain that this was due to [place].  She also has chronic dry eye.  She wonders why nobody has mentioned the option of "tacking her bladder up" to help her urinary symptoms.  When she saw Dr. Jean Rosenthal in clinic in January, he did not note any vaginal prolapse.  She prefers to defer pelvic exam today.  In-office UA today positive for 1+ blood; urine microscopy with 3-10 RBCs/HPF. PVR .  PMH: Past Medical History:  Diagnosis Date   Diverticulosis    GERD (gastroesophageal reflux disease)    Hiatal hernia    History of migraine headaches    Hypercholesterolemia    Hyperglycemia    Hypertension    Hypothyroidism    s/p removal of right thyroid lobe and isthmus (1992)    Surgical History: Past Surgical History:  Procedure Laterality Date   BREAST EXCISIONAL BIOPSY Right 2000   benign   THYROID LOBECTOMY  1992   s/p removal of right thyroid lobe and isthmus    Home Medications:  Allergies as of 06/04/2022       Reactions   Ciprofloxacin Hcl Other (See Comments)   tendonitis   Penicillins Hives   Urticaria   Sulfa Antibiotics Rash        Medication List        Accurate as of Jun 04, 2022  3:34 PM. If you have any questions, ask your nurse or doctor.          Accu-Chek Guide test strip Generic drug: glucose blood USE TO TEST BLOOD SUGAR ONCE DAILY   blood glucose meter kit and  supplies Kit Dispense based on patient and insurance preference. Use to check sugars once daily. Dx E11.9   ezetimibe 10 MG tablet Commonly known as: Zetia Take 1 tablet (10 mg total) by mouth daily.   famciclovir 500 MG tablet Commonly known as: FAMVIR Take 1 tablet (500 mg total) by mouth 3 (three) times daily.   Gemtesa 75 MG Tabs Generic drug: Vibegron Take 1 tablet (75 mg total) by mouth daily.   levothyroxine 88 MCG tablet Commonly known as: SYNTHROID Take 1 tablet (88 mcg total) by mouth daily before breakfast.   metFORMIN 500 MG 24 hr tablet Commonly known as: GLUCOPHAGE-XR Take one tablet twice a day   nystatin cream Commonly known as: MYCOSTATIN Apply 1 Application topically 2 (two) times daily.   onetouch ultrasoft lancets Use to test blood sugars 1-2 times daily   E11.9   polyethylene glycol 17 g packet Commonly known as: MIRALAX / GLYCOLAX Take 17 g by mouth daily. Mix one tablespoon with 8oz of your favorite juice or water every day until you are having soft formed stools. Then start taking once daily if you didn't have a stool the day before.   propranolol 40 MG tablet Commonly known as: INDERAL TAKE 1 TABLET BY MOUTH TWICE A  DAY   telmisartan 20 MG tablet Commonly known as: MICARDIS Take 1 tablet (20 mg total) by mouth daily.   triamcinolone ointment 0.1 % Commonly known as: KENALOG Apply 1 Application topically as needed.        Allergies:  Allergies  Allergen Reactions   Ciprofloxacin Hcl Other (See Comments)    tendonitis   Penicillins Hives    Urticaria    Sulfa Antibiotics Rash    Family History: Family History  Problem Relation Age of Onset   Coronary artery disease Mother        myocardial infarction and CHF   Breast cancer Mother        9's   Breast cancer Maternal Aunt        x 2   Breast cancer Sister        2's   Breast cancer Cousin        maternal side   Breast cancer Other 47   Colon cancer Neg Hx     Social  History:   reports that she has never smoked. She has never used smokeless tobacco. She reports that she does not drink alcohol and does not use drugs.  Physical Exam: BP (!) 143/74   Pulse (!) 57   Ht 5' (1.524 m)   Wt 179 lb (81.2 kg)   BMI 34.96 kg/m   Constitutional:  Alert and oriented, no acute distress, nontoxic appearing HEENT: [place], AT Cardiovascular: No clubbing, cyanosis, or edema Respiratory: Normal respiratory effort, no increased work of breathing Skin: No rashes, bruises or suspicious lesions Neurologic: Grossly intact, no focal deficits, moving all 4 extremities Psychiatric: Normal mood and affect  Laboratory Data: Results for orders placed or performed in visit on 06/04/22  Microscopic Examination   Urine  Result Value Ref Range   WBC, UA 0-5 0 - 5 /hpf   RBC, Urine 3-10 (A) 0 - 2 /hpf   Epithelial Cells (non renal) 0-10 0 - 10 /hpf   Bacteria, UA Few None seen/Few  Urinalysis, Complete  Result Value Ref Range   Specific Gravity, UA 1.010 1.005 - 1.030   pH, UA 6.0 5.0 - 7.5   Color, UA Yellow Yellow   Appearance Ur Clear Clear   Leukocytes,UA Negative Negative   Protein,UA Negative Negative/Trace   Glucose, UA Negative Negative   Ketones, UA Negative Negative   RBC, UA 1+ (A) Negative   Bilirubin, UA Negative Negative   Urobilinogen, Ur 0.2 0.2 - 1.0 mg/dL   Nitrite, UA Negative Negative   Microscopic Examination See below:   Bladder Scan (Post Void Residual) in office  Result Value Ref Range   Scan Result 163    Assessment & Plan:   1. Urinary incontinence, mixed Possible diarrhea on Gemtesa. Will try low dose Myrbetriq and see if she tolerates it better. If she fails both beta 3 agonists, I do not recommend anticholinergics due to age and dry eye, would need to think about 3rd line therapies.  We discussed that cystocele can contribute to urinary incontinence, however there was no evidence of this per Dr. Edison Pace recent note. I offered pelvic exam  today and she prefers to defer given upcoming cysto as below. - Bladder Scan (Post Void Residual) in office - mirabegron ER (MYRBETRIQ) 25 MG TB24 tablet; Take 1 tablet (25 mg total) by mouth daily.  Dispense: 28 tablet; Refill: 0  2. Microscopic hematuria Persistent MH today. Patient desires cysto to rule out underlying bladder malignancy. Will schedule. -  Urinalysis, Complete  3. Incomplete bladder emptying Noted on bladder scan today, possibly contributory to #1 above. Will continue to monitor.   Return in about 4 weeks (around 07/02/2022) for Cysto with Dr. Apolinar Junes.  Carman Ching, PA-C  [facility] [place] [address], [address] [place], [place] [address] [phone]

## 2022-06-04 NOTE — Patient Instructions (Signed)

## 2022-06-05 ENCOUNTER — Encounter: Payer: Self-pay | Admitting: Internal Medicine

## 2022-06-05 DIAGNOSIS — R3129 Other microscopic hematuria: Secondary | ICD-10-CM | POA: Insufficient documentation

## 2022-06-05 DIAGNOSIS — E113393 Type 2 diabetes mellitus with moderate nonproliferative diabetic retinopathy without macular edema, bilateral: Secondary | ICD-10-CM | POA: Diagnosis not present

## 2022-06-05 DIAGNOSIS — R32 Unspecified urinary incontinence: Secondary | ICD-10-CM | POA: Insufficient documentation

## 2022-06-05 MED ORDER — MIRABEGRON ER 25 MG PO TB24
25.0000 mg | ORAL_TABLET | Freq: Every day | ORAL | 0 refills | Status: DC
Start: 2022-06-05 — End: 2022-07-08

## 2022-06-22 ENCOUNTER — Inpatient Hospital Stay
Admission: EM | Admit: 2022-06-22 | Discharge: 2022-06-24 | DRG: 392 | Disposition: A | Discharge status: Home / Self Care | Payer: Medicare PPO | Attending: Internal Medicine | Admitting: Internal Medicine

## 2022-06-22 ENCOUNTER — Encounter: Payer: Self-pay | Admitting: Internal Medicine

## 2022-06-22 ENCOUNTER — Emergency Department: Payer: Medicare PPO

## 2022-06-22 ENCOUNTER — Other Ambulatory Visit: Payer: Self-pay

## 2022-06-22 DIAGNOSIS — Z7984 Long term (current) use of oral hypoglycemic drugs: Secondary | ICD-10-CM | POA: Diagnosis not present

## 2022-06-22 DIAGNOSIS — K8020 Calculus of gallbladder without cholecystitis without obstruction: Secondary | ICD-10-CM | POA: Diagnosis present

## 2022-06-22 DIAGNOSIS — Z8249 Family history of ischemic heart disease and other diseases of the circulatory system: Secondary | ICD-10-CM

## 2022-06-22 DIAGNOSIS — R112 Nausea with vomiting, unspecified: Secondary | ICD-10-CM

## 2022-06-22 DIAGNOSIS — Z79899 Other long term (current) drug therapy: Secondary | ICD-10-CM | POA: Diagnosis not present

## 2022-06-22 DIAGNOSIS — I159 Secondary hypertension, unspecified: Secondary | ICD-10-CM

## 2022-06-22 DIAGNOSIS — N3281 Overactive bladder: Secondary | ICD-10-CM | POA: Diagnosis present

## 2022-06-22 DIAGNOSIS — R109 Unspecified abdominal pain: Secondary | ICD-10-CM

## 2022-06-22 DIAGNOSIS — R1032 Left lower quadrant pain: Secondary | ICD-10-CM

## 2022-06-22 DIAGNOSIS — Z79890 Hormone replacement therapy (postmenopausal): Secondary | ICD-10-CM | POA: Diagnosis not present

## 2022-06-22 DIAGNOSIS — E669 Obesity, unspecified: Secondary | ICD-10-CM | POA: Diagnosis not present

## 2022-06-22 DIAGNOSIS — E039 Hypothyroidism, unspecified: Secondary | ICD-10-CM

## 2022-06-22 DIAGNOSIS — K219 Gastro-esophageal reflux disease without esophagitis: Secondary | ICD-10-CM | POA: Diagnosis present

## 2022-06-22 DIAGNOSIS — Z880 Allergy status to penicillin: Secondary | ICD-10-CM | POA: Diagnosis not present

## 2022-06-22 DIAGNOSIS — I10 Essential (primary) hypertension: Secondary | ICD-10-CM

## 2022-06-22 DIAGNOSIS — E1165 Type 2 diabetes mellitus with hyperglycemia: Secondary | ICD-10-CM

## 2022-06-22 DIAGNOSIS — Z882 Allergy status to sulfonamides status: Secondary | ICD-10-CM | POA: Diagnosis not present

## 2022-06-22 DIAGNOSIS — Z881 Allergy status to other antibiotic agents status: Secondary | ICD-10-CM | POA: Diagnosis not present

## 2022-06-22 DIAGNOSIS — E119 Type 2 diabetes mellitus without complications: Secondary | ICD-10-CM | POA: Diagnosis not present

## 2022-06-22 DIAGNOSIS — E890 Postprocedural hypothyroidism: Secondary | ICD-10-CM | POA: Diagnosis present

## 2022-06-22 DIAGNOSIS — E7800 Pure hypercholesterolemia, unspecified: Secondary | ICD-10-CM

## 2022-06-22 DIAGNOSIS — K567 Ileus, unspecified: Secondary | ICD-10-CM | POA: Diagnosis not present

## 2022-06-22 DIAGNOSIS — Z6834 Body mass index (BMI) 34.0-34.9, adult: Secondary | ICD-10-CM | POA: Diagnosis not present

## 2022-06-22 DIAGNOSIS — R197 Diarrhea, unspecified: Secondary | ICD-10-CM | POA: Diagnosis not present

## 2022-06-22 DIAGNOSIS — K5730 Diverticulosis of large intestine without perforation or abscess without bleeding: Secondary | ICD-10-CM | POA: Diagnosis not present

## 2022-06-22 DIAGNOSIS — K760 Fatty (change of) liver, not elsewhere classified: Secondary | ICD-10-CM | POA: Diagnosis not present

## 2022-06-22 DIAGNOSIS — R001 Bradycardia, unspecified: Secondary | ICD-10-CM | POA: Diagnosis not present

## 2022-06-22 DIAGNOSIS — Z803 Family history of malignant neoplasm of breast: Secondary | ICD-10-CM | POA: Diagnosis not present

## 2022-06-22 DIAGNOSIS — K529 Noninfective gastroenteritis and colitis, unspecified: Secondary | ICD-10-CM | POA: Diagnosis present

## 2022-06-22 HISTORY — DX: Type 2 diabetes mellitus without complications: E11.9

## 2022-06-22 LAB — TROPONIN I (HIGH SENSITIVITY): Troponin I (High Sensitivity): 5 ng/L (ref <18)

## 2022-06-22 LAB — URINALYSIS, ROUTINE W REFLEX MICROSCOPIC
Bilirubin Urine: NEGATIVE
Glucose, UA: NEGATIVE mg/dL
Ketones, ur: NEGATIVE mg/dL
Leukocytes,Ua: NEGATIVE
Nitrite: NEGATIVE
Protein, ur: NEGATIVE mg/dL
Specific Gravity, Urine: >1.046 — ABNORMAL HIGH (ref 1.005–1.030)
pH: 6 (ref 5.0–8.0)

## 2022-06-22 LAB — LIPASE, BLOOD: Lipase: 35 U/L (ref 11–51)

## 2022-06-22 LAB — COMPREHENSIVE METABOLIC PANEL
ALT: 17 U/L (ref 0–44)
AST: 19 U/L (ref 15–41)
Albumin: 4.3 g/dL (ref 3.5–5.0)
Alkaline Phosphatase: 60 U/L (ref 38–126)
Anion gap: 9 (ref 5–15)
BUN: 11 mg/dL (ref 8–23)
CO2: 24 mmol/L (ref 22–32)
Calcium: 9.6 mg/dL (ref 8.9–10.3)
Chloride: 105 mmol/L (ref 98–111)
Creatinine, Ser: 0.71 mg/dL (ref 0.44–1.00)
GFR, Estimated: >60 mL/min (ref >60)
Glucose, Bld: 132 mg/dL — ABNORMAL HIGH (ref 70–99)
Potassium: 3.9 mmol/L (ref 3.5–5.1)
Sodium: 138 mmol/L (ref 135–145)
Total Bilirubin: 0.7 mg/dL (ref 0.3–1.2)
Total Protein: 7.7 g/dL (ref 6.5–8.1)

## 2022-06-22 LAB — CBG MONITORING, ED: Glucose-Capillary: 110 mg/dL — ABNORMAL HIGH (ref 70–99)

## 2022-06-22 LAB — APTT: aPTT: 27 seconds (ref 24–36)

## 2022-06-22 LAB — CBC
HCT: 44.5 % (ref 36.0–46.0)
Hemoglobin: 14.6 g/dL (ref 12.0–15.0)
MCH: 29.2 pg (ref 26.0–34.0)
MCHC: 32.8 g/dL (ref 30.0–36.0)
MCV: 89 fL (ref 80.0–100.0)
Platelets: 277 10*3/uL (ref 150–400)
RBC: 5 MIL/uL (ref 3.87–5.11)
RDW: 12.7 % (ref 11.5–15.5)
WBC: 11.7 10*3/uL — ABNORMAL HIGH (ref 4.0–10.5)
nRBC: 0 % (ref 0.0–0.2)

## 2022-06-22 LAB — GLUCOSE, CAPILLARY
Glucose-Capillary: 115 mg/dL — ABNORMAL HIGH (ref 70–99)
Glucose-Capillary: 107 mg/dL — ABNORMAL HIGH (ref 70–99)

## 2022-06-22 LAB — PROTIME-INR
INR: 1.1 (ref 0.8–1.2)
Prothrombin Time: 14 seconds (ref 11.4–15.2)

## 2022-06-22 MED ORDER — SODIUM CHLORIDE 0.9 % IV SOLN
6.2500 mg | Freq: Four times a day (QID) | INTRAVENOUS | Status: DC | PRN
  Administered 2022-06-22: 6.25 mg via INTRAVENOUS
  Filled 2022-06-22: qty 0.25

## 2022-06-22 MED ORDER — OXYCODONE-ACETAMINOPHEN 5-325 MG PO TABS: 1.0000 | ORAL_TABLET | ORAL | Status: DC | PRN

## 2022-06-22 MED ORDER — ONDANSETRON HCL 4 MG/2ML IJ SOLN
4.0000 mg | INTRAMUSCULAR | Status: AC
  Administered 2022-06-22: 4 mg via INTRAVENOUS
  Filled 2022-06-22: qty 2

## 2022-06-22 MED ORDER — HEPARIN SODIUM (PORCINE) 5000 UNIT/ML IJ SOLN
5000.0000 [IU] | Freq: Three times a day (TID) | INTRAMUSCULAR | Status: DC
  Administered 2022-06-22 – 2022-06-24 (×6): 5000 [IU] via SUBCUTANEOUS
  Filled 2022-06-22 (×5): qty 1

## 2022-06-22 MED ORDER — ONDANSETRON HCL 4 MG/2ML IJ SOLN
4.0000 mg | Freq: Three times a day (TID) | INTRAMUSCULAR | Status: DC | PRN
  Administered 2022-06-22: 4 mg via INTRAVENOUS
  Filled 2022-06-22: qty 2

## 2022-06-22 MED ORDER — IOHEXOL 300 MG/ML  SOLN
100.0000 mL | Freq: Once | INTRAMUSCULAR | Status: AC | PRN
  Administered 2022-06-22: 100 mL via INTRAVENOUS

## 2022-06-22 MED ORDER — HYDRALAZINE HCL 20 MG/ML IJ SOLN: 5.0000 mg | INTRAMUSCULAR | Status: DC | PRN

## 2022-06-22 MED ORDER — MORPHINE SULFATE (PF) 2 MG/ML IV SOLN
0.5000 mg | INTRAVENOUS | Status: DC | PRN
  Administered 2022-06-22: 0.5 mg via INTRAVENOUS
  Filled 2022-06-22: qty 1

## 2022-06-22 MED ORDER — IRBESARTAN 150 MG PO TABS
75.0000 mg | ORAL_TABLET | Freq: Every day | ORAL | Status: DC
  Administered 2022-06-23 – 2022-06-24 (×2): 75 mg via ORAL
  Filled 2022-06-22 (×2): qty 1

## 2022-06-22 MED ORDER — ONDANSETRON HCL 4 MG/2ML IJ SOLN
4.0000 mg | Freq: Once | INTRAMUSCULAR | Status: AC
  Administered 2022-06-22: 4 mg via INTRAVENOUS
  Filled 2022-06-22: qty 2

## 2022-06-22 MED ORDER — SODIUM CHLORIDE 0.9 % IV SOLN: INTRAVENOUS | Status: DC

## 2022-06-22 MED ORDER — MIRABEGRON ER 25 MG PO TB24
25.0000 mg | ORAL_TABLET | Freq: Every day | ORAL | Status: DC
  Administered 2022-06-23 – 2022-06-24 (×2): 25 mg via ORAL
  Filled 2022-06-22 (×2): qty 1

## 2022-06-22 MED ORDER — ACETAMINOPHEN 325 MG PO TABS
650.0000 mg | ORAL_TABLET | Freq: Four times a day (QID) | ORAL | Status: DC | PRN
  Administered 2022-06-23 (×2): 650 mg via ORAL
  Filled 2022-06-22 (×2): qty 2

## 2022-06-22 MED ORDER — INSULIN ASPART 100 UNIT/ML IJ SOLN: 0.0000 [IU] | Freq: Every day | INTRAMUSCULAR | Status: DC

## 2022-06-22 MED ORDER — LEVOTHYROXINE SODIUM 100 MCG/5ML IV SOLN
50.0000 ug | Freq: Every day | INTRAVENOUS | Status: DC
  Administered 2022-06-22 – 2022-06-23 (×2): 50 ug via INTRAVENOUS
  Filled 2022-06-22 (×3): qty 5

## 2022-06-22 MED ORDER — PROPRANOLOL HCL 40 MG PO TABS
40.0000 mg | ORAL_TABLET | Freq: Two times a day (BID) | ORAL | Status: DC
  Administered 2022-06-22 – 2022-06-24 (×3): 40 mg via ORAL
  Filled 2022-06-22 (×4): qty 1

## 2022-06-22 MED ORDER — INSULIN ASPART 100 UNIT/ML IJ SOLN
0.0000 [IU] | Freq: Three times a day (TID) | INTRAMUSCULAR | Status: DC
  Administered 2022-06-23 (×2): 1 [IU] via SUBCUTANEOUS

## 2022-06-22 NOTE — ED Notes (Signed)
Patient currently vomiting.

## 2022-06-22 NOTE — ED Provider Notes (Signed)
[facility] Provider Note    Event Date/Time   First MD Initiated Contact with Patient 06/22/22 0915     (approximate)   History   Abdominal Pain and Diarrhea   HPI  Veronica Branch is a 82 y.o. female history hypertension diverticulitis  Patient reports last night started having upset stomach started with the pain in her lower abdomen.  Then now pain in her mid abdomen associated with nausea vomiting last night intermittent vomiting and loose watery stools.  No chest pain or shortness of breath.  No trouble breathing.  No fever.  Feeling mostly nausea now, reports the pain is subsided completely.  She continues to feel nauseated cannot keep anything on her stomach unable to keep her medications down today.  Diarrhea comes and goes and is loose, seems to fluctuate between vomiting and diarrhea  She has a history of hernia, it is not grown in size or become painful   No headache, no numbness, no chest pain.   Physical Exam   Triage Vital Signs: ED Triage Vitals  Enc Vitals Group     BP 06/22/22 0757 (!) 182/89     Pulse Rate 06/22/22 0757 70     Resp 06/22/22 0757 20     Temp --      Temp src --      SpO2 06/22/22 0757 97 %     Weight 06/22/22 0749 178 lb (80.7 kg)     Height 06/22/22 0749 5' (1.524 m)     Head Circumference --      Peak Flow --      Pain Score 06/22/22 0749 8     Pain Loc --      Pain Edu? --      Excl. in GC? --     Most recent vital signs: Vitals:   06/22/22 0940 06/22/22 1050  BP: (!) 133/113   Pulse: (!) 55   Resp: 13   Temp:  98.4 F (36.9 C)  SpO2: 97%      General: Awake, no distress.  Does not appear in distress. CV:  Good peripheral perfusion.   Resp:  Normal effort.  Clear Abd:  No distention.  Soft nontender nondistended through all quadrants.  No groin pain or masses.  She has a soft ventral hernia that is nontender without overlying skin change or solidification.  She reports this is her normal  hernia does not appear to be abnormal.  She reports nausea to palpation and does have small amount of active slightly bilious appearing emesis Other:     ED Results / Procedures / Treatments   Labs (all labs ordered are listed, but only abnormal results are displayed) Labs Reviewed  COMPREHENSIVE METABOLIC PANEL - Abnormal; Notable for the following components:      Result Value   Glucose, Bld 132 (*)    All other components within normal limits  CBC - Abnormal; Notable for the following components:   WBC 11.7 (*)    All other components within normal limits  GASTROINTESTINAL PANEL BY PCR, STOOL (REPLACES STOOL CULTURE)  C DIFFICILE QUICK SCREEN W PCR REFLEX    LIPASE, BLOOD  URINALYSIS, ROUTINE W REFLEX MICROSCOPIC  PROTIME-INR  APTT  TROPONIN I (HIGH SENSITIVITY)     EKG  Interpreted by me at 11 AM heart rate 55 QRS 90 QTc 400 Normal sinus rhythm, no evidence of acute ischemia.  Mild nonspecific T wave abnormality including slight depression and inversion in  V2 V3 and V6. (Have ordered add on troponin, patient denies any chest pain or obvious symptoms of ACS however)   RADIOLOGY  CT abdomen pelvis is interpreted by me for acute gross pathology with notable dilated loops of bowel, but no obvious SBO   CT ABDOMEN PELVIS W CONTRAST  Result Date: 06/22/2022 CLINICAL DATA:  82 year old female with history of abdominal pain. Suspected bowel obstruction. EXAM: CT ABDOMEN AND PELVIS WITH CONTRAST TECHNIQUE: Multidetector CT imaging of the abdomen and pelvis was performed using the standard protocol following bolus administration of intravenous contrast. RADIATION DOSE REDUCTION: This exam was performed according to the departmental dose-optimization program which includes automated exposure control, adjustment of the mA and/or kV according to patient size and/or use of iterative reconstruction technique. CONTRAST:  OMNIPAQUE IOHEXOL 300 MG/ML  SOLN COMPARISON:  CT of the abdomen  and pelvis 06/15/2020. FINDINGS: Lower chest: Cardiomegaly. Hepatobiliary: No suspicious cystic or solid hepatic lesions. No intra or extrahepatic biliary ductal dilatation. Gallbladder is unremarkable in appearance. Pancreas: No pancreatic mass. No pancreatic ductal dilatation. No pancreatic or peripancreatic fluid collections or inflammatory changes. Spleen: Unremarkable. Adrenals/Urinary Tract: Bilateral kidneys and bilateral adrenal glands are normal in appearance. No hydroureteronephrosis. Urinary bladder is unremarkable in appearance. Stomach/Bowel: The appearance of the stomach is normal. Multiple dilated loops of proximal and mid small bowel are noted, measuring up to 3.5 cm in diameter, with multiple air-fluid levels. There is progressive transition to nondilated small bowel loops in the region of the mid jejunum where there is substantial bowel wall thickening, best appreciated on axial image 39 of series 2. Distal aspect of the small bowel is otherwise relatively decompressed. There is some gas and stool throughout the colon and rectum. Numerous colonic diverticuli are noted, without surrounding inflammatory changes to suggest an acute diverticulitis at this time. Normal appendix. Vascular/Lymphatic: Atherosclerosis in the abdominal aorta and pelvic vasculature, without evidence of aneurysm or dissection. No lymphadenopathy noted in the abdomen or pelvis. Reproductive: Uterus and ovaries are unremarkable in appearance. Other: Small supraumbilical ventral hernia containing only omental fat. Small volume of ascites. No pneumoperitoneum. Musculoskeletal: There are no aggressive appearing lytic or blastic lesions noted in the visualized portions of the skeleton. IMPRESSION: 1. Although there are several mildly dilated loops of proximal and mid small bowel, there is mural thickening in the mid jejunum suggesting enteritis. Dilated loops of small bowel are favored to reflect an associated ileus, although  partial small bowel obstruction is not entirely excluded. 2. Colonic diverticulosis without evidence of acute diverticulitis at this time. 3. Small volume of ascites. 4. Supraumbilical ventral hernia containing only omental fat. 5. Aortic atherosclerosis. 6. Cardiomegaly. 7. Additional incidental findings, as above. Electronically Signed   By: Trudie Reed M.D.   On: 06/22/2022 08:44      PROCEDURES:  Critical Care performed: No  Procedures   MEDICATIONS ORDERED IN ED: Medications  morphine (PF) 2 MG/ML injection 0.5 mg (has no administration in time range)  oxyCODONE-acetaminophen (PERCOCET/ROXICET) 5-325 MG per tablet 1 tablet (has no administration in time range)  ondansetron (ZOFRAN) injection 4 mg (has no administration in time range)  hydrALAZINE (APRESOLINE) injection 5 mg (has no administration in time range)  acetaminophen (TYLENOL) tablet 650 mg (has no administration in time range)  0.9 %  sodium chloride infusion ( Intravenous New Bag/Given 06/22/22 1142)  insulin aspart (novoLOG) injection 0-9 Units (has no administration in time range)  insulin aspart (novoLOG) injection 0-5 Units (has no administration in time range)  heparin injection 5,000 Units (has no administration in time range)  ondansetron (ZOFRAN) injection 4 mg (4 mg Intravenous Given 06/22/22 0758)  iohexol (OMNIPAQUE) 300 MG/ML solution 100 mL (100 mLs Intravenous Contrast Given 06/22/22 0824)  ondansetron (ZOFRAN) injection 4 mg (4 mg Intravenous Given 06/22/22 1051)     IMPRESSION / MDM / ASSESSMENT AND PLAN / ED COURSE  I reviewed the triage vital signs and the nursing notes.                              Differential diagnosis includes but is not limited to, abdominal perforation, aortic dissection, cholecystitis, appendicitis, diverticulitis, colitis, esophagitis/gastritis, kidney stone, pyelonephritis, urinary tract infection, aortic aneurysm. All are considered in decision and treatment plan. Based upon  the patient's presentation and risk factors, proceeded with obtaining labs and CT imaging.  Given the clinical history and examination concern for possible nausea vomiting diarrhea gastroenteritis, enteritis, diverticulitis are high on the differential.  She does not have any symptoms would be highly suggestive ischemia.  She is no longer having any associated pain.  There is no evidence of incarcerated hernia.  She does not have any neurologic cardiac or vascular symptoms.  She does have bilious emesis with loose stools.  CT imaging was reviewed and concerning for possible area of ileus with associated area of thickening possible enteritis though CT imaging is not conclusive of 100% cause   Patient's presentation is most consistent with acute complicated illness / injury requiring diagnostic workup.   The patient is on the cardiac monitor to evaluate for evidence of arrhythmia and/or significant heart rate changes.  Suspect hypertension may be secondary to unremitting nausea vomiting.  Will continue to monitor her blood pressure at this time she has no symptoms of be highly suggestive of acute hypertensive emergency and I suspect this is again likely secondary to active GI process.  Discussed with patient and she is understanding agreeable with plan for admission.  Discussed with Dr. Clyde Lundborg, and further admitted workup including follow-up on stool studies, serial examinations, etc. we followed by the hospitalist service.    No obvious finding of obstruction at this time, based on clinical history and exam suspect more likely ileus with associated enteritis.  However patient will have ongoing follow-up reassessment and serial examinations with hospitalist  FINAL CLINICAL IMPRESSION(S) / ED DIAGNOSES   Final diagnoses:  Nausea vomiting and diarrhea  Ileus (HCC)  Secondary hypertension  Intractable nausea and vomiting     Rx / DC Orders   ED Discharge Orders     None        Note:   This document was prepared using Dragon voice recognition software and may include unintentional dictation errors.   Sharyn Creamer, MD 06/22/22 1143

## 2022-06-22 NOTE — Consult Note (Signed)
[facility] SURGICAL CONSULTATION NOTE (initial) - cpt: 99254   HISTORY OF PRESENT ILLNESS (HPI):  82 y.o. female presented to [facility] ED today for evaluation of ileus versus partial SBO versus enteritis. Patient reports abdominal pain, associated nausea, vomiting and diarrhea.  She reports onset of left lower quadrant abdominal pain recurred yesterday following a dinner of soup and applesauce.  She has had some diarrhea over time this is nothing new.  She is also had waxing and waning constipation, with improved abdominal pain following bowel activity.  She is currently having loose stools.  She blames her loose stools on.  At times when she takes MiraLAX on top of her metformin.  She has had worsening pain discomfort after having a burger.  She actually began bilious emesis after her arrival in the ER.  She did not have oral contrast but following IV contrast and CT scanning.  She reports no prior abdominal surgery. Her last ultrasound was nearly 9 years ago without evidence of gallstones.  CT abdomen is noted below.  Surgery is consulted by hospitalist physician Dr. Clyde Lundborg in this context for evaluation and management of possible partial SBO.  PAST MEDICAL HISTORY (PMH):  Past Medical History:  Diagnosis Date   Diabetes mellitus without complication (HCC)    Diverticulosis    GERD (gastroesophageal reflux disease)    Hiatal hernia    History of migraine headaches    Hypercholesterolemia    Hyperglycemia    Hypertension    Hypothyroidism    s/p removal of right thyroid lobe and isthmus (1992)     PAST SURGICAL HISTORY (PSH):  Past Surgical History:  Procedure Laterality Date   BREAST EXCISIONAL BIOPSY Right 2000   benign   THYROID LOBECTOMY  1992   s/p removal of right thyroid lobe and isthmus     MEDICATIONS:  Prior to Admission medications   Medication Sig Start Date End Date Taking? Authorizing Provider  ACCU-CHEK GUIDE test strip USE TO TEST BLOOD SUGAR ONCE  DAILY 06/28/19   Dale [place], MD  blood glucose meter kit and supplies KIT Dispense based on patient and insurance preference. Use to check sugars once daily. Dx E11.9 04/06/19   Dale [place], MD  ezetimibe (ZETIA) 10 MG tablet Take 1 tablet (10 mg total) by mouth daily. 04/29/22   Dale [place], MD  famciclovir (FAMVIR) 500 MG tablet Take 1 tablet (500 mg total) by mouth 3 (three) times daily. 06/07/21   Sherlene Shams, MD  Lancets (ONETOUCH ULTRASOFT) lancets Use to test blood sugars 1-2 times daily   E11.9 03/01/14   Dale [place], MD  levothyroxine (SYNTHROID) 88 MCG tablet Take 1 tablet (88 mcg total) by mouth daily before breakfast. 04/29/22   Dale [place], MD  metFORMIN (GLUCOPHAGE-XR) 500 MG 24 hr tablet Take one tablet twice a day 04/29/22   Dale Gerty, MD  mirabegron ER (MYRBETRIQ) 25 MG TB24 tablet Take 1 tablet (25 mg total) by mouth daily. 06/05/22   Vaillancourt, Lelon Mast, PA-C  nystatin cream (MYCOSTATIN) Apply 1 Application topically 2 (two) times daily. 02/07/22   Dale Boyd, MD  polyethylene glycol (MIRALAX / GLYCOLAX) packet Take 17 g by mouth daily. Mix one tablespoon with 8oz of your favorite juice or water every day until you are having soft formed stools. Then start taking once daily if you didn't have a stool the day before. 03/12/17   Willy Eddy, MD  propranolol (INDERAL) 40 MG tablet TAKE 1 TABLET BY MOUTH TWICE A DAY 05/11/22  Dale [place], MD  telmisartan (MICARDIS) 20 MG tablet Take 1 tablet (20 mg total) by mouth daily. 04/29/22   Dale [place], MD  triamcinolone ointment (KENALOG) 0.1 % Apply 1 Application topically as needed. 02/17/22   [provider]  Blood Glucose Monitoring Suppl (ONE TOUCH ULTRA SYSTEM KIT) W/DEVICE KIT 1 kit by Does not apply route once. 03/01/14 04/06/19  Dale Ponce, MD     ALLERGIES:  Allergies  Allergen Reactions   Ciprofloxacin Hcl Other (See Comments)    tendonitis   Penicillins Hives    Urticaria     Sulfa Antibiotics Rash     SOCIAL HISTORY:  Social History   Socioeconomic History   Marital status: Married    Spouse name: Not on file   Number of children: 2   Years of education: Not on file   Highest education level: Not on file  Occupational History   Occupation: retired Runner, broadcasting/film/video  Tobacco Use   Smoking status: Never   Smokeless tobacco: Never  Vaping Use   Vaping Use: Never used  Substance and Sexual Activity   Alcohol use: No    Alcohol/week: 0.0 standard drinks of alcohol   Drug use: No   Sexual activity: Yes  Other Topics Concern   Not on file  Social History Narrative   Regularly exercises. Retired and married.    Social Determinants of Health   Financial Resource Strain: Not on file  Food Insecurity: No Food Insecurity (06/22/2022)   Hunger Vital Sign    Worried About Running Out of Food in the Last Year: Never true    Ran Out of Food in the Last Year: Never true  Transportation Needs: No Transportation Needs (06/22/2022)   PRAPARE - Administrator, Civil Service (Medical): No    Lack of Transportation (Non-Medical): No  Physical Activity: Not on file  Stress: Not on file  Social Connections: Not on file  Intimate Partner Violence: Not At Risk (06/22/2022)   Humiliation, Afraid, Rape, and Kick questionnaire    Fear of Current or Ex-Partner: No    Emotionally Abused: No    Physically Abused: No    Sexually Abused: No     FAMILY HISTORY:  Family History  Problem Relation Age of Onset   Coronary artery disease Mother        myocardial infarction and CHF   Breast cancer Mother        62's   Breast cancer Maternal Aunt        x 2   Breast cancer Sister        21's   Breast cancer Cousin        maternal side   Breast cancer Other 87   Colon cancer Neg Hx       REVIEW OF SYSTEMS:  ROS General: no fevers, chills, no body weight gain, has poor appetite, has fatigue HEENT: no blurry vision, hearing changes or sore throat Respiratory: no  dyspnea, coughing, wheezing CV: no chest pain, no palpitations GI: has nausea, vomiting, abdominal pain, diarrhea, no constipation GU: no dysuria, burning on urination, increased urinary frequency, hematuria  Ext: no leg edema Neuro: no unilateral weakness, numbness, or tingling, no vision change or hearing loss Skin: no rash, no skin tear. MSK: No muscle spasm, no deformity, no limitation of range of movement in spin Heme: No easy bruising.  Travel history: No recent long distant travel.   VITAL SIGNS:  Temp:  [98.3 F (36.8 C)-98.6 F (37  C)] 98.6 F (37 C) (06/02 1535) Pulse Rate:  [51-70] 61 (06/02 1535) Resp:  [13-20] 18 (06/02 1535) BP: (133-205)/(73-113) 160/98 (06/02 1535) SpO2:  [95 %-98 %] 98 % (06/02 1535) Weight:  [80.7 kg] 80.7 kg (06/02 0749)     Height: 5' (152.4 cm) Weight: 80.7 kg BMI (Calculated): 34.76   INTAKE/OUTPUT:  No intake/output data recorded.  PHYSICAL EXAM:  Physical Exam Blood pressure (!) 160/98, pulse 61, temperature 98.6 F (37 C), resp. rate 18, height 5' (1.524 m), weight 80.7 kg, SpO2 98 %. Last Weight  Most recent update: 06/22/2022  7:49 AM    Weight  80.7 kg (178 lb)             CONSTITUTIONAL: Well developed, and nourished, appropriately responsive and aware without distress.  Currently vomiting bilious emesis into bag. EYES: Sclera non-icteric.   EARS, NOSE, MOUTH AND THROAT:  The oropharynx is clear. Oral mucosa is pink and moist.     Hearing is intact to voice.  NECK: Trachea is midline, and there is no jugular venous distension.  LYMPH NODES:  Lymph nodes in the neck are not enlarged. RESPIRATORY:  Lungs are clear, and breath sounds are equal bilaterally.   Normal respiratory effort without pathologic use of accessory muscles. CARDIOVASCULAR: Heart is regular in rate and rhythm.   Well perfused.  GI: The abdomen is soft, nontender, and nondistended. There were no palpable masses. I did not appreciate hepatosplenomegaly. There  were normal bowel sounds. There is a supraumbilical bulge consistent with a hernia noted on CT.  The hernia site is nontender. MUSCULOSKELETAL:  Symmetrical muscle tone appreciated in all four extremities. Warm without edema.  SKIN: Skin turgor is normal. No pathologic skin lesions appreciated.  NEUROLOGIC:  Motor and sensation appear grossly normal.  Cranial nerves are grossly without defect. PSYCH:  Alert and oriented to person, place and time. Affect is appropriate for situation.  Data Reviewed I have personally reviewed what is currently available of the patient's imaging, recent labs and medical records.    Labs:     Latest Ref Rng & Units 06/22/2022    7:59 AM 05/02/2021   10:44 AM 06/15/2020    4:24 PM  CBC  WBC 4.0 - 10.5 K/uL 11.7  7.7  9.1   Hemoglobin 12.0 - 15.0 g/dL 40.9  81.1  91.4   Hematocrit 36.0 - 46.0 % 44.5  40.2  42.2   Platelets 150 - 400 K/uL 277  249.0  233       Latest Ref Rng & Units 06/22/2022    7:59 AM 04/24/2022   10:27 AM 01/21/2022   11:40 AM  CMP  Glucose 70 - 99 mg/dL 782  956  213   BUN 8 - 23 mg/dL 11  10  11    Creatinine 0.44 - 1.00 mg/dL 0.86  5.78  4.69   Sodium 135 - 145 mmol/L 138  142  140   Potassium 3.5 - 5.1 mmol/L 3.9  4.0  4.3   Chloride 98 - 111 mmol/L 105  105  103   CO2 22 - 32 mmol/L 24  30  28    Calcium 8.9 - 10.3 mg/dL 9.6  9.2  9.4   Total Protein 6.5 - 8.1 g/dL 7.7  6.5  6.7   Total Bilirubin 0.3 - 1.2 mg/dL 0.7  0.6  0.4   Alkaline Phos 38 - 126 U/L 60  80  80   AST 15 - 41 U/L 19  10  10   ALT 0 - 44 U/L 17  12  12       Imaging studies:   Last 24 hrs: CT ABDOMEN PELVIS W CONTRAST  Result Date: 06/22/2022 CLINICAL DATA:  82 year old female with history of abdominal pain. Suspected bowel obstruction. EXAM: CT ABDOMEN AND PELVIS WITH CONTRAST TECHNIQUE: Multidetector CT imaging of the abdomen and pelvis was performed using the standard protocol following bolus administration of intravenous contrast. RADIATION DOSE REDUCTION:  This exam was performed according to the departmental dose-optimization program which includes automated exposure control, adjustment of the mA and/or kV according to patient size and/or use of iterative reconstruction technique. CONTRAST:  OMNIPAQUE IOHEXOL 300 MG/ML  SOLN COMPARISON:  CT of the abdomen and pelvis 06/15/2020. FINDINGS: Lower chest: Cardiomegaly. Hepatobiliary: No suspicious cystic or solid hepatic lesions. No intra or extrahepatic biliary ductal dilatation. Gallbladder is unremarkable in appearance. Pancreas: No pancreatic mass. No pancreatic ductal dilatation. No pancreatic or peripancreatic fluid collections or inflammatory changes. Spleen: Unremarkable. Adrenals/Urinary Tract: Bilateral kidneys and bilateral adrenal glands are normal in appearance. No hydroureteronephrosis. Urinary bladder is unremarkable in appearance. Stomach/Bowel: The appearance of the stomach is normal. Multiple dilated loops of proximal and mid small bowel are noted, measuring up to 3.5 cm in diameter, with multiple air-fluid levels. There is progressive transition to nondilated small bowel loops in the region of the mid jejunum where there is substantial bowel wall thickening, best appreciated on axial image 39 of series 2. Distal aspect of the small bowel is otherwise relatively decompressed. There is some gas and stool throughout the colon and rectum. Numerous colonic diverticuli are noted, without surrounding inflammatory changes to suggest an acute diverticulitis at this time. Normal appendix. Vascular/Lymphatic: Atherosclerosis in the abdominal aorta and pelvic vasculature, without evidence of aneurysm or dissection. No lymphadenopathy noted in the abdomen or pelvis. Reproductive: Uterus and ovaries are unremarkable in appearance. Other: Small supraumbilical ventral hernia containing only omental fat. Small volume of ascites. No pneumoperitoneum. Musculoskeletal: There are no aggressive appearing lytic or  blastic lesions noted in the visualized portions of the skeleton. IMPRESSION: 1. Although there are several mildly dilated loops of proximal and mid small bowel, there is mural thickening in the mid jejunum suggesting enteritis. Dilated loops of small bowel are favored to reflect an associated ileus, although partial small bowel obstruction is not entirely excluded. 2. Colonic diverticulosis without evidence of acute diverticulitis at this time. 3. Small volume of ascites. 4. Supraumbilical ventral hernia containing only omental fat. 5. Aortic atherosclerosis. 6. Cardiomegaly. 7. Additional incidental findings, as above. Electronically Signed   By: Trudie Reed M.D.   On: 06/22/2022 08:44     Assessment/Plan:  82 y.o. female with possible ileus versus partial small bowel obstruction, complicated by pertinent comorbidities including:  Patient Active Problem List   Diagnosis Date Noted   Abdominal pain 06/22/2022   Diabetes mellitus without complication (HCC) 06/22/2022   Obesity with body mass index (BMI) of 30.0 to 39.9 06/22/2022   Urinary incontinence 06/05/2022   Microscopic hematuria 06/05/2022   Mixed stress and urge urinary incontinence 05/07/2022   Statin myopathy 05/04/2022   Vaginal irritation 01/25/2022   Vaginal lesion 01/25/2022   Left knee pain 09/12/2021   Shingles rash 06/07/2021   Hernia, umbilical 04/21/2021   Dysuria 12/18/2020   Urinary urgency 07/31/2019   Left hip pain 07/14/2019   Aortic atherosclerosis (HCC) 05/17/2017   Loose stools 03/21/2017   Recurrent UTI 01/30/2016   Abdominal bloating 12/23/2015  Healthcare maintenance 06/12/2014   Vitamin D deficiency 02/09/2014   Osteopenia 11/25/2011   History of migraine headaches 11/25/2011   Hypercholesterolemia 11/25/2011   Hypertension 11/25/2011   Diabetes (HCC) 11/25/2011   Hypothyroidism 11/25/2011    -Although there was fluid in her stomach noted on CT scan, believe this is all been vomited at this  point.  Suspect there is no need for nasogastric decompression at this time.  May placement as needed should vomiting persist.  -IV fluid support, n.p.o. status for now.  -Monitor electrolytes, serial exams and labs.  - DVT prophylaxis/PPI prophylaxis.  -Not anticipating any immediate surgery, we will follow with you.  Would repeat KUB in AM.  -Though not related to this admission, she does admit to having some fatty food intolerance, and I believe an ultrasound is warranted to rule out cholelithiasis.  All of the above findings and recommendations were discussed with the patient and  family(if present), and all of patient's and present family's questions were answered to their expressed satisfaction.  Thank you for the opportunity to participate in this patient's care.   -- Campbell Lerner, M.D., FACS 06/22/2022, 4:50 PM

## 2022-06-22 NOTE — H&P (Addendum)
History and Physical    Veronica Branch:[id number] DOB: [birth year] DOA: 06/22/2022  Referring MD/NP/PA:   PCP: Dale Emajagua, MD   Patient coming from:  The patient is coming from home.     Chief Complaint: Abdominal pain, nausea, vomiting, diarrhea  HPI: Veronica Branch is a 82 y.o. female with medical history significant of hypertension, hyperlipidemia, diabetes mellitus, hypothyroidism, obesity, diverticulitis, migraine, overactive bladder, who presents with abdominal pain, nausea vomiting, diarrhea.  Her symptoms started yesterday.  She has nausea, vomiting, abdominal pain and diarrhea.  Patient has had numerous times of bilious nonbloody vomiting, few times of watery diarrhea with small amount of stool.  Her abdominal pain is located in lower abdomen, intermittent, aching, mild to moderate, nonradiating.  Denies fever or chills.  Patient does not have chest pain, SOB and cough.  Denies symptoms of UTI.  Data reviewed independently and ED Course: pt was found to have WBC 11.7, GFR> 60, temperature normal, blood pressure 2 5/76 --> 133/113, heart rate 50-70, RR 20, oxygen saturation 97% on room air.  CT of abdomen/pelvis showed possible enteritis versus ileus versus partial SBO.  Patient is admitted to MedSurg bed as inpatient.  Dr. Dolores Frame of surgery is consulted.   CT-abd/pelvis 1. Although there are several mildly dilated loops of proximal and mid small bowel, there is mural thickening in the mid jejunum suggesting enteritis. Dilated loops of small bowel are favored to reflect an associated ileus, although partial small bowel obstruction is not entirely excluded. 2. Colonic diverticulosis without evidence of acute diverticulitis at this time. 3. Small volume of ascites. 4. Supraumbilical ventral hernia containing only omental fat. 5. Aortic atherosclerosis. 6. Cardiomegaly. 7. Additional incidental findings, as above.    EKG: I have personally reviewed.  Sinus  rhythm, QTc 392, low voltage, nonspecific T wave change.   Review of Systems:   General: no fevers, chills, no body weight gain, has poor appetite, has fatigue HEENT: no blurry vision, hearing changes or sore throat Respiratory: no dyspnea, coughing, wheezing CV: no chest pain, no palpitations GI: has nausea, vomiting, abdominal pain, diarrhea, no constipation GU: no dysuria, burning on urination, increased urinary frequency, hematuria  Ext: no leg edema Neuro: no unilateral weakness, numbness, or tingling, no vision change or hearing loss Skin: no rash, no skin tear. MSK: No muscle spasm, no deformity, no limitation of range of movement in spin Heme: No easy bruising.  Travel history: No recent long distant travel.   Allergy:  Allergies  Allergen Reactions   Ciprofloxacin Hcl Other (See Comments)    tendonitis   Penicillins Hives    Urticaria    Sulfa Antibiotics Rash    Past Medical History:  Diagnosis Date   Diverticulosis    GERD (gastroesophageal reflux disease)    Hiatal hernia    History of migraine headaches    Hypercholesterolemia    Hyperglycemia    Hypertension    Hypothyroidism    s/p removal of right thyroid lobe and isthmus (1992)    Past Surgical History:  Procedure Laterality Date   BREAST EXCISIONAL BIOPSY Right 2000   benign   THYROID LOBECTOMY  1992   s/p removal of right thyroid lobe and isthmus    Social History:  reports that she has never smoked. She has never used smokeless tobacco. She reports that she does not drink alcohol and does not use drugs.  Family History:  Family History  Problem Relation Age of Onset   Coronary artery disease  Mother        myocardial infarction and CHF   Breast cancer Mother        27's   Breast cancer Maternal Aunt        x 2   Breast cancer Sister        20's   Breast cancer Cousin        maternal side   Breast cancer Other 85   Colon cancer Neg Hx      Prior to Admission medications    Medication Sig Start Date End Date Taking? Authorizing Provider  ACCU-CHEK GUIDE test strip USE TO TEST BLOOD SUGAR ONCE DAILY 06/28/19   Dale [place], MD  blood glucose meter kit and supplies KIT Dispense based on patient and insurance preference. Use to check sugars once daily. Dx E11.9 04/06/19   Dale [place], MD  ezetimibe (ZETIA) 10 MG tablet Take 1 tablet (10 mg total) by mouth daily. 04/29/22   Dale [place], MD  famciclovir (FAMVIR) 500 MG tablet Take 1 tablet (500 mg total) by mouth 3 (three) times daily. 06/07/21   Sherlene Shams, MD  Lancets (ONETOUCH ULTRASOFT) lancets Use to test blood sugars 1-2 times daily   E11.9 03/01/14   Dale [place], MD  levothyroxine (SYNTHROID) 88 MCG tablet Take 1 tablet (88 mcg total) by mouth daily before breakfast. 04/29/22   Dale [place], MD  metFORMIN (GLUCOPHAGE-XR) 500 MG 24 hr tablet Take one tablet twice a day 04/29/22   Dale [place], MD  mirabegron ER (MYRBETRIQ) 25 MG TB24 tablet Take 1 tablet (25 mg total) by mouth daily. 06/05/22   Vaillancourt, Lelon Mast, PA-C  nystatin cream (MYCOSTATIN) Apply 1 Application topically 2 (two) times daily. 02/07/22   Dale [place], MD  polyethylene glycol (MIRALAX / GLYCOLAX) packet Take 17 g by mouth daily. Mix one tablespoon with 8oz of your favorite juice or water every day until you are having soft formed stools. Then start taking once daily if you didn't have a stool the day before. 03/12/17   Willy Eddy, MD  propranolol (INDERAL) 40 MG tablet TAKE 1 TABLET BY MOUTH TWICE A DAY 05/11/22   Dale [place], MD  telmisartan (MICARDIS) 20 MG tablet Take 1 tablet (20 mg total) by mouth daily. 04/29/22   Dale , MD  triamcinolone ointment (KENALOG) 0.1 % Apply 1 Application topically as needed. 02/17/22   [provider]  Blood Glucose Monitoring Suppl (ONE TOUCH ULTRA SYSTEM KIT) W/DEVICE KIT 1 kit by Does not apply route once. 03/01/14 04/06/19  Dale , MD    Physical  Exam: Vitals:   06/22/22 0757 06/22/22 0930 06/22/22 0940 06/22/22 1050  BP: (!) 182/89 (!) 205/76 (!) 133/113   Pulse: 70 (!) 51 (!) 55   Resp: 20  13   Temp:    98.4 F (36.9 C)  TempSrc:    Oral  SpO2: 97% 95% 97%   Weight:      Height:       General: Not in acute distress HEENT:       Eyes: PERRL, EOMI, no jaundice       ENT: No discharge from the ears and nose, no pharynx injection, no tonsillar enlargement.        Neck: No JVD, no bruit, no mass felt. Heme: No neck lymph node enlargement. Cardiac: S1/S2, RRR, No murmurs, No gallops or rubs. Respiratory: No rales, wheezing, rhonchi or rubs. GI: Soft, nondistended, has tenderness in lower abdomen, no rebound pain, no organomegaly, BS  present. GU: No hematuria Ext: No pitting leg edema bilaterally. 1+DP/PT pulse bilaterally. Musculoskeletal: No joint deformities, No joint redness or warmth, no limitation of ROM in spin. Skin: No rashes.  Neuro: Alert, oriented X3, cranial nerves II-XII grossly intact, moves all extremities normally. Psych: Patient is not psychotic, no suicidal or hemocidal ideation.  Labs on Admission: I have personally reviewed following labs and imaging studies  CBC: Recent Labs  Lab 06/22/22 0759  WBC 11.7*  HGB 14.6  HCT 44.5  MCV 89.0  PLT 277   Basic Metabolic Panel: Recent Labs  Lab 06/22/22 0759  NA 138  K 3.9  CL 105  CO2 24  GLUCOSE 132*  BUN 11  CREATININE 0.71  CALCIUM 9.6   GFR: Estimated Creatinine Clearance: 52.8 mL/min (by C-G formula based on SCr of 0.71 mg/dL). Liver Function Tests: Recent Labs  Lab 06/22/22 0759  AST 19  ALT 17  ALKPHOS 60  BILITOT 0.7  PROT 7.7  ALBUMIN 4.3   Recent Labs  Lab 06/22/22 0759  LIPASE 35   No results for input(s): "AMMONIA" in the last 168 hours. Coagulation Profile: No results for input(s): "INR", "PROTIME" in the last 168 hours. Cardiac Enzymes: No results for input(s): "CKTOTAL", "CKMB", "CKMBINDEX", "TROPONINI" in the  last 168 hours. BNP (last 3 results) No results for input(s): "PROBNP" in the last 8760 hours. HbA1C: No results for input(s): "HGBA1C" in the last 72 hours. CBG: Recent Labs  Lab 06/22/22 1216  GLUCAP 110*   Lipid Profile: No results for input(s): "CHOL", "HDL", "LDLCALC", "TRIG", "CHOLHDL", "LDLDIRECT" in the last 72 hours. Thyroid Function Tests: No results for input(s): "TSH", "T4TOTAL", "FREET4", "T3FREE", "THYROIDAB" in the last 72 hours. Anemia Panel: No results for input(s): "VITAMINB12", "FOLATE", "FERRITIN", "TIBC", "IRON", "RETICCTPCT" in the last 72 hours. Urine analysis:    Component Value Date/Time   COLORURINE YELLOW 12/18/2020 1243   APPEARANCEUR Clear 06/04/2022 1510   LABSPEC 1.025 12/18/2020 1243   PHURINE 5.5 12/18/2020 1243   GLUCOSEU Negative 06/04/2022 1510   GLUCOSEU NEGATIVE 12/18/2020 1243   HGBUR MODERATE (A) 12/18/2020 1243   BILIRUBINUR Negative 06/04/2022 1510   KETONESUR NEGATIVE 12/18/2020 1243   PROTEINUR Negative 06/04/2022 1510   PROTEINUR NEGATIVE 06/15/2020 2043   UROBILINOGEN 0.2 12/18/2020 1243   NITRITE Negative 06/04/2022 1510   NITRITE NEGATIVE 12/18/2020 1243   LEUKOCYTESUR Negative 06/04/2022 1510   LEUKOCYTESUR NEGATIVE 12/18/2020 1243   Sepsis Labs: @LABRCNTIP (procalcitonin:4,lacticidven:4) )No results found for this or any previous visit (from the past 240 hour(s)).   Radiological Exams on Admission: CT ABDOMEN PELVIS W CONTRAST  Result Date: 06/22/2022 CLINICAL DATA:  82 year old female with history of abdominal pain. Suspected bowel obstruction. EXAM: CT ABDOMEN AND PELVIS WITH CONTRAST TECHNIQUE: Multidetector CT imaging of the abdomen and pelvis was performed using the standard protocol following bolus administration of intravenous contrast. RADIATION DOSE REDUCTION: This exam was performed according to the departmental dose-optimization program which includes automated exposure control, adjustment of the mA and/or kV  according to patient size and/or use of iterative reconstruction technique. CONTRAST:  OMNIPAQUE IOHEXOL 300 MG/ML  SOLN COMPARISON:  CT of the abdomen and pelvis 06/15/2020. FINDINGS: Lower chest: Cardiomegaly. Hepatobiliary: No suspicious cystic or solid hepatic lesions. No intra or extrahepatic biliary ductal dilatation. Gallbladder is unremarkable in appearance. Pancreas: No pancreatic mass. No pancreatic ductal dilatation. No pancreatic or peripancreatic fluid collections or inflammatory changes. Spleen: Unremarkable. Adrenals/Urinary Tract: Bilateral kidneys and bilateral adrenal glands are normal in appearance. No hydroureteronephrosis.  Urinary bladder is unremarkable in appearance. Stomach/Bowel: The appearance of the stomach is normal. Multiple dilated loops of proximal and mid small bowel are noted, measuring up to 3.5 cm in diameter, with multiple air-fluid levels. There is progressive transition to nondilated small bowel loops in the region of the mid jejunum where there is substantial bowel wall thickening, best appreciated on axial image 39 of series 2. Distal aspect of the small bowel is otherwise relatively decompressed. There is some gas and stool throughout the colon and rectum. Numerous colonic diverticuli are noted, without surrounding inflammatory changes to suggest an acute diverticulitis at this time. Normal appendix. Vascular/Lymphatic: Atherosclerosis in the abdominal aorta and pelvic vasculature, without evidence of aneurysm or dissection. No lymphadenopathy noted in the abdomen or pelvis. Reproductive: Uterus and ovaries are unremarkable in appearance. Other: Small supraumbilical ventral hernia containing only omental fat. Small volume of ascites. No pneumoperitoneum. Musculoskeletal: There are no aggressive appearing lytic or blastic lesions noted in the visualized portions of the skeleton. IMPRESSION: 1. Although there are several mildly dilated loops of proximal and mid small  bowel, there is mural thickening in the mid jejunum suggesting enteritis. Dilated loops of small bowel are favored to reflect an associated ileus, although partial small bowel obstruction is not entirely excluded. 2. Colonic diverticulosis without evidence of acute diverticulitis at this time. 3. Small volume of ascites. 4. Supraumbilical ventral hernia containing only omental fat. 5. Aortic atherosclerosis. 6. Cardiomegaly. 7. Additional incidental findings, as above. Electronically Signed   By: Trudie Reed M.D.   On: 06/22/2022 08:44      Assessment/Plan Principal Problem:   Abdominal pain Active Problems:   Hypertension   Hypercholesterolemia   Hypothyroidism   Diabetes mellitus without complication (HCC)   Obesity with body mass index (BMI) of 30.0 to 39.9   Assessment and Plan:  Abdominal pain: Etiology is not clear. CT of abdomen/pelvis showed possible enteritis versus ileus versus partial SBO.  Patient continues to have intractable bilious vomiting, I am concerning for possible small bowel obstruction.  Consulted Dr. Claudine Mouton of surgery  -Admitted to MedSurg bed as inpatient -N.p.o. -As needed Zofran and Phenergan alternatively for severe nausea -As needed morphine, Percocet for abdominal pain -May need NG tube placement, follow-up Dr. Benson [place] recommendation -IV fluid: 75 cc/h of normal saline -f/u C diff and GI path panel  Hypertension -IV hydralazine as needed -Propranolol -Change Micardis to irbesartan in hospital  Hypercholesterolemia: Patient is not taking her Zetia now -Follow-up with PCP  Hypothyroidism -Switch oral Synthroid to IV, current dose from 88 to 50 mcg daily  Diabetes mellitus without complication (HCC): Recent A1c 6.9, well-controlled.  Patient is taking metformin at home -SSI  Obesity with body mass index (BMI) of 30.0 to 39.9: Body weight 80.7 kg, BMI 34.  76 -Encourage losing weight -Exercise and healthy diet      DVT ppx: SQ  Heparin    Code Status: Full code     Family Communication:  Yes, patient's husband  at bed side.     Disposition Plan:  Anticipate discharge back to previous environment  Consults called: Dr. Claudine Mouton of surgery  Admission status and Level of care: Med-Surg:    as inpt       Dispo: The patient is from: Home              Anticipated d/c is to: Home              Anticipated d/c date is: 2 days  Patient currently is not medically stable to d/c.    Severity of Illness:  The appropriate patient status for this patient is INPATIENT. Inpatient status is judged to be reasonable and necessary in order to provide the required intensity of service to ensure the patient's safety. The patient's presenting symptoms, physical exam findings, and initial radiographic and laboratory data in the context of their chronic comorbidities is felt to place them at high risk for further clinical deterioration. Furthermore, it is not anticipated that the patient will be medically stable for discharge from the hospital within 2 midnights of admission.   * I certify that at the point of admission it is my clinical judgment that the patient will require inpatient hospital care spanning beyond 2 midnights from the point of admission due to high intensity of service, high risk for further deterioration and high frequency of surveillance required.*       Date of Service 06/22/2022    Lorretta Harp [facility]   If 7PM-7AM, please contact night-coverage [email or web address] 06/22/2022, 1:26 PM

## 2022-06-22 NOTE — ED Provider Notes (Signed)
.     Sharyn Creamer, MD 06/22/22 1339

## 2022-06-22 NOTE — ED Triage Notes (Signed)
Pt reports has severe pain in her stomach area that started yesterday. Pt states history of diverticulitis so thought it may be that but once the diarrhea started that pain went away and now she feels constipated. Pt reports discomfort comes and goes. Pt reports last diarrhea episode was 30 minutes ago but only a little bit.

## 2022-06-23 ENCOUNTER — Inpatient Hospital Stay: Payer: Medicare PPO

## 2022-06-23 DIAGNOSIS — K567 Ileus, unspecified: Secondary | ICD-10-CM | POA: Diagnosis not present

## 2022-06-23 LAB — BASIC METABOLIC PANEL
Anion gap: 7 (ref 5–15)
BUN: 10 mg/dL (ref 8–23)
CO2: 25 mmol/L (ref 22–32)
Calcium: 8.6 mg/dL — ABNORMAL LOW (ref 8.9–10.3)
Chloride: 108 mmol/L (ref 98–111)
Creatinine, Ser: 0.67 mg/dL (ref 0.44–1.00)
GFR, Estimated: >60 mL/min (ref >60)
Glucose, Bld: 93 mg/dL (ref 70–99)
Potassium: 3.5 mmol/L (ref 3.5–5.1)
Sodium: 140 mmol/L (ref 135–145)

## 2022-06-23 LAB — GLUCOSE, CAPILLARY
Glucose-Capillary: 82 mg/dL (ref 70–99)
Glucose-Capillary: 124 mg/dL — ABNORMAL HIGH (ref 70–99)
Glucose-Capillary: 127 mg/dL — ABNORMAL HIGH (ref 70–99)
Glucose-Capillary: 95 mg/dL (ref 70–99)

## 2022-06-23 LAB — CBC
HCT: 42.6 % (ref 36.0–46.0)
Hemoglobin: 13.7 g/dL (ref 12.0–15.0)
MCH: 29.5 pg (ref 26.0–34.0)
MCHC: 32.2 g/dL (ref 30.0–36.0)
MCV: 91.8 fL (ref 80.0–100.0)
Platelets: 182 10*3/uL (ref 150–400)
RBC: 4.64 MIL/uL (ref 3.87–5.11)
RDW: 12.7 % (ref 11.5–15.5)
WBC: 8.1 10*3/uL (ref 4.0–10.5)
nRBC: 0 % (ref 0.0–0.2)

## 2022-06-23 MED ORDER — LEVOTHYROXINE SODIUM 88 MCG PO TABS
88.0000 ug | ORAL_TABLET | Freq: Every day | ORAL | Status: DC
  Administered 2022-06-24: 88 ug via ORAL
  Filled 2022-06-23: qty 1

## 2022-06-23 NOTE — Progress Notes (Signed)
Patient returned from ultrasound in stable condition.

## 2022-06-23 NOTE — Progress Notes (Signed)
Pt returned from xray. Veronica Branch

## 2022-06-23 NOTE — Progress Notes (Signed)
Patient to ultra sound in stable condition.

## 2022-06-23 NOTE — Progress Notes (Signed)
Off unit to xray. Veronica Branch

## 2022-06-23 NOTE — Progress Notes (Signed)
[facility] SURGICAL PROGRESS NOTE (cpt [phone])  Hospital Day(s): 1.   Interval History: Patient seen and examined, no acute events or new complaints overnight. Patient reports she feels "100% better." No abdominal pain, nausea, emesis, fever, chills. She remains without leukocytosis; WBC 8.1K. Hgb to 13.7. Renal function normal; sCr - 0.67; UO - unmeasured. No electrolyte derangements. KUB without significant gaseous small bowel distension, air seen in colon.   Did have additional discussion this morning regarding GI symptoms noted at home previous. This seems to be intermittent crampy abdominal pain and fluctuating from diarrhea to constipation. She denied any significant post-prandial pain. She is wondering if this is attributed to her metformin.   Review of Systems:  Constitutional: denies fever, chills  HEENT: denies cough or congestion  Respiratory: denies any shortness of breath  Cardiovascular: denies chest pain or palpitations  Gastrointestinal: denies abdominal pain, N/V Genitourinary: denies burning with urination or urinary frequency  Vital signs in last 24 hours: [min-max] current  Temp:  [98 F (36.7 C)-98.6 F (37 C)] 98 F (36.7 C) (06/03 0417) Pulse Rate:  [51-70] 53 (06/03 0417) Resp:  [13-20] 18 (06/03 0417) BP: (133-205)/(65-113) 161/65 (06/03 0417) SpO2:  [95 %-98 %] 96 % (06/03 0417) Weight:  [80.7 kg] 80.7 kg (06/02 0749)     Height: 5' (152.4 cm) Weight: 80.7 kg BMI (Calculated): 34.76   Intake/Output last 2 shifts:  06/02 0701 - 06/03 0700 In: 641.3 [I.V.:521.3; IV Piggyback:120] Out: -    Physical Exam:  Constitutional: alert, cooperative and no distress  HENT: normocephalic without obvious abnormality  Eyes: PERRL, EOM's grossly intact and symmetric  Respiratory: breathing non-labored at rest  Cardiovascular: regular rate and sinus rhythm  Gastrointestinal: soft, non-tender, and non-distended, no rebound/guarding. Murphy's Sign  negative. She is certainly without peritonitis Musculoskeletal: no edema or wounds, motor and sensation grossly intact, NT    Labs:     Latest Ref Rng & Units 06/23/2022    6:06 AM 06/22/2022    7:59 AM 05/02/2021   10:44 AM  CBC  WBC 4.0 - 10.5 K/uL 8.1  11.7  7.7   Hemoglobin 12.0 - 15.0 g/dL 60.4  54.0  98.1   Hematocrit 36.0 - 46.0 % 42.6  44.5  40.2   Platelets 150 - 400 K/uL 182  277  249.0       Latest Ref Rng & Units 06/23/2022    6:06 AM 06/22/2022    7:59 AM 04/24/2022   10:27 AM  CMP  Glucose 70 - 99 mg/dL 93  191  478   BUN 8 - 23 mg/dL 10  11  10    Creatinine 0.44 - 1.00 mg/dL 2.95  6.21  3.08   Sodium 135 - 145 mmol/L 140  138  142   Potassium 3.5 - 5.1 mmol/L 3.5  3.9  4.0   Chloride 98 - 111 mmol/L 108  105  105   CO2 22 - 32 mmol/L 25  24  30    Calcium 8.9 - 10.3 mg/dL 8.6  9.6  9.2   Total Protein 6.5 - 8.1 g/dL  7.7  6.5   Total Bilirubin 0.3 - 1.2 mg/dL  0.7  0.6   Alkaline Phos 38 - 126 U/L  60  80   AST 15 - 41 U/L  19  10   ALT 0 - 44 U/L  17  12     Imaging studies:   KUB (06/23/2022) personally reviewed without significant small bowel distension,  air seen in colon, and radiologist report reviewed below:  IMPRESSION: 1. Unremarkable bowel gas pattern. Please refer to recent abdominal CT describing mid jejunal wall thickening concerning for enteritis.   RUQ [place] (06/23/2022) personally reviewed, cholelithiasis, no evidence of cholecystitis, and radiologist report reviewed below:  IMPRESSION: 1. Cholelithiasis. No evidence for cholecystitis. 2. Hepatic steatosis.   Assessment/Plan: (ICD-10's: K90.7) 82 y.o. female with clinically resolved enteritis vs gastroenteritis. Low suspicion for SBO given rapid improvement and lack of previous intra-abdominal surgeries. Also with asymptomatic cholelithiasis without cholecystitis    - I think it is reasonable to resume diet; CLD now. Soft diet for lunch  - No role for NGT decompression; No role for surgical  intervention - I do not think her cholelithiasis is the source of her pain nor her current presentation. It does not sound like these have been obviously symptomatic at home and there is no evidence of cholecystitis this admission. We will follow up outpatient in a few weeks to reassess and determine need/interest for cholecystectomy, if at all. Instructions given   - Monitor abdominal examination; on-going bowel function   - Pain control prn; antiemetics prn - Mobilize - Further management per primary service  - Discharge Planning; Nothing further from surgical perspective; ADAT. We will follow up in 3-4 weeks as outpatient regarding cholelithiasis. Please call with questions/concerns.    All of the above findings and recommendations were discussed with the patient, patient's family at bedside, and the medical team, and all of patient's and family's questions were answered to their expressed satisfaction.  -- Lynden Oxford, PA-C [facility] 06/23/2022, 7:12 AM M-F: 7am - 4pm

## 2022-06-23 NOTE — Progress Notes (Signed)
  Progress Note   Patient: Veronica Branch ZOX:[id number] DOB: 04-08-1940 DOA: 06/22/2022     1 DOS: the patient was seen and examined on 06/23/2022   Brief hospital course: NAINIKA BENCIVENGO is a 82 y.o. female with medical history significant of hypertension, hyperlipidemia, diabetes mellitus, hypothyroidism, obesity, diverticulitis, migraine, overactive bladder, who presents with abdominal pain, nausea vomiting, diarrhea. CT abdomen/ pelvis showed dilated loops, enteritis. Uls abdomen showed cholelithiasis. Admitted for further management with surgery consultation.  Assessment and Plan: Abdomina pain, nausea/ vomiting diarrhea- Possibly due to enteritis. Unlikely SBO or cholecystitis. Surgery evaluation and follow up appreciated. No surgical intervention. Will advance diet as tolerated. Stop IV fluids. No loose stools, hold off on getting GI panel. Outpatient surgery clinic follow up  Hypertension BP stable. Home medications resumed. Hydralazine PRN.  Type 2 diabetes - Accuchecks, sliding scale insulin. Hold oral hypoglycemics.  Hypothyroid- Continue home dose oral synthroid.  Obesity with BMI 34.76- Diet, exercise and weight reduction advised.  DVT prophylaxis - Heparin. PT evaluation. Encourage out of bet to chair.     Subjective: Patient is seen and examined today morning. She is lying in bed. No abdominal pain, nausea. Feeling better. No diarrhea and so stool sample not collected for C.diff or GI panel.  Physical Exam: Vitals:   06/22/22 1535 06/22/22 2013 06/23/22 0417 06/23/22 0813  BP: (!) 160/98 (!) 157/68 (!) 161/65 (!) 111/56  Pulse: 61 63 (!) 53 (!) 54  Resp: 18 20 18 16   Temp: 98.6 F (37 C) 98.2 F (36.8 C) 98 F (36.7 C) 98.2 F (36.8 C)  TempSrc:    Oral  SpO2: 98% 96% 96% 96%  Weight:      Height:       General - Elderly obese Caucasian female, no acute distress. HEENT- PERRLA, EOMI, non tender sinuses. Heart- S1, S2 heard, no murmurs  Lungs-  distant breath sounds, bibasal rales. Abdomen- soft, non tender, bowel sounds good. Neuro- AAOx3, non focal exam.  Data Reviewed:  CBC, BMP,   Family Communication: Patient and family, daughter, husband at bedside. Discussed her care plan, they understand and agree.  Disposition: Status is: Inpatient Remains inpatient appropriate because: diet advance, serial abdominal exam  Planned Discharge Destination: Home    Time spent: 43 minutes  Author: Marcelino Duster, MD 06/23/2022 10:54 AM  For on call review [email or web address].

## 2022-06-24 LAB — GLUCOSE, CAPILLARY
Glucose-Capillary: 105 mg/dL — ABNORMAL HIGH (ref 70–99)
Glucose-Capillary: 105 mg/dL — ABNORMAL HIGH (ref 70–99)

## 2022-06-24 MED ORDER — OMEPRAZOLE MAGNESIUM 20 MG PO TBEC
40.0000 mg | DELAYED_RELEASE_TABLET | Freq: Every day | ORAL | 1 refills | Status: AC
Start: 2022-06-24 — End: 2022-07-24

## 2022-06-24 MED ORDER — ALUM & MAG HYDROXIDE-SIMETH 200-200-20 MG/5ML PO SUSP
30.0000 mL | Freq: Once | ORAL | Status: DC
  Filled 2022-06-24: qty 30

## 2022-06-24 MED ORDER — ONDANSETRON HCL 4 MG PO TABS: 4.0000 mg | ORAL_TABLET | Freq: Three times a day (TID) | ORAL | 0 refills | Status: DC | PRN

## 2022-06-24 NOTE — Discharge Instructions (Signed)

## 2022-06-24 NOTE — Discharge Summary (Signed)
Physician Discharge Summary   Patient: Veronica Branch MRN: [id number] DOB: 1940/04/07  Admit date:     06/22/2022  Discharge date: 06/24/22  Discharge Physician: Marcelino Duster   PCP: Dale [place], MD   Recommendations at discharge:    PCP follow up in 1 week. Surgery clinic follow up as scheduled.  Discharge Diagnoses: Principal Problem:   Abdominal pain Active Problems:   Hypertension   Hypercholesterolemia   Hypothyroidism   Diabetes mellitus without complication (HCC)   Obesity with body mass index (BMI) of 30.0 to 39.9   Nausea vomiting and diarrhea   Ileus (HCC)  Resolved Problems:   * No resolved hospital problems. *  Hospital Course: Veronica Branch is a 82 y.o. female with medical history significant of hypertension, hyperlipidemia, diabetes mellitus, hypothyroidism, obesity, diverticulitis, migraine, overactive bladder, who presents with abdominal pain, nausea vomiting, diarrhea. CT abdomen/ pelvis showed dilated loops, enteritis. Uls abdomen showed cholelithiasis. Admitted for further management with surgery consultation. Surgery team advised supportive care, no acute surgical intervention, outpatient follow up to determine need/interest for cholecystectomy. She did well with IV hydration, antiemetics and pain medications. Diarrhea, nausea improved. Blood sugars, electrolytes closely monitored. She did not move bowels while on the floor, no stool specimen collected. She does have bloating, advised constipation regimen. Worked with PT, no needs. Remains hemodynamically stable to be discharged home. She asked about PPI, zofran scripts, which were sent. Advised PCP follow up, surgery clinic follow up as scheduled. Patient and husband understand and agree with discharge plan.       Consultants: Surgery ([facility]) Procedures performed: none   Disposition: Home Diet recommendation:  Discharge Diet Orders (From admission, onward)     Start      Ordered   06/24/22 0000  Diet - low sodium heart healthy        06/24/22 1124           Cardiac and Carb modified diet DISCHARGE MEDICATION: Allergies as of 06/24/2022       Reactions   Ciprofloxacin Hcl Other (See Comments)   tendonitis   Penicillins Hives   Urticaria   Sulfa Antibiotics Rash        Medication List     TAKE these medications    Accu-Chek Guide test strip Generic drug: glucose blood USE TO TEST BLOOD SUGAR ONCE DAILY   blood glucose meter kit and supplies Kit Dispense based on patient and insurance preference. Use to check sugars once daily. Dx E11.9   ezetimibe 10 MG tablet Commonly known as: Zetia Take 1 tablet (10 mg total) by mouth daily.   levothyroxine 88 MCG tablet Commonly known as: SYNTHROID Take 1 tablet (88 mcg total) by mouth daily before breakfast.   metFORMIN 500 MG 24 hr tablet Commonly known as: GLUCOPHAGE-XR Take one tablet twice a day What changed:  how much to take how to take this when to take this   mirabegron ER 25 MG Tb24 tablet Commonly known as: MYRBETRIQ Take 1 tablet (25 mg total) by mouth daily.   nystatin cream Commonly known as: MYCOSTATIN Apply 1 Application topically 2 (two) times daily.   omeprazole 20 MG tablet Commonly known as: PriLOSEC OTC Take 2 tablets (40 mg total) by mouth daily.   ondansetron 4 MG tablet Commonly known as: Zofran Take 1 tablet (4 mg total) by mouth every 8 (eight) hours as needed for nausea or vomiting.   onetouch ultrasoft lancets Use to test blood sugars 1-2 times daily  E11.9   polyethylene glycol 17 g packet Commonly known as: MIRALAX / GLYCOLAX Take 17 g by mouth daily. Mix one tablespoon with 8oz of your favorite juice or water every day until you are having soft formed stools. Then start taking once daily if you didn't have a stool the day before.   propranolol 40 MG tablet Commonly known as: INDERAL TAKE 1 TABLET BY MOUTH TWICE A DAY   telmisartan 20 MG  tablet Commonly known as: MICARDIS Take 1 tablet (20 mg total) by mouth daily.   triamcinolone ointment 0.1 % Commonly known as: KENALOG Apply 1 Application topically as needed.        Follow-up Information     Campbell Lerner, MD. Go on 07/29/2022.   Specialty: General Surgery Why: @  1:30pm  Hospital follow up; cholelithiasis Contact information: [address] [place] [address] [phone]                Discharge Exam: Ceasar Mons Weights   06/22/22 0749  Weight: 80.7 kg   General - Elderly obese Caucasian female, no acute distress. HEENT- PERRLA, EOMI, non tender sinuses. Heart- S1, S2 heard, no murmurs       Lungs- distant breath sounds, bibasal rales. Abdomen- soft, non tender, bowel sounds good. Neuro- AAOx3, non focal exam.  Condition at discharge: stable  The results of significant diagnostics from this hospitalization (including imaging, microbiology, ancillary and laboratory) are listed below for reference.   Imaging Studies: US Abdomen Limited RUQ (LIVER/GB)  Result Date: 06/23/2022 CLINICAL DATA:  Fatty food intolerance. EXAM: ULTRASOUND ABDOMEN LIMITED RIGHT UPPER QUADRANT COMPARISON:  CT abdomen pelvis 06/22/2022 FINDINGS: Gallbladder: Echogenic material layering in the gallbladder with posterior acoustic shadowing. Findings are compatible with small gallstones. No gallbladder wall thickening or pericholecystic fluid. Negative for sonographic Murphy sign. No significant gallbladder distension. Common bile duct: Diameter: 0.3 cm Liver: No focal lesion. Increased echogenicity in the liver. Poor visualization of the internal architecture. Findings are suggestive for steatosis. No intrahepatic biliary dilatation. Portal vein is patent on color Doppler imaging with normal direction of blood flow towards the liver. Other: None. IMPRESSION: 1. Cholelithiasis. No evidence for cholecystitis. 2. Hepatic steatosis. Electronically Signed   By: Richarda Overlie M.D.   On: 06/23/2022 08:05   DG Abd 1 View  Result Date: 06/23/2022 CLINICAL DATA:  Abdominal pain EXAM: ABDOMEN - 1 VIEW COMPARISON:  06/22/2022 FINDINGS: Two supine frontal views of the abdomen and pelvis are obtained. Bowel gas pattern is unremarkable without evidence of obstruction or ileus. No masses or abnormal calcifications. No acute bony abnormalities. IMPRESSION: 1. Unremarkable bowel gas pattern. Please refer to recent abdominal CT describing mid jejunal wall thickening concerning for enteritis. Electronically Signed   By: Sharlet Salina M.D.   On: 06/23/2022 02:45   CT ABDOMEN PELVIS W CONTRAST  Result Date: 06/22/2022 CLINICAL DATA:  82 year old female with history of abdominal pain. Suspected bowel obstruction. EXAM: CT ABDOMEN AND PELVIS WITH CONTRAST TECHNIQUE: Multidetector CT imaging of the abdomen and pelvis was performed using the standard protocol following bolus administration of intravenous contrast. RADIATION DOSE REDUCTION: This exam was performed according to the departmental dose-optimization program which includes automated exposure control, adjustment of the mA and/or kV according to patient size and/or use of iterative reconstruction technique. CONTRAST:  OMNIPAQUE IOHEXOL 300 MG/ML  SOLN COMPARISON:  CT of the abdomen and pelvis 06/15/2020. FINDINGS: Lower chest: Cardiomegaly. Hepatobiliary: No suspicious cystic or solid hepatic lesions. No intra or extrahepatic  biliary ductal dilatation. Gallbladder is unremarkable in appearance. Pancreas: No pancreatic mass. No pancreatic ductal dilatation. No pancreatic or peripancreatic fluid collections or inflammatory changes. Spleen: Unremarkable. Adrenals/Urinary Tract: Bilateral kidneys and bilateral adrenal glands are normal in appearance. No hydroureteronephrosis. Urinary bladder is unremarkable in appearance. Stomach/Bowel: The appearance of the stomach is normal. Multiple dilated loops of proximal and mid small bowel are  noted, measuring up to 3.5 cm in diameter, with multiple air-fluid levels. There is progressive transition to nondilated small bowel loops in the region of the mid jejunum where there is substantial bowel wall thickening, best appreciated on axial image 39 of series 2. Distal aspect of the small bowel is otherwise relatively decompressed. There is some gas and stool throughout the colon and rectum. Numerous colonic diverticuli are noted, without surrounding inflammatory changes to suggest an acute diverticulitis at this time. Normal appendix. Vascular/Lymphatic: Atherosclerosis in the abdominal aorta and pelvic vasculature, without evidence of aneurysm or dissection. No lymphadenopathy noted in the abdomen or pelvis. Reproductive: Uterus and ovaries are unremarkable in appearance. Other: Small supraumbilical ventral hernia containing only omental fat. Small volume of ascites. No pneumoperitoneum. Musculoskeletal: There are no aggressive appearing lytic or blastic lesions noted in the visualized portions of the skeleton. IMPRESSION: 1. Although there are several mildly dilated loops of proximal and mid small bowel, there is mural thickening in the mid jejunum suggesting enteritis. Dilated loops of small bowel are favored to reflect an associated ileus, although partial small bowel obstruction is not entirely excluded. 2. Colonic diverticulosis without evidence of acute diverticulitis at this time. 3. Small volume of ascites. 4. Supraumbilical ventral hernia containing only omental fat. 5. Aortic atherosclerosis. 6. Cardiomegaly. 7. Additional incidental findings, as above. Electronically Signed   By: Trudie Reed M.D.   On: 06/22/2022 08:44    Microbiology: Results for orders placed or performed in visit on 06/04/22  Microscopic Examination     Status: Abnormal   Collection Time: 06/04/22  3:10 PM   Urine  Result Value Ref Range Status   WBC, UA 0-5 0 - 5 /hpf Final   RBC, Urine 3-10 (A) 0 - 2 /hpf Final    Epithelial Cells (non renal) 0-10 0 - 10 /hpf Final   Bacteria, UA Few None seen/Few Final    Labs: CBC: Recent Labs  Lab 06/22/22 0759 06/23/22 0606  WBC 11.7* 8.1  HGB 14.6 13.7  HCT 44.5 42.6  MCV 89.0 91.8  PLT 277 182   Basic Metabolic Panel: Recent Labs  Lab 06/22/22 0759 06/23/22 0606  NA 138 140  K 3.9 3.5  CL 105 108  CO2 24 25  GLUCOSE 132* 93  BUN 11 10  CREATININE 0.71 0.67  CALCIUM 9.6 8.6*   Liver Function Tests: Recent Labs  Lab 06/22/22 0759  AST 19  ALT 17  ALKPHOS 60  BILITOT 0.7  PROT 7.7  ALBUMIN 4.3   CBG: Recent Labs  Lab 06/23/22 0815 06/23/22 1209 06/23/22 1555 06/23/22 2028 06/24/22 0808  GLUCAP 82 124* 127* 95 105*    Discharge time spent: greater than 30 minutes.  Signed: Marcelino Duster, MD [facility] 06/24/2022

## 2022-06-24 NOTE — Progress Notes (Signed)
  RD consulted for nutrition education regarding diabetes.   Lab Results  Component Value Date   HGBA1C 6.9 (H) 04/24/2022  PTA DM medications 500 mg metformin BID.   Per consult order, pt is interested in learning more about DM diet and wishes to d/c metformin.   Attempted to speak with pt, however, in with MD at time of visit. Noted orders for discharge.   RD also ordered outpatient referral to [facility] for further education and reinforcement.   RD provided "Carbohydrate Counting for People with Diabetes" and "Plate Method" handouts from the Academy of Nutrition and Dietetics. Attached to AVS/ discharge summary.   Current diet order is soft, patient is consuming approximately n/a% of meals at this time. Labs and medications reviewed. No further nutrition interventions warranted at this time. RD contact information provided. If additional nutrition issues arise, please re-consult RD.  Levada Schilling, RD, LDN, CDCES Registered Dietitian II Certified Diabetes Care and Education Specialist Please refer to [facility] for RD and/or RD on-call/weekend/after hours pager

## 2022-06-24 NOTE — Evaluation (Signed)
Physical Therapy Evaluation Patient Details Name: Veronica Branch MRN: [id number] DOB: September 14, 1940 Today's Date: 06/24/2022  History of Present Illness  Pt is an 82 y/o F admitted on 06/22/22 after presenting with c/o abdominal pain, N&V, & diarrhea. CT of abdomen/pelvis showed dilated loops, enteritis, cholelithiasis. Surgery was consulted & recommends no surgical intervention. PMH: HTN, HLD, DM, hypothyroidism, obesity, diverticulitis, migraine, overactive bladder  Clinical Impression  Pt seen for PT evaluation with pt agreeable, husband present in room. Pt reports prior to admission she was independent without AD, working, living with her husband, still driving. On this date, pt is independent with bed mobility, transfers & gait without AD, negotiates 6 steps with 1 rail with mod I. At this time, pt does not demonstrate overt LOB & appears close to functional baseline. No need for acute PT services at this time. PT to complete current orders, please re-consult if new needs arise.       Recommendations for follow up therapy are one component of a multi-disciplinary discharge planning process, led by the attending physician.  Recommendations may be updated based on patient status, additional functional criteria and insurance authorization.  Follow Up Recommendations       Assistance Recommended at Discharge PRN  Patient can return home with the following  Assistance with cooking/housework    Equipment Recommendations None recommended by PT  Recommendations for Other Services       Functional Status Assessment Patient has had a recent decline in their functional status and demonstrates the ability to make significant improvements in function in a reasonable and predictable amount of time.     Precautions / Restrictions Precautions Precautions: None Restrictions Weight Bearing Restrictions: No      Mobility  Bed Mobility Overal bed mobility: Independent             General  bed mobility comments: supine>sit    Transfers Overall transfer level: Independent Equipment used: None               General transfer comment: STS    Ambulation/Gait Ambulation/Gait assistance: Independent Gait Distance (Feet): 220 Feet Assistive device: None Gait Pattern/deviations: WFL(Within Functional Limits) Gait velocity: slightly decreased        Stairs Stairs: Yes Stairs assistance: Modified independent (Device/Increase time) Stair Management: One rail Right, Step to pattern Number of Stairs: 6 (6") General stair comments: R ascending rail, R descending rail, step to pattern with pt leading with RLE when ascending & descending  Wheelchair Mobility    Modified Rankin (Stroke Patients Only)       Balance Overall balance assessment: Mild deficits observed, not formally tested                                           Pertinent Vitals/Pain Pain Assessment Pain Assessment: Faces Faces Pain Scale: Hurts a little bit Pain Location: abdominal bloating after eating Pain Descriptors / Indicators: Discomfort Pain Intervention(s): Monitored during session    Home Living Family/patient expects to be discharged to:: Private residence Living Arrangements: Spouse/significant other Available Help at Discharge: Family Type of Home: House Home Access: Stairs to enter Entrance Stairs-Rails: Right;Left (wideset) Entrance Stairs-Number of Steps: 4-5   Home Layout: Multi-level;Able to live on main level with bedroom/bathroom (3 level)        Prior Function Prior Level of Function : Independent/Modified Independent;Driving;Working/employed  Mobility Comments: Independent, driving, working. Notes chronic L knee OA.       Hand Dominance        Extremity/Trunk Assessment   Upper Extremity Assessment Upper Extremity Assessment: Overall WFL for tasks assessed    Lower Extremity Assessment Lower Extremity Assessment:  Overall WFL for tasks assessed (pt c/o chronic L knee OA)       Communication   Communication: No difficulties  Cognition Arousal/Alertness: Awake/alert Behavior During Therapy: WFL for tasks assessed/performed Overall Cognitive Status: Within Functional Limits for tasks assessed                                          General Comments General comments (skin integrity, edema, etc.): Pt inquiring about L knee OA with PT educating her on possibilty of OPPT (pt already speaking to PCP about this), use of ice, need to continue mobilizing to reduce stiffness with pt voicing understanding of education.    Exercises     Assessment/Plan    PT Assessment Patient does not need any further PT services  PT Problem List         PT Treatment Interventions      PT Goals (Current goals can be found in the Care Plan section)  Acute Rehab PT Goals Patient Stated Goal: get better, go home PT Goal Formulation: With patient Time For Goal Achievement: 07/08/22 Potential to Achieve Goals: Good    Frequency       Co-evaluation               AM-PAC PT "6 Clicks" Mobility  Outcome Measure Help needed turning from your back to your side while in a flat bed without using bedrails?: None Help needed moving from lying on your back to sitting on the side of a flat bed without using bedrails?: None Help needed moving to and from a bed to a chair (including a wheelchair)?: None Help needed standing up from a chair using your arms (e.g., wheelchair or bedside chair)?: None Help needed to walk in hospital room?: None Help needed climbing 3-5 steps with a railing? : None 6 Click Score: 24    End of Session   Activity Tolerance: Patient tolerated treatment well Patient left: in chair;with call bell/phone within reach;with family/visitor present Nurse Communication: Mobility status      Time: 1610-9604 PT Time Calculation (min) (ACUTE ONLY): 22 min   Charges:   PT  Evaluation $PT Eval Low Complexity: 1 Low          Aleda Grana, PT, DPT 06/24/22, 1:31 PM   Sandi Mariscal 06/24/2022, 1:30 PM

## 2022-06-25 ENCOUNTER — Telehealth: Payer: Self-pay | Admitting: *Deleted

## 2022-06-25 NOTE — Transitions of Care (Post Inpatient/ED Visit) (Signed)
06/25/2022  Name: Veronica Branch MRN: [id number] DOB: [birth year]  Today's TOC FU Call Status: Today's TOC FU Call Status:: Successful TOC FU Call Competed TOC FU Call Complete Date: 06/25/22  Transition Care Management Follow-up Telephone Call Date of Discharge: 06/24/22 Discharge Facility: [facility]) Type of Discharge: Inpatient Admission Primary Inpatient Discharge Diagnosis:: nausea and vomiting How have you been since you were released from the hospital?: Better Any questions or concerns?: No  Items Reviewed: Did you receive and understand the discharge instructions provided?: Yes Medications obtained,verified, and reconciled?: Partial Review Completed Reason for Partial Mediation Review: patient has not picked up medication yet. She will pick up the prilosec today. Any new allergies since your discharge?: No Dietary orders reviewed?: Yes Type of Diet Ordered:: Low sodium heart healthy diet Do you have support at home?: Yes People in Home: spouse Name of Support/Comfort Primary Source: Bill  Medications Reviewed Today: Medications Reviewed Today     Reviewed by Luella Cook, RN (Case Manager) on 06/25/22 at 1025  Med List Status: <None>   Medication Order Taking? Sig Documenting Provider Last Dose Status Informant  ACCU-CHEK GUIDE test strip [id number]  USE TO TEST BLOOD SUGAR ONCE DAILY Dale [place], MD  Active Self  blood glucose meter kit and supplies KIT [id number]  Dispense based on patient and insurance preference. Use to check sugars once daily. Dx E11.9 Dale [place], MD  Active Self    Discontinued 04/06/19 1338   ezetimibe (ZETIA) 10 MG tablet [id number]  Take 1 tablet (10 mg total) by mouth daily.  Patient not taking: Reported on 06/22/2022   Dale [place], MD  Active Self  Lancets [facility] ULTRASOFT) lancets [id number]  Use to test blood sugars 1-2 times daily   E11.9 Dale [place], MD  Active Self  levothyroxine  (SYNTHROID) 88 MCG tablet [id number] Yes Take 1 tablet (88 mcg total) by mouth daily before breakfast. Dale [place], MD Taking Active Self  metFORMIN (GLUCOPHAGE-XR) 500 MG 24 hr tablet [id number] Yes Take one tablet twice a day  Patient taking differently: Take 500 mg by mouth daily with breakfast. Take one tablet twice a day   Dale [place], MD Taking Active Self           Med Note Janeann Forehand Jun 22, 2022 12:51 PM) Patient reports one 500 mg tablet daily  mirabegron ER (MYRBETRIQ) 25 MG TB24 tablet [id number] Yes Take 1 tablet (25 mg total) by mouth daily. Carman Ching, PA-C Taking Active Self  nystatin cream (MYCOSTATIN) [id number] Yes Apply 1 Application topically 2 (two) times daily. Dale [place], MD Taking Active Self  omeprazole (PRILOSEC OTC) 20 MG tablet [id number]  Take 2 tablets (40 mg total) by mouth daily. Marcelino Duster, MD  Active            Med Note Luella Cook   Wed Jun 25, 2022 10:25 AM) Per patient will start [id number]  ondansetron (ZOFRAN) 4 MG tablet [id number] Yes Take 1 tablet (4 mg total) by mouth every 8 (eight) hours as needed for nausea or vomiting. Marcelino Duster, MD Taking Active   polyethylene glycol [facility] / GLYCOLAX) packet [id number] Yes Take 17 g by mouth daily. Mix one tablespoon with 8oz of your favorite juice or water every day until you are having soft formed stools. Then start taking once daily if you didn't have a stool the day before. Willy Eddy, MD Taking Active Self  propranolol (INDERAL) 40 MG tablet [id number] Yes  TAKE 1 TABLET BY MOUTH TWICE A Donne Anon, MD Taking Active Self           Med Note Janeann Forehand Jun 22, 2022 12:52 PM) Patient reports one 40 mg tablet daily  telmisartan (MICARDIS) 20 MG tablet [id number] Yes Take 1 tablet (20 mg total) by mouth daily. Dale [place], MD Taking Active Self  triamcinolone ointment (KENALOG) 0.1 % [id number] Yes Apply 1 Application  topically as needed. [provider] Taking Active Self            Home Care and Equipment/Supplies: Were Home Health Services Ordered?: NA Any new equipment or medical supplies ordered?: NA  Functional Questionnaire: Do you need assistance with bathing/showering or dressing?: No Do you need assistance with meal preparation?: No Do you need assistance with eating?: No Do you have difficulty maintaining continence: No Do you need assistance with getting out of bed/getting out of a chair/moving?: No Do you have difficulty managing or taking your medications?: No  Follow up appointments reviewed: PCP Follow-up appointment confirmed?: No MD Provider Line [phone] Given: Yes (Patient refused RN to make an earlier appointment and said she would do it.  Patient appt is orginally scheduled on [id number]) Specialist [facility] Follow-up appointment confirmed?: Yes Date of Specialist follow-up appointment?: 07/29/22 Follow-Up Specialty Provider:: Dr Claudine Mouton [phone] 1:30 Do you need transportation to your follow-up appointment?: No Do you understand care options if your condition(s) worsen?: Yes-patient verbalized understanding  SDOH Interventions Today    Flowsheet Row Most Recent Value  SDOH Interventions   Food Insecurity Interventions Intervention Not Indicated  Housing Interventions Intervention Not Indicated  Transportation Interventions Intervention Not Indicated, Patient Resources (Friends/Family)      Interventions Today    Flowsheet Row Most Recent Value  General Interventions   General Interventions Discussed/Reviewed General Interventions Discussed, General Interventions Reviewed, Doctor Visits  Doctor Visits Discussed/Reviewed Doctor Visits Discussed, Doctor Visits Reviewed  Nutrition Interventions   Nutrition Discussed/Reviewed Nutrition Discussed, Nutrition Reviewed  Pharmacy Interventions   Pharmacy Dicussed/Reviewed Pharmacy Topics Discussed,  Pharmacy Topics Reviewed      TOC Interventions Today    Flowsheet Row Most Recent Value  TOC Interventions   TOC Interventions Discussed/Reviewed TOC Interventions Discussed, TOC Interventions Reviewed        Gean Maidens BSN RN [facility] [phone]

## 2022-07-08 ENCOUNTER — Ambulatory Visit: Payer: Medicare PPO | Admitting: Urology

## 2022-07-08 ENCOUNTER — Encounter: Payer: Self-pay | Admitting: Urology

## 2022-07-08 VITALS — BP 109/66 | HR 59 | Wt 177.4 lb

## 2022-07-08 DIAGNOSIS — N3946 Mixed incontinence: Secondary | ICD-10-CM

## 2022-07-08 DIAGNOSIS — R3129 Other microscopic hematuria: Secondary | ICD-10-CM | POA: Diagnosis not present

## 2022-07-08 LAB — MICROSCOPIC EXAMINATION: Epithelial Cells (non renal): >10 /hpf — AB (ref 0–10)

## 2022-07-08 LAB — URINALYSIS, COMPLETE
Bilirubin, UA: NEGATIVE
Glucose, UA: NEGATIVE
Leukocytes,UA: NEGATIVE
Nitrite, UA: NEGATIVE
Specific Gravity, UA: >1.03 — ABNORMAL HIGH (ref 1.005–1.030)
Urobilinogen, Ur: 0.2 mg/dL (ref 0.2–1.0)
pH, UA: 5 (ref 5.0–7.5)

## 2022-07-08 MED ORDER — MIRABEGRON ER 50 MG PO TB24
50.0000 mg | ORAL_TABLET | Freq: Every day | ORAL | 11 refills | Status: DC
Start: 2022-07-08 — End: 2022-10-08

## 2022-07-08 NOTE — Progress Notes (Unsigned)
   07/08/22  CC:  Chief Complaint  Patient presents with   Cysto    HPI: 82 year old female who presents today for further evaluation of microscopic hematuria and urinary incontinence.  Notably, she was recently admitted with enteritis and ileus.  For this, she underwent CT abdomen pelvis with contrast on 06/22/2022 which showed normal kidneys without hydroureteronephrosis or any GU pathology.  Initially for incontinence, he started Dobson but at the time, was thought to have caused her nausea.  In retrospect, was likely the ileus.  She has been on Myrbetriq 25 mg since then.  Blood pressure 109/66, pulse (!) 59, weight 177 lb 7 oz (80.5 kg). NED. A&Ox3.   No respiratory distress   Abd soft, NT, ND Normal external genitalia with patent urethral meatus Pelvic exam reveals excellent vaginal vault support, no evidence of cystocele or pelvic organ prolapse  Cystoscopy Procedure Note  Patient identification was confirmed, informed consent was obtained, and patient was prepped using Betadine solution.  Lidocaine jelly was administered per urethral meatus.    Procedure: - Flexible cystoscope introduced, without any difficulty.   - Thorough search of the bladder revealed:    normal urethral meatus    normal urothelium with diffuse cystitis cystica    no stones    no ulcers     no tumors    no urethral polyps    no trabeculation  - Ureteral orifices were normal in position and appearance with trigonitis extending between and a long bladder neck  Post-Procedure: - Patient tolerated the procedure well  Assessment/ Plan:  1. Microscopic hematuria Cystoscopy today with evidence of cystitis cystica and trigonitis, otherwise no concern for malignancy - Urinalysis, Complete - mirabegron ER (MYRBETRIQ) 50 MG TB24 tablet; Take 1 tablet (50 mg total) by mouth daily.  Dispense: 30 tablet; Refill: 11  2. Urinary incontinence, mixed Some improvement with Myrbetriq 25 mg, will increase  dose to 50 mg for optimization - Urinalysis, Complete  F/u with the [facility] in 3 months to reassess urinary symptoms  Vanna Scotland, MD

## 2022-07-09 ENCOUNTER — Other Ambulatory Visit: Payer: Self-pay

## 2022-07-09 ENCOUNTER — Telehealth: Payer: Self-pay | Admitting: Internal Medicine

## 2022-07-09 MED ORDER — ACCU-CHEK GUIDE VI STRP: ORAL_STRIP | 3 refills | Status: AC

## 2022-07-09 NOTE — Telephone Encounter (Signed)
Medication refilled

## 2022-07-09 NOTE — Telephone Encounter (Signed)
Prescription Request  07/09/2022  LOV: 04/29/2022  What is the name of the medication or equipment? ACCU-CHEK GUIDE test strip  Have you contacted your pharmacy to request a refill? Yes   Which pharmacy would you like this sent to?   [facility] [address] [place] - [address] [name] [address] Phone: [phone] Fax: [phone]    Patient notified that their request is being sent to the clinical staff for review and that they should receive a response within 2 business days.   Please advise at Mobile There is no such number on file (mobile).

## 2022-07-14 ENCOUNTER — Telehealth: Payer: Self-pay | Admitting: Internal Medicine

## 2022-07-14 NOTE — Telephone Encounter (Signed)
Pt called to check in with Dr. Lorin Picket and let her know that she just tested positive for Covid. As per pt, she's been having a sore throat, cough and congestions. Pt stated she would like the same med sent over to her pharmacy as last time Dr. Lorin Picket sent her.?

## 2022-07-15 NOTE — Telephone Encounter (Signed)
Called patient to follow up. She is wanting to know more about paxlovid and if she should take an antiviral. Advised that she should keep appt scheduled to discuss with provider. Pt stated she will keep the appt. She is doing ok. Taking tylenol, eating and drinking fluids.

## 2022-07-15 NOTE — Telephone Encounter (Signed)
Patient has been scheduled to see Dr. Duncan Dull tomorrow (07/16/2022).

## 2022-07-16 ENCOUNTER — Encounter: Payer: Self-pay | Admitting: Internal Medicine

## 2022-07-16 ENCOUNTER — Ambulatory Visit: Payer: Medicare PPO | Admitting: Internal Medicine

## 2022-07-16 VITALS — BP 120/64 | HR 62 | Temp 98.0°F | Ht 60.0 in | Wt 174.6 lb

## 2022-07-16 DIAGNOSIS — U071 COVID-19: Secondary | ICD-10-CM | POA: Diagnosis not present

## 2022-07-16 MED ORDER — AZITHROMYCIN 250 MG PO TABS: ORAL_TABLET | ORAL | 0 refills | Status: AC

## 2022-07-16 NOTE — Patient Instructions (Signed)
You do not need the anti viral or an antibiotic at this time because your symptoms are mild and improving  Continue rest and hydration as able Treat your symptoms with over the counter medicines. If you begin the prescribed antibiotic, take as directed, even if your symptoms resolve, and take a probiotic for a minimum ot 3 weeks to prevent a serious antibiotic associated diarrhea called "c. Dif colitis." . Start the  antibiotic azithromycin if you develop fever,  sinus pain or brown/green sputum  and let us know so we can schedule a follow up   Here are a things that you can do at home to feel better:  Fevers and body aches:  ? Tylenol which is Acetaminophen (adult dose 1000 mg every 6 hours) OR ? Motrin or Advil which is Ibuprofen (adult dose 600 mg every 6 hours)  Stuffy nose: ? Decongestants: Sudafed (pseudoephedrine) or Sudafed PE (phenylephrine) is available on request from the pharmacist and may help with runny nose and to drain sinuses. Not good for people with high blood pressure or enlarged prostate. Also people with diabetes need to ask their physician before taking a decongestant. Take in the morning only, may cause insomnia if taken in late afternoon. ? Nasal sprays: decongestant sprays are good for short-term use; do not use longer than three days. An example is Afrin (oxymetazoline).  ? Saline nasal sprays or salt-water irrigations  are helpful and can be used long-term. [facility] or [facility] med's sinus rinse) ? Warm showers or inhaling water vapor from a hot bowl of water will help drain sinuses.   Cough: ? Cough suppressant: Dextromethorphan found in most cough preparations will help calm a cough, a teaspoon of honey can also be effective. ? Expectorant: Guaifenesin, which is found in products such as Robitussin and Mucinex, help thin mucus so that you can cough or spit the phlegm out. ? Combination cough and expectorant: Guaifenesin and Dextromethorphan are found in medications  like Robitussin DM (liquid) or Mucinex DM (tabs). ? Cough drops are comforting and may moisten the throat. ? Vaporizers and humidifiers help with irritation. ? People with high blood pressure can take these medications safely.  Sore throat: ? Salt water gargles: Combine a  teaspoon of salt in 8 oz. warm water and gargle. This will help clean the phlegm out of your throat. ? Throat sprays and drops: can help with pain.  REMEMBER TO REST AND DRINK LOTS OF WATER!

## 2022-07-16 NOTE — Assessment & Plan Note (Signed)
Symptoms are mild and improving since onset 4 days ago.  Reassurance provided,  symptomatic care outlined.given rx for azithromycin to use if seh develops fever  purulent sputum of if her sinus congestion progresses to sinus pain .

## 2022-07-16 NOTE — Progress Notes (Unsigned)
Subjective:  Patient ID: Veronica Branch, female    DOB: 03-28-1940  Age: 82 y.o. MRN: [id number]  CC: The encounter diagnosis was COVID-19 virus infection.   HPI Veronica Branch presents for  Chief Complaint  Patient presents with   Covid Positive    Started on Sunday with sore throat and congestion and now pt has had a low grade fever, chills, headache, indigestion, and loss of appetite. Pt tested positive on Monday.    82 yr old female with type 2 DM with hypertension presents with 4 day h/o pharyngitis, congestion,  chills headache and nausea/anorexia.  COVID test was positive on Day 2 of symptoms    Outpatient Medications Prior to Visit  Medication Sig Dispense Refill   blood glucose meter kit and supplies KIT Dispense based on patient and insurance preference. Use to check sugars once daily. Dx E11.9 1 each 0   ezetimibe (ZETIA) 10 MG tablet Take 1 tablet (10 mg total) by mouth daily. 90 tablet 1   glucose blood (ACCU-CHEK GUIDE) test strip USE TO TEST BLOOD SUGAR ONCE DAILY 100 strip 3   Lancets (ONETOUCH ULTRASOFT) lancets Use to test blood sugars 1-2 times daily   E11.9 100 each 12   levothyroxine (SYNTHROID) 88 MCG tablet Take 1 tablet (88 mcg total) by mouth daily before breakfast. 90 tablet 1   metFORMIN (GLUCOPHAGE-XR) 500 MG 24 hr tablet Take one tablet twice a day (Patient taking differently: Take 500 mg by mouth daily with breakfast. Take one tablet twice a day) 180 tablet 1   mirabegron ER (MYRBETRIQ) 50 MG TB24 tablet Take 1 tablet (50 mg total) by mouth daily. 30 tablet 11   nystatin cream (MYCOSTATIN) Apply 1 Application topically 2 (two) times daily. 60 g 0   omeprazole (PRILOSEC OTC) 20 MG tablet Take 2 tablets (40 mg total) by mouth daily. 30 tablet 1   ondansetron (ZOFRAN) 4 MG tablet Take 1 tablet (4 mg total) by mouth every 8 (eight) hours as needed for nausea or vomiting. 20 tablet 0   polyethylene glycol (MIRALAX / GLYCOLAX) packet Take 17 g by mouth  daily. Mix one tablespoon with 8oz of your favorite juice or water every day until you are having soft formed stools. Then start taking once daily if you didn't have a stool the day before. 30 each 0   propranolol (INDERAL) 40 MG tablet TAKE 1 TABLET BY MOUTH TWICE A DAY 180 tablet 0   telmisartan (MICARDIS) 20 MG tablet Take 1 tablet (20 mg total) by mouth daily. 90 tablet 3   triamcinolone ointment (KENALOG) 0.1 % Apply 1 Application topically as needed.     No facility-administered medications prior to visit.    Review of Systems;  Patient denies headache, fevers, malaise, unintentional weight loss, skin rash, eye pain, sinus congestion and sinus pain, sore throat, dysphagia,  hemoptysis , cough, dyspnea, wheezing, chest pain, palpitations, orthopnea, edema, abdominal pain, nausea, melena, diarrhea, constipation, flank pain, dysuria, hematuria, urinary  Frequency, nocturia, numbness, tingling, seizures,  Focal weakness, Loss of consciousness,  Tremor, insomnia, depression, anxiety, and suicidal ideation.      Objective:  BP 120/64   Pulse 62   Temp 98 F (36.7 C) (Oral)   Ht 5' (1.524 m)   Wt 174 lb 9.6 oz (79.2 kg)   SpO2 98%   BMI 34.10 kg/m   BP Readings from Last 3 Encounters:  07/16/22 120/64  07/08/22 109/66  06/24/22 131/73    Wt  Readings from Last 3 Encounters:  07/16/22 174 lb 9.6 oz (79.2 kg)  07/08/22 177 lb 7 oz (80.5 kg)  06/22/22 178 lb (80.7 kg)    Physical Exam Vitals reviewed.  Constitutional:      General: She is not in acute distress.    Appearance: Normal appearance. She is normal weight. She is not ill-appearing, toxic-appearing or diaphoretic.  HENT:     Head: Normocephalic.  Eyes:     General: No scleral icterus.       Right eye: No discharge.        Left eye: No discharge.     Conjunctiva/sclera: Conjunctivae normal.  Musculoskeletal:        General: Normal range of motion.  Skin:    General: Skin is warm and dry.  Neurological:      General: No focal deficit present.     Mental Status: She is alert and oriented to person, place, and time. Mental status is at baseline.  Psychiatric:        Mood and Affect: Mood normal.        Behavior: Behavior normal.        Thought Content: Thought content normal.        Judgment: Judgment normal.     Lab Results  Component Value Date   HGBA1C 6.9 (H) 04/24/2022   HGBA1C 7.0 (H) 01/21/2022   HGBA1C 6.9 (H) 09/10/2021    Lab Results  Component Value Date   CREATININE 0.67 06/23/2022   CREATININE 0.71 06/22/2022   CREATININE 0.71 04/24/2022    Lab Results  Component Value Date   WBC 8.1 06/23/2022   HGB 13.7 06/23/2022   HCT 42.6 06/23/2022   PLT 182 06/23/2022   GLUCOSE 93 06/23/2022   CHOL 187 04/24/2022   TRIG 118.0 04/24/2022   HDL 49.70 04/24/2022   LDLDIRECT 156.0 12/06/2012   LDLCALC 114 (H) 04/24/2022   ALT 17 06/22/2022   AST 19 06/22/2022   NA 140 06/23/2022   K 3.5 06/23/2022   CL 108 06/23/2022   CREATININE 0.67 06/23/2022   BUN 10 06/23/2022   CO2 25 06/23/2022   TSH 2.23 01/21/2022   INR 1.1 06/22/2022   HGBA1C 6.9 (H) 04/24/2022   MICROALBUR 1.2 05/02/2021    US Abdomen Limited RUQ (LIVER/GB)  Result Date: 06/23/2022 CLINICAL DATA:  Fatty food intolerance. EXAM: ULTRASOUND ABDOMEN LIMITED RIGHT UPPER QUADRANT COMPARISON:  CT abdomen pelvis 06/22/2022 FINDINGS: Gallbladder: Echogenic material layering in the gallbladder with posterior acoustic shadowing. Findings are compatible with small gallstones. No gallbladder wall thickening or pericholecystic fluid. Negative for sonographic Murphy sign. No significant gallbladder distension. Common bile duct: Diameter: 0.3 cm Liver: No focal lesion. Increased echogenicity in the liver. Poor visualization of the internal architecture. Findings are suggestive for steatosis. No intrahepatic biliary dilatation. Portal vein is patent on color Doppler imaging with normal direction of blood flow towards the liver.  Other: None. IMPRESSION: 1. Cholelithiasis. No evidence for cholecystitis. 2. Hepatic steatosis. Electronically Signed   By: Richarda Overlie M.D.   On: 06/23/2022 08:05   DG Abd 1 View  Result Date: 06/23/2022 CLINICAL DATA:  Abdominal pain EXAM: ABDOMEN - 1 VIEW COMPARISON:  06/22/2022 FINDINGS: Two supine frontal views of the abdomen and pelvis are obtained. Bowel gas pattern is unremarkable without evidence of obstruction or ileus. No masses or abnormal calcifications. No acute bony abnormalities. IMPRESSION: 1. Unremarkable bowel gas pattern. Please refer to recent abdominal CT describing mid jejunal wall thickening concerning  for enteritis. Electronically Signed   By: Sharlet Salina M.D.   On: 06/23/2022 02:45   CT ABDOMEN PELVIS W CONTRAST  Result Date: 06/22/2022 CLINICAL DATA:  82 year old female with history of abdominal pain. Suspected bowel obstruction. EXAM: CT ABDOMEN AND PELVIS WITH CONTRAST TECHNIQUE: Multidetector CT imaging of the abdomen and pelvis was performed using the standard protocol following bolus administration of intravenous contrast. RADIATION DOSE REDUCTION: This exam was performed according to the departmental dose-optimization program which includes automated exposure control, adjustment of the mA and/or kV according to patient size and/or use of iterative reconstruction technique. CONTRAST:  OMNIPAQUE IOHEXOL 300 MG/ML  SOLN COMPARISON:  CT of the abdomen and pelvis 06/15/2020. FINDINGS: Lower chest: Cardiomegaly. Hepatobiliary: No suspicious cystic or solid hepatic lesions. No intra or extrahepatic biliary ductal dilatation. Gallbladder is unremarkable in appearance. Pancreas: No pancreatic mass. No pancreatic ductal dilatation. No pancreatic or peripancreatic fluid collections or inflammatory changes. Spleen: Unremarkable. Adrenals/Urinary Tract: Bilateral kidneys and bilateral adrenal glands are normal in appearance. No hydroureteronephrosis. Urinary bladder is unremarkable  in appearance. Stomach/Bowel: The appearance of the stomach is normal. Multiple dilated loops of proximal and mid small bowel are noted, measuring up to 3.5 cm in diameter, with multiple air-fluid levels. There is progressive transition to nondilated small bowel loops in the region of the mid jejunum where there is substantial bowel wall thickening, best appreciated on axial image 39 of series 2. Distal aspect of the small bowel is otherwise relatively decompressed. There is some gas and stool throughout the colon and rectum. Numerous colonic diverticuli are noted, without surrounding inflammatory changes to suggest an acute diverticulitis at this time. Normal appendix. Vascular/Lymphatic: Atherosclerosis in the abdominal aorta and pelvic vasculature, without evidence of aneurysm or dissection. No lymphadenopathy noted in the abdomen or pelvis. Reproductive: Uterus and ovaries are unremarkable in appearance. Other: Small supraumbilical ventral hernia containing only omental fat. Small volume of ascites. No pneumoperitoneum. Musculoskeletal: There are no aggressive appearing lytic or blastic lesions noted in the visualized portions of the skeleton. IMPRESSION: 1. Although there are several mildly dilated loops of proximal and mid small bowel, there is mural thickening in the mid jejunum suggesting enteritis. Dilated loops of small bowel are favored to reflect an associated ileus, although partial small bowel obstruction is not entirely excluded. 2. Colonic diverticulosis without evidence of acute diverticulitis at this time. 3. Small volume of ascites. 4. Supraumbilical ventral hernia containing only omental fat. 5. Aortic atherosclerosis. 6. Cardiomegaly. 7. Additional incidental findings, as above. Electronically Signed   By: Trudie Reed M.D.   On: 06/22/2022 08:44    Assessment & Plan:  .COVID-19 virus infection Assessment & Plan: Symptoms are mild and improving since onset 4 days ago.  Reassurance  provided,  symptomatic care outlined.given rx for azithromycin to use if seh develops fever  purulent sputum of if her sinus congestion progresses to sinus pain .   Other orders -     Azithromycin; Take 2 tablets on day 1, then 1 tablet daily on days 2 through 5  Dispense: 6 tablet; Refill: 0     I provided 30 minutes of face-to-face time during this encounter reviewing patient's last visit with me, patient's  most recent visit with cardiology,  nephrology,  and neurology,  recent surgical and non surgical procedures, previous  labs and imaging studies, counseling on currently addressed issues,  and post visit ordering to diagnostics and therapeutics .   Follow-up: No follow-ups on file.   Mar Daring  Derrel Nip, MD

## 2022-07-29 ENCOUNTER — Inpatient Hospital Stay: Payer: Medicare PPO | Admitting: Surgery

## 2022-08-07 ENCOUNTER — Ambulatory Visit: Payer: Medicare PPO | Admitting: Surgery

## 2022-08-07 ENCOUNTER — Inpatient Hospital Stay: Payer: Medicare PPO | Admitting: Surgery

## 2022-08-07 ENCOUNTER — Encounter: Payer: Self-pay | Admitting: Surgery

## 2022-08-07 ENCOUNTER — Telehealth: Payer: Self-pay | Admitting: Internal Medicine

## 2022-08-07 VITALS — BP 127/60 | HR 68 | Temp 97.7°F | Ht 60.0 in | Wt 174.0 lb

## 2022-08-07 DIAGNOSIS — K8020 Calculus of gallbladder without cholecystitis without obstruction: Secondary | ICD-10-CM

## 2022-08-07 DIAGNOSIS — R198 Other specified symptoms and signs involving the digestive system and abdomen: Secondary | ICD-10-CM

## 2022-08-07 NOTE — Patient Instructions (Signed)
If you have any concerns or questions, please feel free to call our office.   Cholelithiasis  Cholelithiasis happens when gallstones form in the gallbladder. The gallbladder stores bile. Bile is a fluid that helps digest fats. Bile can harden and form into gallstones. If they cause a blockage, they can cause pain (gallbladder attack). What are the causes? This condition may be caused by: Too much bilirubin in the bile. This happens if you have sickle cell anemia. Too much of a fat-like substance (cholesterol) in your bile. Not enough bile salts in your bile. These salts help the body absorb and digest fats. The gallbladder not emptying fully or often enough. This is common in pregnant women. What increases the risk? The following factors may make you more likely to develop this condition: Being older than age 45. Eating a lot of fried foods, fat, and refined carbs (refined carbohydrates). Being female. Being pregnant many times. Using medicines with female hormones in them for a long time. Losing weight fast. Having gallstones in your family. Having health problems, such as diabetes, obesity, Crohn's disease, or liver disease. What are the signs or symptoms? Often, there may be gallstones but no symptoms. These gallstones are called silent gallstones. If a gallstone causes a blockage, you may get sudden pain. The pain: Can be in the upper right part of your belly (abdomen). Normally comes at night or after you eat. Can last an hour or more. Can spread to your right shoulder, back, or chest. Can feel like discomfort, burning, or fullness in the upper part of your belly (indigestion). If the blockage lasts more than a few hours, you can get an infection or swelling. You may: Vomit or feel like you may vomit (nauseous). Feel bloated. Have belly pain for 5 hours or more. Feel tender in your belly, often in the upper right part and under your ribs. Have a fever or chills. Have skin or  the white parts of your eyes turn yellow (jaundice). Have dark pee (urine) or pale poop (stool). How is this treated? Treatment for this condition depends on how bad you feel. If you have symptoms, you may need: Home care, if symptoms are not very bad. Do not eat for 12-24 hours. Drink only water and clear liquids. After 1 or 2 days, start to eat simple or clear foods. Try broth and crackers. You may need medicines for pain or stomach upset or both. If you have an infection, you will need antibiotics. A hospital stay, if you have very bad pain or a very bad infection. Surgery to remove your gallbladder. You may need this if: Gallstones keep coming back. You have very bad symptoms. Medicines to break up gallstones. Medicines may be used for 6-12 months. A procedure to find and take out gallstones or to break up gallstones. Follow these instructions at home: Medicines Take over-the-counter and prescription medicines only as told by your doctor. If you were prescribed antibiotics, take them as told by your doctor. Do not stop taking them even if you start to feel better. Ask your doctor if you should avoid driving or using machines while you are taking your medicine. Eating and drinking Drink enough fluid to keep your pee pale yellow. Drink water or clear fluids. This is important when you have pain. Eat healthy foods. Choose: Fewer fatty foods, such as fried foods. Fewer refined carbs. Avoid breads and grains that are highly processed, such as white bread and white rice. Choose whole grains, such as  whole-wheat bread and brown rice. More fiber. Almonds, fresh fruit, and beans are healthy sources. General instructions Keep a healthy weight. Keep all follow-up visits. You may need to see a specialist or a Careers adviser. Where to find more information [employer] of Diabetes and Digestive and Kidney Diseases: [email or web address] Contact a doctor if: You have sudden pain in the upper right part  of your belly. Pain might spread to your right shoulder, back, or chest. Your pain lasts more than 2 hours. You have been diagnosed with gallstones that have no symptoms and you get: Belly pain. Discomfort, burning, or fullness in the upper part of your abdomen. You keep feeling like you may vomit. You have dark pee or pale poop. Get help right away if: You have pain in your abdomen, that: Lasts more than 5 hours. Keeps getting worse. You have a fever or chills. You can't stop vomiting. Your skin or the white parts of your eyes turn yellow. This information is not intended to replace advice given to you by your health care provider. Make sure you discuss any questions you have with your health care provider. Document Revised: 10/21/2021 Document Reviewed: 10/21/2021 Elsevier Patient Education  2024 [employer].

## 2022-08-07 NOTE — Telephone Encounter (Signed)
Pt called in asking if Dr. Lorin Picket can put a referral for her for GI, preferably Dr. Smith Mince. Pt will like to call back regarding this.

## 2022-08-08 DIAGNOSIS — K8020 Calculus of gallbladder without cholecystitis without obstruction: Secondary | ICD-10-CM | POA: Insufficient documentation

## 2022-08-08 NOTE — Telephone Encounter (Signed)
Patients husband stated that she was seen by surgeon about her gall bladder and said that she would like to have a referral to GI (Dr Servando Snare) to be evaluated for her persistent stomach issues. She has been having diarrhea on and off for about a year> Husband stated that surgeon recommended keeping a food journal to see if they can determine what causes her flare ups. Ok to send referral to GI? Husband is aware you are out of the office.

## 2022-08-08 NOTE — Progress Notes (Signed)
Follow-up visit after this patient's identification of gallstones.  Her hospitalization she was admitted with ileus/gastroenteritis.  We discussed this diagnosis.  Primarily today she discusses changes in her bowel activity mostly related to her intermittent utilization of metformin. She denies significant fried food/fatty food intake, and does not relate significant right upper quadrant pain, or postprandial nausea or vomiting. She presents today with her husband.  BP 127/60   Pulse 68   Temp 97.7 F (36.5 C) (Oral)   Ht 5' (1.524 m)   Wt 174 lb (78.9 kg)   SpO2 93%   BMI 33.98 kg/m    Alert and oriented, no distress. No respiratory distress, well-perfused. On examination today her abdomen is soft and nontender with no appreciable right upper quadrant tenderness or epigastric tenderness whatsoever. Full range of motion all extremities. Neurologically nonfocal.  Assessment/plan, cholelithiasis without appreciable evidence of symptomatic cholelithiasis or biliary colic.  We discussed the role of proceeding with robotic cholecystectomy, but at present it seems as though she remains asymptomatic. Will be happy to see this pleasant lady back as needed.

## 2022-08-08 NOTE — Telephone Encounter (Signed)
LMTCB

## 2022-08-09 NOTE — Telephone Encounter (Signed)
Order placed for GI referral - to Dr Allen Norris.

## 2022-08-09 NOTE — Telephone Encounter (Signed)
I have placed the order for the referral. Notify - someone should be contacting her with an appt date and time.  Let us know if any problems.

## 2022-08-11 NOTE — Telephone Encounter (Signed)
Patient and husband are aware.

## 2022-08-18 ENCOUNTER — Other Ambulatory Visit: Payer: Self-pay | Admitting: Internal Medicine

## 2022-08-19 ENCOUNTER — Inpatient Hospital Stay: Payer: Medicare PPO | Admitting: Surgery

## 2022-08-20 ENCOUNTER — Telehealth: Payer: Self-pay

## 2022-08-20 NOTE — Telephone Encounter (Signed)
We received a referral from PCP for Change in bowel movement  and in referral it says for Dr. Servando Snare. Patient has seen Dr. Allegra Lai in the past. Called patient and patient states that she saw dr. Allegra Lai and went through a lot of testing and never got any answers for her symptoms that she was having. She states she was not satisfied in her care. She is wanting to transfer her care to Dr. Servando Snare. Please advise if you both are okay with this before I schedule the appointment

## 2022-08-21 NOTE — Telephone Encounter (Signed)
Called and left a message for call back  

## 2022-08-27 ENCOUNTER — Other Ambulatory Visit: Payer: Medicare PPO

## 2022-08-27 DIAGNOSIS — E1165 Type 2 diabetes mellitus with hyperglycemia: Secondary | ICD-10-CM | POA: Diagnosis not present

## 2022-08-27 DIAGNOSIS — I10 Essential (primary) hypertension: Secondary | ICD-10-CM | POA: Diagnosis not present

## 2022-08-27 DIAGNOSIS — E7800 Pure hypercholesterolemia, unspecified: Secondary | ICD-10-CM

## 2022-09-01 ENCOUNTER — Ambulatory Visit: Payer: Medicare PPO | Admitting: Internal Medicine

## 2022-09-01 ENCOUNTER — Encounter: Payer: Self-pay | Admitting: Internal Medicine

## 2022-09-01 VITALS — BP 122/70 | HR 60 | Temp 98.0°F | Resp 16 | Ht 60.0 in | Wt 174.0 lb

## 2022-09-01 DIAGNOSIS — G720 Drug-induced myopathy: Secondary | ICD-10-CM

## 2022-09-01 DIAGNOSIS — I10 Essential (primary) hypertension: Secondary | ICD-10-CM

## 2022-09-01 DIAGNOSIS — E7800 Pure hypercholesterolemia, unspecified: Secondary | ICD-10-CM

## 2022-09-01 DIAGNOSIS — I700 Atherosclerosis of aorta: Secondary | ICD-10-CM

## 2022-09-01 DIAGNOSIS — E119 Type 2 diabetes mellitus without complications: Secondary | ICD-10-CM | POA: Diagnosis not present

## 2022-09-01 DIAGNOSIS — N3946 Mixed incontinence: Secondary | ICD-10-CM

## 2022-09-01 DIAGNOSIS — K8020 Calculus of gallbladder without cholecystitis without obstruction: Secondary | ICD-10-CM | POA: Diagnosis not present

## 2022-09-01 DIAGNOSIS — T466X5A Adverse effect of antihyperlipidemic and antiarteriosclerotic drugs, initial encounter: Secondary | ICD-10-CM

## 2022-09-01 DIAGNOSIS — E039 Hypothyroidism, unspecified: Secondary | ICD-10-CM

## 2022-09-01 DIAGNOSIS — R3129 Other microscopic hematuria: Secondary | ICD-10-CM | POA: Diagnosis not present

## 2022-09-01 DIAGNOSIS — R197 Diarrhea, unspecified: Secondary | ICD-10-CM

## 2022-09-01 NOTE — Progress Notes (Signed)
Subjective:    Patient ID: Veronica Branch, female    DOB: 05-13-1940, 82 y.o.   MRN: [id number]  Patient here for  Chief Complaint  Patient presents with   Medical Management of Chronic Issues    HPI Here for follow up - hypercholesterolemia, hypertension and diabetes.  Was admitted 06/22/22 - 06/24/22 - after presenting with abdominal psin, vomiting and diarrhea. CT abdomen/ pelvis showed dilated loops, enteritis. Uls abdomen showed cholelithiasis. Admitted for further management with surgery consultation. Surgery team advised supportive care, no acute surgical intervention, outpatient follow up to determine need/interest for cholecystectomy. She did well with IV hydration, antiemetics and pain medications. Diarrhea, nausea improved. Discharged home. Saw surgery 08/07/22 (for f/u ) - cholelithiasis without symptomatic cholelithiasis or biliary colic. Elected to hold on surgery. She is watching her diet. Regarding her bowels, will have some days with diarrhea alternating with "normal".  Has f/u scheduled with Dr Servando Snare 09/2022. No chest pain.  Breathing stable. Saw urology 06/04/22 - f/u urinary incontinence.  Question of diarrhea on gemtesa.  Trial of myrbetriq. S/p cystoscopy 07/08/22 - evidence of cystitis cystica and trigonitis. Mammogram scheduled for tomorrow.    Past Medical History:  Diagnosis Date   Diabetes mellitus without complication (HCC)    Diverticulosis    GERD (gastroesophageal reflux disease)    Hiatal hernia    History of migraine headaches    Hypercholesterolemia    Hyperglycemia    Hypertension    Hypothyroidism    s/p removal of right thyroid lobe and isthmus (1992)   Past Surgical History:  Procedure Laterality Date   BREAST EXCISIONAL BIOPSY Right 2000   benign   THYROID LOBECTOMY  1992   s/p removal of right thyroid lobe and isthmus   Family History  Problem Relation Age of Onset   Coronary artery disease Mother        myocardial infarction and CHF   Breast  cancer Mother        62's   Breast cancer Maternal Aunt        x 2   Breast cancer Sister        67's   Breast cancer Cousin        maternal side   Breast cancer Other 28   Colon cancer Neg Hx    Social History   Socioeconomic History   Marital status: Married    Spouse name: Not on file   Number of children: 2   Years of education: Not on file   Highest education level: Not on file  Occupational History   Occupation: retired Runner, broadcasting/film/video  Tobacco Use   Smoking status: Never   Smokeless tobacco: Never  Vaping Use   Vaping status: Never Used  Substance and Sexual Activity   Alcohol use: No    Alcohol/week: 0.0 standard drinks of alcohol   Drug use: No   Sexual activity: Yes  Other Topics Concern   Not on file  Social History Narrative   Regularly exercises. Retired and married.    Social Determinants of Health   Financial Resource Strain: Not on file  Food Insecurity: No Food Insecurity (06/25/2022)   Hunger Vital Sign    Worried About Running Out of Food in the Last Year: Never true    Ran Out of Food in the Last Year: Never true  Transportation Needs: No Transportation Needs (06/25/2022)   PRAPARE - Administrator, Civil Service (Medical): No    Lack of Transportation (  Non-Medical): No  Physical Activity: Not on file  Stress: Not on file  Social Connections: Not on file     Review of Systems  Constitutional:  Negative for appetite change and unexpected weight change.  HENT:  Negative for congestion and sinus pressure.   Respiratory:  Negative for cough, chest tightness and shortness of breath.   Cardiovascular:  Negative for chest pain and palpitations.       No increased swelling.   Gastrointestinal:  Negative for abdominal pain and vomiting.  Genitourinary:  Negative for difficulty urinating and dysuria.  Musculoskeletal:  Negative for joint swelling and myalgias.  Skin:  Negative for color change and rash.  Neurological:  Negative for dizziness and  headaches.  Psychiatric/Behavioral:  Negative for agitation and dysphoric mood.        Objective:     BP 122/70   Pulse 60   Temp 98 F (36.7 C)   Resp 16   Ht 5' (1.524 m)   Wt 174 lb (78.9 kg)   SpO2 98%   BMI 33.98 kg/m  Wt Readings from Last 3 Encounters:  09/01/22 174 lb (78.9 kg)  08/07/22 174 lb (78.9 kg)  07/16/22 174 lb 9.6 oz (79.2 kg)    Physical Exam Vitals reviewed.  Constitutional:      General: She is not in acute distress.    Appearance: Normal appearance.  HENT:     Head: Normocephalic and atraumatic.     Right Ear: External ear normal.     Left Ear: External ear normal.  Eyes:     General: No scleral icterus.       Right eye: No discharge.        Left eye: No discharge.     Conjunctiva/sclera: Conjunctivae normal.  Neck:     Thyroid: No thyromegaly.  Cardiovascular:     Rate and Rhythm: Normal rate and regular rhythm.  Pulmonary:     Effort: No respiratory distress.     Breath sounds: Normal breath sounds. No wheezing.  Abdominal:     General: Bowel sounds are normal.     Palpations: Abdomen is soft.     Tenderness: There is no abdominal tenderness.  Musculoskeletal:        General: No swelling or tenderness.     Cervical back: Neck supple. No tenderness.  Lymphadenopathy:     Cervical: No cervical adenopathy.  Skin:    Findings: No erythema or rash.  Neurological:     Mental Status: She is alert.  Psychiatric:        Mood and Affect: Mood normal.        Behavior: Behavior normal.      Outpatient Encounter Medications as of 09/01/2022  Medication Sig   blood glucose meter kit and supplies KIT Dispense based on patient and insurance preference. Use to check sugars once daily. Dx E11.9   ezetimibe (ZETIA) 10 MG tablet Take 1 tablet (10 mg total) by mouth daily.   glucose blood (ACCU-CHEK GUIDE) test strip USE TO TEST BLOOD SUGAR ONCE DAILY   Lancets (ONETOUCH ULTRASOFT) lancets Use to test blood sugars 1-2 times daily   E11.9    levothyroxine (SYNTHROID) 88 MCG tablet Take 1 tablet (88 mcg total) by mouth daily before breakfast.   mirabegron ER (MYRBETRIQ) 50 MG TB24 tablet Take 1 tablet (50 mg total) by mouth daily.   nystatin cream (MYCOSTATIN) Apply 1 Application topically 2 (two) times daily.   omeprazole (PRILOSEC OTC) 20 MG tablet  Take 2 tablets (40 mg total) by mouth daily.   ondansetron (ZOFRAN) 4 MG tablet Take 1 tablet (4 mg total) by mouth every 8 (eight) hours as needed for nausea or vomiting.   polyethylene glycol (MIRALAX / GLYCOLAX) packet Take 17 g by mouth daily. Mix one tablespoon with 8oz of your favorite juice or water every day until you are having soft formed stools. Then start taking once daily if you didn't have a stool the day before.   propranolol (INDERAL) 40 MG tablet TAKE 1 TABLET BY MOUTH TWICE A DAY   telmisartan (MICARDIS) 20 MG tablet Take 1 tablet (20 mg total) by mouth daily.   triamcinolone ointment (KENALOG) 0.1 % Apply 1 Application topically as needed.   [DISCONTINUED] Blood Glucose Monitoring Suppl (ONE TOUCH ULTRA SYSTEM KIT) W/DEVICE KIT 1 kit by Does not apply route once.   [DISCONTINUED] metFORMIN (GLUCOPHAGE-XR) 500 MG 24 hr tablet Take one tablet twice a day (Patient taking differently: Take 500 mg by mouth daily with breakfast. Take one tablet twice a day)   No facility-administered encounter medications on file as of 09/01/2022.     Lab Results  Component Value Date   WBC 7.1 08/27/2022   HGB 12.9 08/27/2022   HCT 40.0 08/27/2022   PLT 240.0 08/27/2022   GLUCOSE 120 (H) 08/27/2022   CHOL 182 08/27/2022   TRIG 91.0 08/27/2022   HDL 50.00 08/27/2022   LDLDIRECT 156.0 12/06/2012   LDLCALC 114 (H) 08/27/2022   ALT 13 08/27/2022   AST 12 08/27/2022   NA 140 08/27/2022   K 4.0 08/27/2022   CL 105 08/27/2022   CREATININE 0.74 08/27/2022   BUN 11 08/27/2022   CO2 28 08/27/2022   TSH 2.23 01/21/2022   INR 1.1 06/22/2022   HGBA1C 6.7 (H) 08/27/2022   MICROALBUR <0.7  08/27/2022    US Abdomen Limited RUQ (LIVER/GB)  Result Date: 06/23/2022 CLINICAL DATA:  Fatty food intolerance. EXAM: ULTRASOUND ABDOMEN LIMITED RIGHT UPPER QUADRANT COMPARISON:  CT abdomen pelvis 06/22/2022 FINDINGS: Gallbladder: Echogenic material layering in the gallbladder with posterior acoustic shadowing. Findings are compatible with small gallstones. No gallbladder wall thickening or pericholecystic fluid. Negative for sonographic Murphy sign. No significant gallbladder distension. Common bile duct: Diameter: 0.3 cm Liver: No focal lesion. Increased echogenicity in the liver. Poor visualization of the internal architecture. Findings are suggestive for steatosis. No intrahepatic biliary dilatation. Portal vein is patent on color Doppler imaging with normal direction of blood flow towards the liver. Other: None. IMPRESSION: 1. Cholelithiasis. No evidence for cholecystitis. 2. Hepatic steatosis. Electronically Signed   By: Richarda Overlie M.D.   On: 06/23/2022 08:05   DG Abd 1 View  Result Date: 06/23/2022 CLINICAL DATA:  Abdominal pain EXAM: ABDOMEN - 1 VIEW COMPARISON:  06/22/2022 FINDINGS: Two supine frontal views of the abdomen and pelvis are obtained. Bowel gas pattern is unremarkable without evidence of obstruction or ileus. No masses or abnormal calcifications. No acute bony abnormalities. IMPRESSION: 1. Unremarkable bowel gas pattern. Please refer to recent abdominal CT describing mid jejunal wall thickening concerning for enteritis. Electronically Signed   By: Sharlet Salina M.D.   On: 06/23/2022 02:45   CT ABDOMEN PELVIS W CONTRAST  Result Date: 06/22/2022 CLINICAL DATA:  82 year old female with history of abdominal pain. Suspected bowel obstruction. EXAM: CT ABDOMEN AND PELVIS WITH CONTRAST TECHNIQUE: Multidetector CT imaging of the abdomen and pelvis was performed using the standard protocol following bolus administration of intravenous contrast. RADIATION DOSE REDUCTION: This exam  was  performed according to the departmental dose-optimization program which includes automated exposure control, adjustment of the mA and/or kV according to patient size and/or use of iterative reconstruction technique. CONTRAST:  OMNIPAQUE IOHEXOL 300 MG/ML  SOLN COMPARISON:  CT of the abdomen and pelvis 06/15/2020. FINDINGS: Lower chest: Cardiomegaly. Hepatobiliary: No suspicious cystic or solid hepatic lesions. No intra or extrahepatic biliary ductal dilatation. Gallbladder is unremarkable in appearance. Pancreas: No pancreatic mass. No pancreatic ductal dilatation. No pancreatic or peripancreatic fluid collections or inflammatory changes. Spleen: Unremarkable. Adrenals/Urinary Tract: Bilateral kidneys and bilateral adrenal glands are normal in appearance. No hydroureteronephrosis. Urinary bladder is unremarkable in appearance. Stomach/Bowel: The appearance of the stomach is normal. Multiple dilated loops of proximal and mid small bowel are noted, measuring up to 3.5 cm in diameter, with multiple air-fluid levels. There is progressive transition to nondilated small bowel loops in the region of the mid jejunum where there is substantial bowel wall thickening, best appreciated on axial image 39 of series 2. Distal aspect of the small bowel is otherwise relatively decompressed. There is some gas and stool throughout the colon and rectum. Numerous colonic diverticuli are noted, without surrounding inflammatory changes to suggest an acute diverticulitis at this time. Normal appendix. Vascular/Lymphatic: Atherosclerosis in the abdominal aorta and pelvic vasculature, without evidence of aneurysm or dissection. No lymphadenopathy noted in the abdomen or pelvis. Reproductive: Uterus and ovaries are unremarkable in appearance. Other: Small supraumbilical ventral hernia containing only omental fat. Small volume of ascites. No pneumoperitoneum. Musculoskeletal: There are no aggressive appearing lytic or blastic lesions  noted in the visualized portions of the skeleton. IMPRESSION: 1. Although there are several mildly dilated loops of proximal and mid small bowel, there is mural thickening in the mid jejunum suggesting enteritis. Dilated loops of small bowel are favored to reflect an associated ileus, although partial small bowel obstruction is not entirely excluded. 2. Colonic diverticulosis without evidence of acute diverticulitis at this time. 3. Small volume of ascites. 4. Supraumbilical ventral hernia containing only omental fat. 5. Aortic atherosclerosis. 6. Cardiomegaly. 7. Additional incidental findings, as above. Electronically Signed   By: Trudie Reed M.D.   On: 06/22/2022 08:44       Assessment & Plan:  Diabetes mellitus without complication (HCC) Assessment & Plan: Low carb diet and exercise.  Follow met b and A1c.   Lab Results  Component Value Date   HGBA1C 6.7 (H) 08/27/2022      Aortic atherosclerosis (HCC) Assessment & Plan: Had been on crestor. Discussed calculated cholesterol risk.  Startedzetia due to intolerance to statin medication.    Gallstones Assessment & Plan: Saw surgery 08/07/22 (for f/u ) - cholelithiasis without symptomatic cholelithiasis or biliary colic. Elected to hold on surgery.    Hypercholesterolemia Assessment & Plan: Intolerant to statin medication.  On zetia.  Low cholesterol diet and exercise.  Follow lipid panel.    Primary hypertension Assessment & Plan: Blood pressure as outlined.  On micardis.  Follow pressures.  Follow metabolic panel.    Acquired hypothyroidism Assessment & Plan: On thyroid replacement.  Follow tsh.    Microscopic hematuria Assessment & Plan: Urology 06/05/22 - plan cystoscopy.  Dr Apolinar Junes 06/2022 - cystoscopy - Cystoscopy today with evidence of cystitis cystica and trigonitis.  Myrbetriq   Mixed stress and urge urinary incontinence Assessment & Plan: Urology 06/05/22 - plan cystoscopy.  Dr Apolinar Junes 06/2022 - cystoscopy -  Cystoscopy today with evidence of cystitis cystica and trigonitis.  Myrbetriq   Statin myopathy Assessment &  Plan: Intolerant to statin medication.  On zetia.    Diarrhea, unspecified type Assessment & Plan: Still with diarrhea (intermittent) as outlined.  Will stop metformin.  Call with update regarding bowel issues.  Low carb diet.  Follow sugars.        Dale , MD

## 2022-09-01 NOTE — Patient Instructions (Signed)
Stop metformin 

## 2022-09-02 ENCOUNTER — Ambulatory Visit
Admission: RE | Admit: 2022-09-02 | Discharge: 2022-09-02 | Disposition: A | Discharge status: Home / Self Care | Payer: Medicare PPO | Source: Ambulatory Visit | Attending: Internal Medicine | Admitting: Internal Medicine

## 2022-09-02 DIAGNOSIS — Z1231 Encounter for screening mammogram for malignant neoplasm of breast: Secondary | ICD-10-CM | POA: Diagnosis not present

## 2022-09-07 ENCOUNTER — Encounter: Payer: Self-pay | Admitting: Internal Medicine

## 2022-09-07 DIAGNOSIS — R197 Diarrhea, unspecified: Secondary | ICD-10-CM | POA: Insufficient documentation

## 2022-09-07 NOTE — Assessment & Plan Note (Signed)
Still with diarrhea (intermittent) as outlined.  Will stop metformin.  Call with update regarding bowel issues.  Low carb diet.  Follow sugars.

## 2022-09-07 NOTE — Assessment & Plan Note (Signed)
Intolerant to statin medication.  On zetia. 

## 2022-09-07 NOTE — Assessment & Plan Note (Signed)
Blood pressure as outlined. On micardis.  Follow pressures.  Follow metabolic panel.  

## 2022-09-07 NOTE — Assessment & Plan Note (Signed)
On thyroid replacement.  Follow tsh.  

## 2022-09-07 NOTE — Assessment & Plan Note (Signed)
Intolerant to statin medication. On zetia.  Low cholesterol diet and exercise.  Follow lipid panel.  

## 2022-09-07 NOTE — Assessment & Plan Note (Signed)
Urology 06/05/22 - plan cystoscopy.  Dr Apolinar Junes 06/2022 - cystoscopy - Cystoscopy today with evidence of cystitis cystica and trigonitis.  Myrbetriq

## 2022-09-07 NOTE — Assessment & Plan Note (Signed)
Low carb diet and exercise.  Follow met b and A1c.   Lab Results  Component Value Date   HGBA1C 6.7 (H) 08/27/2022

## 2022-09-07 NOTE — Assessment & Plan Note (Signed)
Had been on crestor. Discussed calculated cholesterol risk.  Startedzetia due to intolerance to statin medication.

## 2022-09-07 NOTE — Assessment & Plan Note (Signed)
Saw surgery 08/07/22 (for f/u ) - cholelithiasis without symptomatic cholelithiasis or biliary colic. Elected to hold on surgery.

## 2022-10-08 ENCOUNTER — Encounter: Payer: Self-pay | Admitting: Physician Assistant

## 2022-10-08 ENCOUNTER — Ambulatory Visit: Payer: Medicare PPO | Admitting: Physician Assistant

## 2022-10-08 VITALS — BP 125/78 | HR 58 | Ht 60.0 in | Wt 170.0 lb

## 2022-10-08 DIAGNOSIS — B372 Candidiasis of skin and nail: Secondary | ICD-10-CM

## 2022-10-08 DIAGNOSIS — N3946 Mixed incontinence: Secondary | ICD-10-CM

## 2022-10-08 DIAGNOSIS — L439 Lichen planus, unspecified: Secondary | ICD-10-CM | POA: Diagnosis not present

## 2022-10-08 MED ORDER — TRIAMCINOLONE ACETONIDE 0.1 % EX OINT
1.0000 | TOPICAL_OINTMENT | CUTANEOUS | 5 refills | Status: DC | PRN
Start: 2022-10-08 — End: 2023-10-01

## 2022-10-08 MED ORDER — NYSTATIN 100000 UNIT/GM EX CREA
1.0000 | TOPICAL_CREAM | Freq: Two times a day (BID) | CUTANEOUS | 3 refills | Status: AC
Start: 2022-10-08 — End: ?

## 2022-10-08 MED ORDER — GEMTESA 75 MG PO TABS: 1.0000 | ORAL_TABLET | Freq: Once | ORAL | Status: AC

## 2022-10-08 MED ORDER — GEMTESA 75 MG PO TABS
75.0000 mg | ORAL_TABLET | Freq: Every day | ORAL | Status: DC
Start: 2022-10-08 — End: 2023-10-01

## 2022-10-08 NOTE — Progress Notes (Signed)
10/08/2022 2:00 PM   JALAYSIA FURLONG [birth year] [id number]  CC: Chief Complaint  Patient presents with   Follow-up    follow-up   HPI: Veronica Branch is a 82 y.o. female with PMH lichen planus with chronic vulvar burning, OAB wet with mixed incontinence, dry eye, and chronic diarrhea due to metformin who presents today for symptom recheck on Myrbetriq 50 mg.   Today she reports no improvement in her urinary symptoms on Myrbetriq.  She is still leaking considerably and using 3-4 heavy pads daily.  She has noticed an inguinal rash that she attributes to chronic moisture.  She thinks this is different from her lichen planus, but she asks for clarification today on where to apply antifungal versus steroid creams.  Notably, she has been taken off her metformin and her diarrhea has significantly improved.  She is interested in trying [place] again. PVR 4mL.  PMH: Past Medical History:  Diagnosis Date   Diabetes mellitus without complication (HCC)    Diverticulosis    GERD (gastroesophageal reflux disease)    Hiatal hernia    History of migraine headaches    Hypercholesterolemia    Hyperglycemia    Hypertension    Hypothyroidism    s/p removal of right thyroid lobe and isthmus (1992)    Surgical History: Past Surgical History:  Procedure Laterality Date   BREAST EXCISIONAL BIOPSY Right 2000   benign   THYROID LOBECTOMY  1992   s/p removal of right thyroid lobe and isthmus    Home Medications:  Allergies as of 10/08/2022       Reactions   Ciprofloxacin Hcl Other (See Comments)   tendonitis   Penicillins Hives   Urticaria   Sulfa Antibiotics Rash        Medication List        Accurate as of October 08, 2022  2:00 PM. If you have any questions, ask your nurse or doctor.          Accu-Chek Guide test strip Generic drug: glucose blood USE TO TEST BLOOD SUGAR ONCE DAILY   blood glucose meter kit and supplies Kit Dispense based on patient and  insurance preference. Use to check sugars once daily. Dx E11.9   ezetimibe 10 MG tablet Commonly known as: Zetia Take 1 tablet (10 mg total) by mouth daily.   levothyroxine 88 MCG tablet Commonly known as: SYNTHROID Take 1 tablet (88 mcg total) by mouth daily before breakfast.   mirabegron ER 50 MG Tb24 tablet Commonly known as: MYRBETRIQ Take 1 tablet (50 mg total) by mouth daily.   nystatin cream Commonly known as: MYCOSTATIN Apply 1 Application topically 2 (two) times daily.   omeprazole 20 MG tablet Commonly known as: PriLOSEC OTC Take 2 tablets (40 mg total) by mouth daily.   ondansetron 4 MG tablet Commonly known as: Zofran Take 1 tablet (4 mg total) by mouth every 8 (eight) hours as needed for nausea or vomiting.   onetouch ultrasoft lancets Use to test blood sugars 1-2 times daily   E11.9   polyethylene glycol 17 g packet Commonly known as: MIRALAX / GLYCOLAX Take 17 g by mouth daily. Mix one tablespoon with 8oz of your favorite juice or water every day until you are having soft formed stools. Then start taking once daily if you didn't have a stool the day before.   propranolol 40 MG tablet Commonly known as: INDERAL TAKE 1 TABLET BY MOUTH TWICE A DAY   telmisartan 20 MG  tablet Commonly known as: MICARDIS Take 1 tablet (20 mg total) by mouth daily.   triamcinolone ointment 0.1 % Commonly known as: KENALOG Apply 1 Application topically as needed.        Allergies:  Allergies  Allergen Reactions   Ciprofloxacin Hcl Other (See Comments)    tendonitis   Penicillins Hives    Urticaria    Sulfa Antibiotics Rash    Family History: Family History  Problem Relation Age of Onset   Coronary artery disease Mother        myocardial infarction and CHF   Breast cancer Mother        33's   Breast cancer Maternal Aunt        x 2   Breast cancer Sister        55's   Breast cancer Cousin        maternal side   Breast cancer Other 25   Colon cancer Neg Hx      Social History:   reports that she has never smoked. She has never been exposed to tobacco smoke. She has never used smokeless tobacco. She reports that she does not drink alcohol and does not use drugs.  Physical Exam: BP 125/78   Pulse (!) 58   Ht 5' (1.524 m)   Wt 170 lb (77.1 kg)   BMI 33.20 kg/m   Constitutional:  Alert and oriented, no acute distress, nontoxic appearing HEENT: [place], AT Cardiovascular: No clubbing, cyanosis, or edema Respiratory: Normal respiratory effort, no increased work of breathing GU: Lichen planus noted along the labia minora.  Candidal intertrigo of the left inguinal crease. Skin: No rashes, bruises or suspicious lesions Neurologic: Grossly intact, no focal deficits, moving all 4 extremities Psychiatric: Normal mood and affect  Assessment & Plan:   1. Urinary incontinence, mixed No significant symptomatic improvement on Myrbetriq, okay to reattempt Gemtesa especially now that her GI side effects from metformin have improved.  I gave her 6 weeks of samples today and we will see her back in clinic for symptom recheck and PVR.  We discussed consideration of third line therapies in the future including PTNS, intravesical Botox, and InterStim.  She is not particularly interested in pursuing any of these. - Vibegron (GEMTESA) 75 MG TABS; Take 1 tablet (75 mg total) by mouth daily.  2. Lichen planus We discussed applying triamcinolone along the labia minora at the site of her lichen planus.  She requests an ointment formulation that will be less easily wiped away, so I am filling this for her. - triamcinolone ointment (KENALOG) 0.1 %; Apply 1 Application topically as needed.  Dispense: 30 g; Refill: 5  3. Candidal intertrigo Candidal intertrigo noted on physical exam today in her left inguinal crease, likely secondary to chronic moisture.  We discussed applying nystatin cream along this rash twice daily for 1 to 2 weeks.  Once this rash clears up, we  discussed applying a thick diaper cream as a moisture barrier to protect her skin. - nystatin cream (MYCOSTATIN); Apply 1 Application topically 2 (two) times daily.  Dispense: 60 g; Refill: 3   Return in about 6 weeks (around 11/19/2022) for Symptom recheck with PVR.  Carman Ching, PA-C  [facility] [address], [address] [place], [place] [address] [phone]

## 2022-10-08 NOTE — Patient Instructions (Addendum)
Apply triamcinolone ointment around the clitoris to treat your lichen planus. Apply nystatin cream in your groin crease twice daily for 1-2 weeks to treat your yeast infection. After your yeast infection has cleared, start using a thick diaper cream as a moisture barrier to protect your skin. A picture of one of my favorites is below.

## 2022-10-14 ENCOUNTER — Encounter: Payer: Self-pay | Admitting: Gastroenterology

## 2022-10-14 ENCOUNTER — Ambulatory Visit: Payer: Medicare PPO | Admitting: Gastroenterology

## 2022-10-14 VITALS — BP 129/67 | HR 55 | Temp 97.7°F | Wt 177.0 lb

## 2022-10-14 DIAGNOSIS — K5900 Constipation, unspecified: Secondary | ICD-10-CM | POA: Diagnosis not present

## 2022-10-14 DIAGNOSIS — R197 Diarrhea, unspecified: Secondary | ICD-10-CM | POA: Diagnosis not present

## 2022-10-14 NOTE — Progress Notes (Signed)
Primary Care Physician: Dale Cadiz, MD  Primary Gastroenterologist:  Dr. Midge Minium  Chief Complaint  Patient presents with   Change in Bowel Habits    HPI: Veronica Branch is a 82 y.o. female here after seeing Dr. Allegra Lai in the past.  The patient had a colonoscopy by Dr. Bluford Kaufmann and prior to that had a colonoscopy by me.  The patient reports that she was told that she does not need any further colonoscopies.  The patient reports that she had significant amounts of diarrhea that was much improved after she stopped her metformin.  Now she reports that the loose stools are intermittent.  She did try a lactose free diet but that was when she was taking metformin and states that it did not help her symptoms.  The patient also states that she kept a food log but that was also done while she was on metformin.  She denies any unexplained weight loss fevers chills nausea vomiting black stools or bloody stools.  She does also endorse that when she goes out to dinner with her husband shortly after eating a meal she has to run to the bathroom.  She does report that she has intermittent constipation for which she takes MiraLAX.  The results of the MiraLAX is that her stools become soft within a few days and she has to stop the MiraLAX because she has what she describes as squirts.  The patient reports that she is told by her husband that it is likely due to the food she eats. The patient was recently in the hospital with enteritis with a CT scan showing signs of enteritis with symptoms of nausea vomiting and diarrhea.   Past Medical History:  Diagnosis Date   Diabetes mellitus without complication (HCC)    Diverticulosis    GERD (gastroesophageal reflux disease)    Hiatal hernia    History of migraine headaches    Hypercholesterolemia    Hyperglycemia    Hypertension    Hypothyroidism    s/p removal of right thyroid lobe and isthmus (1992)    Current Outpatient Medications  Medication Sig  Dispense Refill   blood glucose meter kit and supplies KIT Dispense based on patient and insurance preference. Use to check sugars once daily. Dx E11.9 1 each 0   ezetimibe (ZETIA) 10 MG tablet Take 1 tablet (10 mg total) by mouth daily. 90 tablet 1   glucose blood (ACCU-CHEK GUIDE) test strip USE TO TEST BLOOD SUGAR ONCE DAILY 100 strip 3   Lancets (ONETOUCH ULTRASOFT) lancets Use to test blood sugars 1-2 times daily   E11.9 100 each 12   levothyroxine (SYNTHROID) 88 MCG tablet Take 1 tablet (88 mcg total) by mouth daily before breakfast. 90 tablet 1   nystatin cream (MYCOSTATIN) Apply 1 Application topically 2 (two) times daily. 60 g 3   ondansetron (ZOFRAN) 4 MG tablet Take 1 tablet (4 mg total) by mouth every 8 (eight) hours as needed for nausea or vomiting. 20 tablet 0   polyethylene glycol (MIRALAX / GLYCOLAX) packet Take 17 g by mouth daily. Mix one tablespoon with 8oz of your favorite juice or water every day until you are having soft formed stools. Then start taking once daily if you didn't have a stool the day before. 30 each 0   propranolol (INDERAL) 40 MG tablet TAKE 1 TABLET BY MOUTH TWICE A DAY 180 tablet 0   telmisartan (MICARDIS) 20 MG tablet Take 1 tablet (20 mg total)  by mouth daily. 90 tablet 3   triamcinolone ointment (KENALOG) 0.1 % Apply 1 Application topically as needed. 30 g 5   Vibegron (GEMTESA) 75 MG TABS Take 1 tablet (75 mg total) by mouth daily.     omeprazole (PRILOSEC OTC) 20 MG tablet Take 2 tablets (40 mg total) by mouth daily. 30 tablet 1   No current facility-administered medications for this visit.    Allergies as of 10/14/2022 - Review Complete 10/14/2022  Allergen Reaction Noted   Ciprofloxacin hcl Other (See Comments) 11/25/2011   Penicillins Hives 11/25/2011   Sulfa antibiotics Rash 11/25/2011    ROS:  General: Negative for anorexia, weight loss, fever, chills, fatigue, weakness. ENT: Negative for hoarseness, difficulty swallowing , nasal  congestion. CV: Negative for chest pain, angina, palpitations, dyspnea on exertion, peripheral edema.  Respiratory: Negative for dyspnea at rest, dyspnea on exertion, cough, sputum, wheezing.  GI: See history of present illness. GU:  Negative for dysuria, hematuria, urinary incontinence, urinary frequency, nocturnal urination.  Endo: Negative for unusual weight change.    Physical Examination:   BP 129/67 (BP Location: Left Arm, Patient Position: Sitting, Cuff Size: Large)   Pulse (!) 55   Temp 97.7 F (36.5 C) (Oral)   Wt 177 lb (80.3 kg)   BMI 34.57 kg/m   General: Well-nourished, well-developed in no acute distress.  Eyes: No icterus. Conjunctivae pink. Extremities: No lower extremity edema. No clubbing or deformities. Neuro: Alert and oriented x 3.  Grossly intact. Skin: Warm and dry, no jaundice.   Psych: Alert and cooperative, normal mood and affect.  Labs:    Imaging Studies: No results found.  Assessment and Plan:   Veronica Branch is a 82 y.o. y/o female who comes in today with a history of diarrhea that improved greatly after stopping her metformin.  The patient now has intermittent diarrhea constipation with symptoms consistent with irritable bowel syndrome.  The patient has been told to increase fiber in her diet by taking a fiber supplement.  The patient has also been told to back off of the MiraLAX because it seems to result in her having diarrhea 2 days after starting the MiraLAX.  The patient has also been told that she can start a food diary to see if her symptoms can be contributed to any certain types of food.  She has also been instructed to try a lactose-free diet again since she is now off the metformin.  The patient does not need a repeat colonoscopy at this time since she does not have any worry symptoms.  The patient has been explained the plan and agrees with it.     Midge Minium, MD. Clementeen Graham    Note: This dictation was prepared with Dragon dictation  along with smaller phrase technology. Any transcriptional errors that result from this process are unintentional.

## 2022-11-19 ENCOUNTER — Ambulatory Visit: Payer: Medicare PPO | Admitting: Physician Assistant

## 2022-11-26 ENCOUNTER — Other Ambulatory Visit: Payer: Self-pay | Admitting: Internal Medicine

## 2023-02-10 ENCOUNTER — Telehealth: Payer: Self-pay | Admitting: Internal Medicine

## 2023-02-10 DIAGNOSIS — E7800 Pure hypercholesterolemia, unspecified: Secondary | ICD-10-CM

## 2023-02-10 DIAGNOSIS — I10 Essential (primary) hypertension: Secondary | ICD-10-CM

## 2023-02-10 DIAGNOSIS — E119 Type 2 diabetes mellitus without complications: Secondary | ICD-10-CM

## 2023-02-10 NOTE — Telephone Encounter (Signed)
Patient need lab orders.

## 2023-02-13 NOTE — Addendum Note (Signed)
Addended by: Rita Ohara D on: 02/13/2023 10:59 AM   Modules accepted: Orders

## 2023-02-13 NOTE — Telephone Encounter (Signed)
LABS ORDERED.

## 2023-02-19 ENCOUNTER — Other Ambulatory Visit: Payer: Medicare PPO

## 2023-02-19 DIAGNOSIS — E119 Type 2 diabetes mellitus without complications: Secondary | ICD-10-CM | POA: Diagnosis not present

## 2023-02-19 DIAGNOSIS — I10 Essential (primary) hypertension: Secondary | ICD-10-CM

## 2023-02-19 DIAGNOSIS — E7800 Pure hypercholesterolemia, unspecified: Secondary | ICD-10-CM | POA: Diagnosis not present

## 2023-02-19 LAB — BASIC METABOLIC PANEL
BUN: 11 mg/dL (ref 6–23)
CO2: 28 meq/L (ref 19–32)
Calcium: 8.9 mg/dL (ref 8.4–10.5)
Chloride: 106 meq/L (ref 96–112)
Creatinine, Ser: 0.72 mg/dL (ref 0.40–1.20)
GFR: 78.47 mL/min (ref >60.00)
Glucose, Bld: 131 mg/dL — ABNORMAL HIGH (ref 70–99)
Potassium: 3.6 meq/L (ref 3.5–5.1)
Sodium: 142 meq/L (ref 135–145)

## 2023-02-19 LAB — HEPATIC FUNCTION PANEL
ALT: 10 U/L (ref 0–35)
AST: 11 U/L (ref 0–37)
Albumin: 3.8 g/dL (ref 3.5–5.2)
Alkaline Phosphatase: 79 U/L (ref 39–117)
Bilirubin, Direct: 0.1 mg/dL (ref 0.0–0.3)
Total Bilirubin: 0.5 mg/dL (ref 0.2–1.2)
Total Protein: 6.6 g/dL (ref 6.0–8.3)

## 2023-02-19 LAB — LIPID PANEL
Cholesterol: 170 mg/dL (ref 0–200)
HDL: 51.8 mg/dL (ref >39.00)
LDL Cholesterol: 102 mg/dL — ABNORMAL HIGH (ref 0–99)
NonHDL: 118.09
Total CHOL/HDL Ratio: 3
Triglycerides: 79 mg/dL (ref 0.0–149.0)
VLDL: 15.8 mg/dL (ref 0.0–40.0)

## 2023-02-19 LAB — HEMOGLOBIN A1C: Hgb A1c MFr Bld: 7.7 % — ABNORMAL HIGH (ref 4.6–6.5)

## 2023-02-19 LAB — TSH: TSH: 4.44 u[IU]/mL (ref 0.35–5.50)

## 2023-02-23 ENCOUNTER — Ambulatory Visit: Payer: Medicare PPO | Admitting: Internal Medicine

## 2023-02-23 VITALS — BP 120/68 | HR 54 | Temp 97.4°F | Resp 16 | Ht 60.0 in | Wt 187.4 lb

## 2023-02-23 DIAGNOSIS — R3129 Other microscopic hematuria: Secondary | ICD-10-CM | POA: Diagnosis not present

## 2023-02-23 DIAGNOSIS — E7800 Pure hypercholesterolemia, unspecified: Secondary | ICD-10-CM

## 2023-02-23 DIAGNOSIS — E039 Hypothyroidism, unspecified: Secondary | ICD-10-CM

## 2023-02-23 DIAGNOSIS — I10 Essential (primary) hypertension: Secondary | ICD-10-CM

## 2023-02-23 DIAGNOSIS — K8020 Calculus of gallbladder without cholecystitis without obstruction: Secondary | ICD-10-CM

## 2023-02-23 DIAGNOSIS — E113393 Type 2 diabetes mellitus with moderate nonproliferative diabetic retinopathy without macular edema, bilateral: Secondary | ICD-10-CM | POA: Diagnosis not present

## 2023-02-23 DIAGNOSIS — G720 Drug-induced myopathy: Secondary | ICD-10-CM

## 2023-02-23 DIAGNOSIS — R32 Unspecified urinary incontinence: Secondary | ICD-10-CM | POA: Diagnosis not present

## 2023-02-23 DIAGNOSIS — T466X5A Adverse effect of antihyperlipidemic and antiarteriosclerotic drugs, initial encounter: Secondary | ICD-10-CM

## 2023-02-23 DIAGNOSIS — E1165 Type 2 diabetes mellitus with hyperglycemia: Secondary | ICD-10-CM | POA: Diagnosis not present

## 2023-02-23 DIAGNOSIS — Z23 Encounter for immunization: Secondary | ICD-10-CM | POA: Diagnosis not present

## 2023-02-23 DIAGNOSIS — I700 Atherosclerosis of aorta: Secondary | ICD-10-CM

## 2023-02-23 MED ORDER — SITAGLIPTIN PHOSPHATE 100 MG PO TABS: 100.0000 mg | ORAL_TABLET | Freq: Every day | ORAL | 3 refills | Status: DC

## 2023-02-23 NOTE — Progress Notes (Unsigned)
Subjective:    Patient ID: Veronica Branch, female    DOB: [birth year], 83 y.o.   MRN: [id number]  Patient here for  Chief Complaint  Patient presents with   Medical Management of Chronic Issues    HPI Here for a scheduled follow up - hypercholesterolemia, hypertension and diabetes.  Was admitted 06/22/22 - 06/24/22 - after presenting with abdominal pain, vomiting and diarrhea. CT abdomen/ pelvis showed dilated loops, enteritis. Uls abdomen showed cholelithiasis. Admitted for further management with surgery consultation. Surgery team advised supportive care, no acute surgical intervention, outpatient follow up to determine need/interest for cholecystectomy. Saw surgery 08/07/22 (for f/u ) - cholelithiasis without symptomatic cholelithiasis or biliary colic. Elected to hold on surgery.  Off metformin - felt diarrhea improved. Saw urology - trial of gemtesa. Saw Dr Servando Snare 10/14/22 - recommended to increase fiber and keep food diary.    Past Medical History:  Diagnosis Date   Diabetes mellitus without complication (HCC)    Diverticulosis    GERD (gastroesophageal reflux disease)    Hiatal hernia    History of migraine headaches    Hypercholesterolemia    Hyperglycemia    Hypertension    Hypothyroidism    s/p removal of right thyroid lobe and isthmus (1992)   Past Surgical History:  Procedure Laterality Date   BREAST EXCISIONAL BIOPSY Right 2000   benign   THYROID LOBECTOMY  1992   s/p removal of right thyroid lobe and isthmus   Family History  Problem Relation Age of Onset   Coronary artery disease Mother        myocardial infarction and CHF   Breast cancer Mother        16's   Breast cancer Maternal Aunt        x 2   Breast cancer Sister        60's   Breast cancer Cousin        maternal side   Breast cancer Other 79   Colon cancer Neg Hx    Social History   Socioeconomic History   Marital status: Married    Spouse name: Not on file   Number of children: 2   Years of  education: Not on file   Highest education level: Not on file  Occupational History   Occupation: retired Runner, broadcasting/film/video  Tobacco Use   Smoking status: Never    Passive exposure: Never   Smokeless tobacco: Never  Vaping Use   Vaping status: Never Used  Substance and Sexual Activity   Alcohol use: No    Alcohol/week: 0.0 standard drinks of alcohol   Drug use: No   Sexual activity: Yes  Other Topics Concern   Not on file  Social History Narrative   Regularly exercises. Retired and married.    Social Drivers of Corporate investment banker Strain: Not on file  Food Insecurity: No Food Insecurity (06/25/2022)   Hunger Vital Sign    Worried About Running Out of Food in the Last Year: Never true    Ran Out of Food in the Last Year: Never true  Transportation Needs: No Transportation Needs (06/25/2022)   PRAPARE - Administrator, Civil Service (Medical): No    Lack of Transportation (Non-Medical): No  Physical Activity: Not on file  Stress: Not on file  Social Connections: Not on file     Review of Systems     Objective:     BP 120/68   Pulse (!) 54  Temp (!) 97.4 F (36.3 C)   Resp 16   Ht 5' (1.524 m)   Wt 187 lb 6.4 oz (85 kg)   SpO2 98%   BMI 36.60 kg/m  Wt Readings from Last 3 Encounters:  02/23/23 187 lb 6.4 oz (85 kg)  10/14/22 177 lb (80.3 kg)  10/08/22 170 lb (77.1 kg)    Physical Exam  {Perform Simple Foot Exam  Perform Detailed exam:1} {Insert foot Exam (Optional):30965}   Outpatient Encounter Medications as of 02/23/2023  Medication Sig   blood glucose meter kit and supplies KIT Dispense based on patient and insurance preference. Use to check sugars once daily. Dx E11.9   ezetimibe (ZETIA) 10 MG tablet Take 1 tablet (10 mg total) by mouth daily.   glucose blood (ACCU-CHEK GUIDE) test strip USE TO TEST BLOOD SUGAR ONCE DAILY   Lancets (ONETOUCH ULTRASOFT) lancets Use to test blood sugars 1-2 times daily   E11.9   levothyroxine (SYNTHROID) 88  MCG tablet Take 1 tablet (88 mcg total) by mouth daily before breakfast.   nystatin cream (MYCOSTATIN) Apply 1 Application topically 2 (two) times daily.   omeprazole (PRILOSEC OTC) 20 MG tablet Take 2 tablets (40 mg total) by mouth daily.   ondansetron (ZOFRAN) 4 MG tablet Take 1 tablet (4 mg total) by mouth every 8 (eight) hours as needed for nausea or vomiting.   polyethylene glycol (MIRALAX / GLYCOLAX) packet Take 17 g by mouth daily. Mix one tablespoon with 8oz of your favorite juice or water every day until you are having soft formed stools. Then start taking once daily if you didn't have a stool the day before.   propranolol (INDERAL) 40 MG tablet TAKE 1 TABLET BY MOUTH TWICE A DAY   telmisartan (MICARDIS) 20 MG tablet Take 1 tablet (20 mg total) by mouth daily.   triamcinolone ointment (KENALOG) 0.1 % Apply 1 Application topically as needed.   Vibegron (GEMTESA) 75 MG TABS Take 1 tablet (75 mg total) by mouth daily.   [DISCONTINUED] Blood Glucose Monitoring Suppl (ONE TOUCH ULTRA SYSTEM KIT) W/DEVICE KIT 1 kit by Does not apply route once.   No facility-administered encounter medications on file as of 02/23/2023.     Lab Results  Component Value Date   WBC 7.1 08/27/2022   HGB 12.9 08/27/2022   HCT 40.0 08/27/2022   PLT 240.0 08/27/2022   GLUCOSE 131 (H) 02/19/2023   CHOL 170 02/19/2023   TRIG 79.0 02/19/2023   HDL 51.80 02/19/2023   LDLDIRECT 156.0 12/06/2012   LDLCALC 102 (H) 02/19/2023   ALT 10 02/19/2023   AST 11 02/19/2023   NA 142 02/19/2023   K 3.6 02/19/2023   CL 106 02/19/2023   CREATININE 0.72 02/19/2023   BUN 11 02/19/2023   CO2 28 02/19/2023   TSH 4.44 02/19/2023   INR 1.1 06/22/2022   HGBA1C 7.7 (H) 02/19/2023   MICROALBUR <0.7 08/27/2022    MM 3D SCREENING MAMMOGRAM BILATERAL BREAST Result Date: 09/04/2022 CLINICAL DATA:  Screening. EXAM: DIGITAL SCREENING BILATERAL MAMMOGRAM WITH TOMOSYNTHESIS AND CAD TECHNIQUE: Bilateral screening digital craniocaudal  and mediolateral oblique mammograms were obtained. Bilateral screening digital breast tomosynthesis was performed. The images were evaluated with computer-aided detection. COMPARISON:  Previous exam(s). ACR Breast Density Category b: There are scattered areas of fibroglandular density. FINDINGS: There are no findings suspicious for malignancy. IMPRESSION: No mammographic evidence of malignancy. A result letter of this screening mammogram will be mailed directly to the patient. RECOMMENDATION: Screening mammogram in one  year. (Code:[id number]-B-01Y) BI-RADS CATEGORY  1: Negative. Electronically Signed   By: Romona Curls M.D.   On: 09/04/2022 14:52       Assessment & Plan:  Encounter for administration of vaccine -     Flu Vaccine Trivalent High Dose (Fluad)     Dale Stafford Courthouse, MD

## 2023-02-28 ENCOUNTER — Encounter: Payer: Self-pay | Admitting: Internal Medicine

## 2023-02-28 NOTE — Assessment & Plan Note (Signed)
Intolerant to statin medication.  On zetia. 

## 2023-02-28 NOTE — Assessment & Plan Note (Signed)
Blood pressure as outlined. On micardis.  Follow pressures.  Follow metabolic panel.  

## 2023-02-28 NOTE — Assessment & Plan Note (Signed)
 Has seen urology. Previous trial of myrbetriq . Most recent visit. - trial of gemtesa .

## 2023-02-28 NOTE — Assessment & Plan Note (Signed)
 Elected to hold on surgery.  Off metformin  - felt diarrhea improved. Discussed recent labs and elevated A1c. Discussed treatment options including GLP 1 agonist and SGLT2 inhibitors. Declines injections. Concerned regarding her history of UTIs. Discussed other options, including sulfonylurea and januvia . Discussed the need to check blood sugars. Discussed CGM. After discussion, she elected to start januvia . Discussed the need for diet and exercise.

## 2023-02-28 NOTE — Assessment & Plan Note (Signed)
Had been on crestor. Discussed calculated cholesterol risk.  Startedzetia due to intolerance to statin medication.

## 2023-02-28 NOTE — Assessment & Plan Note (Signed)
 Dr Ace Holder 06/2022 - cystoscopy - Cystoscopy today with evidence of cystitis cystica and trigonitis

## 2023-02-28 NOTE — Assessment & Plan Note (Signed)
Intolerant to statin medication. On zetia.  Low cholesterol diet and exercise.  Follow lipid panel.  

## 2023-02-28 NOTE — Assessment & Plan Note (Signed)
 On thyroid replacement.  Follow tsh.

## 2023-02-28 NOTE — Assessment & Plan Note (Signed)
Saw surgery 08/07/22 (for f/u ) - cholelithiasis without symptomatic cholelithiasis or biliary colic. Elected to hold on surgery.

## 2023-04-02 ENCOUNTER — Other Ambulatory Visit: Payer: Self-pay | Admitting: Internal Medicine

## 2023-05-09 ENCOUNTER — Emergency Department

## 2023-05-09 ENCOUNTER — Emergency Department
Admission: EM | Admit: 2023-05-09 | Discharge: 2023-05-09 | Disposition: A | Discharge status: Home / Self Care | Attending: Emergency Medicine | Admitting: Emergency Medicine

## 2023-05-09 ENCOUNTER — Other Ambulatory Visit: Payer: Self-pay

## 2023-05-09 DIAGNOSIS — K5792 Diverticulitis of intestine, part unspecified, without perforation or abscess without bleeding: Secondary | ICD-10-CM | POA: Diagnosis not present

## 2023-05-09 DIAGNOSIS — K5730 Diverticulosis of large intestine without perforation or abscess without bleeding: Secondary | ICD-10-CM | POA: Diagnosis not present

## 2023-05-09 DIAGNOSIS — R1032 Left lower quadrant pain: Secondary | ICD-10-CM | POA: Insufficient documentation

## 2023-05-09 LAB — COMPREHENSIVE METABOLIC PANEL WITH GFR
ALT: 17 U/L (ref 0–44)
AST: 17 U/L (ref 15–41)
Albumin: 4.2 g/dL (ref 3.5–5.0)
Alkaline Phosphatase: 66 U/L (ref 38–126)
Anion gap: 13 (ref 5–15)
BUN: 12 mg/dL (ref 8–23)
CO2: 23 mmol/L (ref 22–32)
Calcium: 9.5 mg/dL (ref 8.9–10.3)
Chloride: 103 mmol/L (ref 98–111)
Creatinine, Ser: 0.66 mg/dL (ref 0.44–1.00)
GFR, Estimated: >60 mL/min (ref >60)
Glucose, Bld: 102 mg/dL — ABNORMAL HIGH (ref 70–99)
Potassium: 3.8 mmol/L (ref 3.5–5.1)
Sodium: 139 mmol/L (ref 135–145)
Total Bilirubin: 1 mg/dL (ref 0.0–1.2)
Total Protein: 7.7 g/dL (ref 6.5–8.1)

## 2023-05-09 LAB — CBC WITH DIFFERENTIAL/PLATELET
Abs Immature Granulocytes: 0.04 10*3/uL (ref 0.00–0.07)
Basophils Absolute: 0.1 10*3/uL (ref 0.0–0.1)
Basophils Relative: 1 %
Eosinophils Absolute: 0.2 10*3/uL (ref 0.0–0.5)
Eosinophils Relative: 2 %
HCT: 41.2 % (ref 36.0–46.0)
Hemoglobin: 13.7 g/dL (ref 12.0–15.0)
Immature Granulocytes: 0 %
Lymphocytes Relative: 23 %
Lymphs Abs: 2.1 10*3/uL (ref 0.7–4.0)
MCH: 28.5 pg (ref 26.0–34.0)
MCHC: 33.3 g/dL (ref 30.0–36.0)
MCV: 85.7 fL (ref 80.0–100.0)
Monocytes Absolute: 0.6 10*3/uL (ref 0.1–1.0)
Monocytes Relative: 7 %
Neutro Abs: 6.1 10*3/uL (ref 1.7–7.7)
Neutrophils Relative %: 67 %
Platelets: 273 10*3/uL (ref 150–400)
RBC: 4.81 MIL/uL (ref 3.87–5.11)
RDW: 13.1 % (ref 11.5–15.5)
WBC: 9.1 10*3/uL (ref 4.0–10.5)
nRBC: 0 % (ref 0.0–0.2)

## 2023-05-09 LAB — URINALYSIS, W/ REFLEX TO CULTURE (INFECTION SUSPECTED)
Bilirubin Urine: NEGATIVE
Glucose, UA: NEGATIVE mg/dL
Ketones, ur: NEGATIVE mg/dL
Leukocytes,Ua: NEGATIVE
Nitrite: NEGATIVE
Protein, ur: NEGATIVE mg/dL
Specific Gravity, Urine: 1.003 — ABNORMAL LOW (ref 1.005–1.030)
pH: 7 (ref 5.0–8.0)

## 2023-05-09 LAB — LIPASE, BLOOD: Lipase: 32 U/L (ref 11–51)

## 2023-05-09 MED ORDER — ONDANSETRON HCL 4 MG/2ML IJ SOLN
4.0000 mg | Freq: Once | INTRAMUSCULAR | Status: AC
  Administered 2023-05-09: 4 mg via INTRAVENOUS
  Filled 2023-05-09: qty 2

## 2023-05-09 MED ORDER — MORPHINE SULFATE (PF) 4 MG/ML IV SOLN
4.0000 mg | Freq: Once | INTRAVENOUS | Status: AC
  Administered 2023-05-09: 4 mg via INTRAVENOUS
  Filled 2023-05-09: qty 1

## 2023-05-09 MED ORDER — IOHEXOL 300 MG/ML  SOLN
100.0000 mL | Freq: Once | INTRAMUSCULAR | Status: AC | PRN
  Administered 2023-05-09: 100 mL via INTRAVENOUS

## 2023-05-09 MED ORDER — ONDANSETRON 4 MG PO TBDP
4.0000 mg | ORAL_TABLET | Freq: Once | ORAL | Status: AC
  Administered 2023-05-09: 4 mg via ORAL
  Filled 2023-05-09: qty 1

## 2023-05-09 MED ORDER — HYDROCODONE-ACETAMINOPHEN 5-325 MG PO TABS
1.0000 | ORAL_TABLET | Freq: Once | ORAL | Status: AC
  Administered 2023-05-09: 1 via ORAL
  Filled 2023-05-09: qty 1

## 2023-05-09 MED ORDER — HYDROCODONE-ACETAMINOPHEN 5-325 MG PO TABS: 1.0000 | ORAL_TABLET | Freq: Four times a day (QID) | ORAL | 0 refills | Status: AC | PRN

## 2023-05-09 MED ORDER — ONDANSETRON HCL 4 MG PO TABS: 4.0000 mg | ORAL_TABLET | Freq: Four times a day (QID) | ORAL | 0 refills | Status: AC | PRN

## 2023-05-09 MED ORDER — SODIUM CHLORIDE 0.9 % IV BOLUS
1000.0000 mL | Freq: Once | INTRAVENOUS | Status: AC
  Administered 2023-05-09: 1000 mL via INTRAVENOUS

## 2023-05-09 NOTE — Discharge Instructions (Signed)
 You are seen in the ER today for abdominal pain.  Your testing fortunately did not show an emergency cause for this.  Your pain may be related to a viral illness or muscle strain.  You can take Tylenol  for your pain.  For breakthrough pain I have sent a short course of narcotic pain medicine to your pharmacy.  Do not take more than 4 g of acetaminophen  a day.  The narcotic medicine can make you drowsy, do not drive or operate machinery when taking this.  You can try over-the-counter medicine to help with your diarrhea.  Return to the ER for new or worsening symptoms.

## 2023-05-09 NOTE — ED Triage Notes (Signed)
 Pt to ED via POV; c/o lower back pain and diarrhea that started on Thursday. Pt states she has constipation and diarrhea; last BM was yesterday evening at around 4:30pm. States that if she could BM today, her back would feel better.

## 2023-05-09 NOTE — ED Notes (Signed)
Purwick placed on pt

## 2023-05-09 NOTE — ED Provider Notes (Signed)
 [facility] Provider Note    Event Date/Time   First MD Initiated Contact with Patient 05/09/23 1229     (approximate)   History   Back Pain   HPI  Veronica Branch is a 83 year old female presenting to the emergency department for evaluation of abdominal pain.  On Thursday, patient had onset of left-sided pain in her left lower abdomen radiating to her back.  Has had episodes of diarrhea as well as straining.  No vomiting.  Has a history of diverticulitis and reports this feels similar.  No numbness, tingling, focal weakness.  No fevers.  No trauma.     Physical Exam   Triage Vital Signs: ED Triage Vitals  Encounter Vitals Group     BP 05/09/23 1210 (!) 154/96     Systolic BP Percentile --      Diastolic BP Percentile --      Pulse Rate 05/09/23 1210 62     Resp 05/09/23 1210 17     Temp 05/09/23 1215 (!) 97.5 F (36.4 C)     Temp Source 05/09/23 1210 Oral     SpO2 05/09/23 1210 95 %     Weight 05/09/23 1211 165 lb (74.8 kg)     Height 05/09/23 1211 5' (1.524 m)     Head Circumference --      Peak Flow --      Pain Score 05/09/23 1211 9     Pain Loc --      Pain Education --      Exclude from Growth Chart --     Most recent vital signs: Vitals:   05/09/23 1215 05/09/23 1637  BP:  (!) 163/58  Pulse:  (!) 55  Resp:  16  Temp: (!) 97.5 F (36.4 C) 97.9 F (36.6 C)  SpO2:  97%     General: Awake, interactive  CV:  Regular rate, good peripheral perfusion.  Resp:  Unlabored respirations Abd:  Nondistended, soft, tender to palpation most notably in the left lower quadrant, remainder of abdomen with mild tenderness Neuro:  Symmetric facial movement, fluid speech   ED Results / Procedures / Treatments   Labs (all labs ordered are listed, but only abnormal results are displayed) Labs Reviewed  COMPREHENSIVE METABOLIC PANEL WITH GFR - Abnormal; Notable for the following components:      Result Value   Glucose, Bld 102 (*)    All  other components within normal limits  URINALYSIS, W/ REFLEX TO CULTURE (INFECTION SUSPECTED) - Abnormal; Notable for the following components:   Color, Urine STRAW (*)    APPearance CLEAR (*)    Specific Gravity, Urine 1.003 (*)    Hgb urine dipstick MODERATE (*)    Bacteria, UA RARE (*)    All other components within normal limits  CBC WITH DIFFERENTIAL/PLATELET  LIPASE, BLOOD     EKG EKG independently reviewed interpreted by myself (ER attending) demonstrates:    RADIOLOGY Imaging independently reviewed and interpreted by myself demonstrates:  CT abdomen pelvis without acute findings  Formal Radiology Read:  CT ABDOMEN PELVIS W CONTRAST Result Date: 05/09/2023 CLINICAL DATA:  LLQ abdominal pain hx diverticulitis EXAM: CT ABDOMEN AND PELVIS WITH CONTRAST TECHNIQUE: Multidetector CT imaging of the abdomen and pelvis was performed using the standard protocol following bolus administration of intravenous contrast. RADIATION DOSE REDUCTION: This exam was performed according to the departmental dose-optimization program which includes automated exposure control, adjustment of the mA and/or kV according to patient size and/or  use of iterative reconstruction technique. CONTRAST:  OMNIPAQUE  IOHEXOL  300 MG/ML  SOLN COMPARISON:  06/22/2022 FINDINGS: Lower chest: No pleural or pericardial effusion. Minimal linear scarring or atelectasis in the lung bases left greater than right. Hepatobiliary: No focal liver abnormality is seen. No gallstones, gallbladder wall thickening, or biliary dilatation. Pancreas: Unremarkable. No pancreatic ductal dilatation or surrounding inflammatory changes. Spleen: Normal in size without focal abnormality. Adrenals/Urinary Tract: Adrenal glands are unremarkable. Kidneys are normal, without renal calculi, focal lesion, or hydronephrosis. Bladder is unremarkable. Stomach/Bowel: Stomach is nondistended. Small bowel decompressed. Normal appendix. The colon is partially  distended, with a few scattered scattered sigmoid diverticula; no adjacent inflammatory change. Vascular/Lymphatic: Minimal calcified aortic plaque without aneurysm. Portal vein patent. No abdominal or pelvic adenopathy. Reproductive: Uterus and bilateral adnexa are unremarkable. Other: Bilateral pelvic phleboliths.  No ascites.  No free air. Musculoskeletal: Small paraumbilical hernia containing only mesenteric fat. Regional bones unremarkable. IMPRESSION: 1. No acute findings. 2. Sigmoid diverticulosis. 3. Small paraumbilical hernia containing only mesenteric fat. 4.  Aortic Atherosclerosis (ICD10-I70.0). Electronically Signed   By: Nicoletta Barrier M.D.   On: 05/09/2023 17:58    PROCEDURES:  Critical Care performed: No  Procedures   MEDICATIONS ORDERED IN ED: Medications  HYDROcodone -acetaminophen  (NORCO/VICODIN) 5-325 MG per tablet 1 tablet (has no administration in time range)  ondansetron  (ZOFRAN -ODT) disintegrating tablet 4 mg (has no administration in time range)  sodium chloride  0.9 % bolus 1,000 mL (0 mLs Intravenous Stopped 05/09/23 1638)  ondansetron  (ZOFRAN ) injection 4 mg (4 mg Intravenous Given 05/09/23 1343)  morphine  (PF) 4 MG/ML injection 4 mg (4 mg Intravenous Given 05/09/23 1343)  iohexol  (OMNIPAQUE ) 300 MG/ML solution 100 mL (100 mLs Intravenous Contrast Given 05/09/23 1457)     IMPRESSION / MDM / ASSESSMENT AND PLAN / ED COURSE  I reviewed the triage vital signs and the nursing notes.  Differential diagnosis includes, but is not limited to, diverticulitis, colitis, other acute intra-abdominal process, UTI  Patient's presentation is most consistent with acute presentation with potential threat to life or bodily function.  83 year old female presenting with abdominal pain.  Stable vitals on presentation.  Will obtain labs, CT to further evaluate.  IV fluids, morphine , Zofran  ordered for pain control.  6:33 PM CT without acute findings.  Periumbilical hernia noted, reducible  on exam.  Patient reevaluated.  She reports improvement in her symptoms.  She is comfortable with discharge home.  Will DC with short course of pain and nausea medication.  Strict return precautions provided.  Patient discharged stable condition.      FINAL CLINICAL IMPRESSION(S) / ED DIAGNOSES   Final diagnoses:  LLQ abdominal pain     Rx / DC Orders   ED Discharge Orders          Ordered    HYDROcodone -acetaminophen  (NORCO/VICODIN) 5-325 MG tablet  Every 6 hours PRN        05/09/23 1832    ondansetron  (ZOFRAN ) 4 MG tablet  Every 6 hours PRN        05/09/23 1832             Note:  This document was prepared using Dragon voice recognition software and may include unintentional dictation errors.   Claria Crofts, MD 05/09/23 [phone]

## 2023-05-11 ENCOUNTER — Ambulatory Visit: Payer: Self-pay | Admitting: *Deleted

## 2023-05-11 NOTE — Telephone Encounter (Signed)
 Patient complaining of lower abd pain, bloating and constipation. Has had ED work up- scans and labs normal.. Last bowel movement was Friday. She had diarrhea all of last week and then became constipated Friday. She stopped her benefiber Wednesday because she thought that may be causing the diarrhea. She has not tried suppository or enema. She is wanting to know what she can try to have a bowel movement.

## 2023-05-11 NOTE — Telephone Encounter (Signed)
Patient is aware of below.

## 2023-05-11 NOTE — Telephone Encounter (Signed)
 Ok to try an enema or suppository to get bowels moving. Once moving, needs to take something to keep them moving. Benefiber should not cause the diarrhea. Let us  know if persistent problems.

## 2023-05-11 NOTE — Telephone Encounter (Signed)
 Chief Complaint: constipation since ED 05/09/23. Per pt husband on DPR. Severe back pain limited mobility. Requires assistance for all mobility  Symptoms: severe back pain better with taking hydrocodone  but now constipated since Friday. Nausea no vomiting. Abdominal pain . Has taken stool softeners, miralax  in coffee this am.  Frequency: since Friday  Pertinent Negatives: Patient denies chest pain no difficulty breathing . Disposition: [] ED /[] Urgent Care (no appt availability in office) / [] Appointment(In office/virtual)/ []  [place] [facility]/ [] Home Care/ [] Refused Recommended Disposition /[] [place] [facility]/ [x]  Follow-up with PCP Additional Notes:    Please advise regarding constipation.  Pt husband requesting call back for recommended medications to try for constipation . Patient has difficulty with mobility and pain and requesting to keep OV for hospital f/u 05/19/23.  Please advise .       Copied from CRM [id number]. Topic: Clinical - Red Word Triage >> May 11, 2023 12:01 PM Abigail D wrote: Red Word that prompted transfer to Nurse Triage: Extreme back and stomach pain, barely able to get up out of chair. Struggles to walk without assistance. Was in ED on 4/19. Reason for Disposition  Last bowel movement (BM) > 4 days ago    3 days ago  Answer Assessment - Initial Assessment Questions 1. STOOL PATTERN OR FREQUENCY: "How often do you have a bowel movement (BM)?"  (Normal range: 3 times a day to every 3 days)  "When was your last BM?"       Last BM Friday per patient husband  2. STRAINING: "Do you have to strain to have a BM?"      Na  3. RECTAL PAIN: "Does your rectum hurt when the stool comes out?" If Yes, ask: "Do you have hemorrhoids? How bad is the pain?"  (Scale 1-10; or mild, moderate, severe)     na 4. STOOL COMPOSITION: "Are the stools hard?"      Small amount  5. BLOOD ON STOOLS: "Has there been any blood on the toilet tissue or on the surface of the BM?" If Yes,  ask: "When was the last time?"     na 6. CHRONIC CONSTIPATION: "Is this a new problem for you?"  If No, ask: "How long have you had this problem?" (days, weeks, months)      Hx diverticulitis no BM in 3 days 7. CHANGES IN DIET OR HYDRATION: "Have there been any recent changes in your diet?" "How much fluids are you drinking on a daily basis?"  "How much have you had to drink today?"     Yes has been nauseated, taking hydrocodone  8. MEDICINES: "Have you been taking any new medicines?" "Are you taking any narcotic pain medicines?" (e.g., Dilaudid, morphine , Percocet, Vicodin)     Yes hydrocodone   9. LAXATIVES: "Have you been using any stool softeners, laxatives, or enemas?"  If Yes, ask "What, how often, and when was the last time?"     Stool softeners, miralax  in coffee this am 10. ACTIVITY:  "How much walking do you do every day?"  "Has your activity level decreased in the past week?"        Poor activity due to pain in back and not able to walk without assist 11. CAUSE: "What do you think is causing the constipation?"        Pain med 12. OTHER SYMPTOMS: "Do you have any other symptoms?" (e.g., abdomen pain, bloating, fever, vomiting)       Nausea, poor mobility  13. MEDICAL HISTORY: "Do  you have a history of hemorrhoids, rectal fissures, or rectal surgery or rectal abscess?"         na 14. PREGNANCY: "Is there any chance you are pregnant?" "When was your last menstrual period?"       na  Protocols used: Constipation-A-AH

## 2023-05-14 ENCOUNTER — Other Ambulatory Visit: Payer: Self-pay

## 2023-05-14 DIAGNOSIS — E7800 Pure hypercholesterolemia, unspecified: Secondary | ICD-10-CM

## 2023-05-14 DIAGNOSIS — E119 Type 2 diabetes mellitus without complications: Secondary | ICD-10-CM

## 2023-05-14 DIAGNOSIS — I10 Essential (primary) hypertension: Secondary | ICD-10-CM

## 2023-05-15 ENCOUNTER — Other Ambulatory Visit (INDEPENDENT_AMBULATORY_CARE_PROVIDER_SITE_OTHER): Payer: Self-pay | Admitting: Pharmacist

## 2023-05-15 DIAGNOSIS — E119 Type 2 diabetes mellitus without complications: Secondary | ICD-10-CM

## 2023-05-15 NOTE — Progress Notes (Signed)
 Pharmacy Quality Measure Review  This patient is appearing on a report for being at risk of failing the adherence measure for diabetes medications this calendar year.   Medication: Januvia  Last fill date: 02/23/23 for 30 day supply  Confirmed via claims history, patient has not refilled Januvia .  Called patient to discuss potential barriers. Unable to reach patient.   PCP follow up is scheduled for 05/19/23  Pharmacy Considerations: - If cost is an issue, consider screening for PAP. Patient may be able to receive Januvia  for free if income <400% FPL: Patient + Spouse Annual income is < ~$80,000

## 2023-05-19 ENCOUNTER — Ambulatory Visit (INDEPENDENT_AMBULATORY_CARE_PROVIDER_SITE_OTHER)

## 2023-05-19 ENCOUNTER — Ambulatory Visit: Admitting: Internal Medicine

## 2023-05-19 VITALS — BP 126/64 | HR 60 | Temp 97.6°F | Resp 16 | Ht 60.0 in | Wt 187.4 lb

## 2023-05-19 DIAGNOSIS — I10 Essential (primary) hypertension: Secondary | ICD-10-CM

## 2023-05-19 DIAGNOSIS — K8020 Calculus of gallbladder without cholecystitis without obstruction: Secondary | ICD-10-CM | POA: Diagnosis not present

## 2023-05-19 DIAGNOSIS — M47816 Spondylosis without myelopathy or radiculopathy, lumbar region: Secondary | ICD-10-CM | POA: Diagnosis not present

## 2023-05-19 DIAGNOSIS — E7800 Pure hypercholesterolemia, unspecified: Secondary | ICD-10-CM

## 2023-05-19 DIAGNOSIS — E1165 Type 2 diabetes mellitus with hyperglycemia: Secondary | ICD-10-CM | POA: Diagnosis not present

## 2023-05-19 DIAGNOSIS — M545 Low back pain, unspecified: Secondary | ICD-10-CM

## 2023-05-19 DIAGNOSIS — I700 Atherosclerosis of aorta: Secondary | ICD-10-CM | POA: Diagnosis not present

## 2023-05-19 DIAGNOSIS — G720 Drug-induced myopathy: Secondary | ICD-10-CM | POA: Diagnosis not present

## 2023-05-19 DIAGNOSIS — E039 Hypothyroidism, unspecified: Secondary | ICD-10-CM | POA: Diagnosis not present

## 2023-05-19 DIAGNOSIS — T466X5A Adverse effect of antihyperlipidemic and antiarteriosclerotic drugs, initial encounter: Secondary | ICD-10-CM

## 2023-05-19 DIAGNOSIS — R1032 Left lower quadrant pain: Secondary | ICD-10-CM

## 2023-05-19 DIAGNOSIS — Z7984 Long term (current) use of oral hypoglycemic drugs: Secondary | ICD-10-CM

## 2023-05-19 MED ORDER — TIZANIDINE HCL 2 MG PO CAPS: 2.0000 mg | ORAL_CAPSULE | Freq: Every evening | ORAL | 0 refills | Status: DC | PRN

## 2023-05-19 NOTE — Patient Instructions (Signed)
Take benefiber daily. 

## 2023-05-19 NOTE — Progress Notes (Signed)
 Subjective:    Patient ID: Veronica Branch, female    DOB: 23-Apr-1940, 83 y.o.   MRN: [id number]  Patient here for  Chief Complaint  Patient presents with   ER F/U    HPI Here for ER follow up - presented 05/09/23 - abdominal pain. Presented with left lower quadrant pain radiating to her back. CT scan - no acute findings, sigmoid diverticulosis, small paraumbilical hernia containing only mesenteric fat. Was given IVFs, morphine  and zofran . Last visit, she was started on januvia . Concerned was having side effects to metformin . She has stopped januvia  and restarted metformin . Her abdominal pain has improved. Still having low back pain. Prior to ER - no bowel movement in 4 days. After ER visit - used a suppository. Took something the following day - after had diarrhea. Was taking miralax  q am with coffee. Some urgency with her bowels. Discussed benefiber. Back pain appears to be more msk. She is using a heating pad and salon pas. Not taking the hydrocodone . Currently on metformin . Blood sugar - 115-120 in am and 160-170 in pm.    Past Medical History:  Diagnosis Date   Diabetes mellitus without complication (HCC)    Diverticulosis    GERD (gastroesophageal reflux disease)    Hiatal hernia    History of migraine headaches    Hypercholesterolemia    Hyperglycemia    Hypertension    Hypothyroidism    s/p removal of right thyroid  lobe and isthmus (1992)   Past Surgical History:  Procedure Laterality Date   BREAST EXCISIONAL BIOPSY Right 2000   benign   THYROID  LOBECTOMY  1992   s/p removal of right thyroid  lobe and isthmus   Family History  Problem Relation Age of Onset   Coronary artery disease Mother        myocardial infarction and CHF   Breast cancer Mother        81's   Breast cancer Maternal Aunt        x 2   Breast cancer Sister        8's   Breast cancer Cousin        maternal side   Breast cancer Other 59   Colon cancer Neg Hx    Social History   Socioeconomic  History   Marital status: Married    Spouse name: Not on file   Number of children: 2   Years of education: Not on file   Highest education level: Not on file  Occupational History   Occupation: retired Runner, broadcasting/film/video  Tobacco Use   Smoking status: Never    Passive exposure: Never   Smokeless tobacco: Never  Vaping Use   Vaping status: Never Used  Substance and Sexual Activity   Alcohol use: No    Alcohol/week: 0.0 standard drinks of alcohol   Drug use: No   Sexual activity: Yes  Other Topics Concern   Not on file  Social History Narrative   Regularly exercises. Retired and married.    Social Drivers of Corporate investment banker Strain: Not on file  Food Insecurity: No Food Insecurity (06/25/2022)   Hunger Vital Sign    Worried About Running Out of Food in the Last Year: Never true    Ran Out of Food in the Last Year: Never true  Transportation Needs: No Transportation Needs (06/25/2022)   PRAPARE - Administrator, Civil Service (Medical): No    Lack of Transportation (Non-Medical): No  Physical Activity: Not  on file  Stress: Not on file  Social Connections: Not on file     Review of Systems  Constitutional:  Negative for appetite change, fever and unexpected weight change.  HENT:  Negative for congestion and sinus pressure.   Respiratory:  Negative for cough, chest tightness and shortness of breath.   Cardiovascular:  Negative for chest pain, palpitations and leg swelling.  Gastrointestinal:  Negative for nausea and vomiting.       Abdominal pain - improved.   Genitourinary:  Negative for difficulty urinating and dysuria.  Musculoskeletal:  Positive for back pain. Negative for myalgias.  Skin:  Negative for color change and rash.  Neurological:  Negative for dizziness and headaches.  Psychiatric/Behavioral:  Negative for agitation and dysphoric mood.        Objective:     BP 126/64   Pulse 60   Temp 97.6 F (36.4 C)   Resp 16   Ht 5' (1.524 m)   Wt  187 lb 6.4 oz (85 kg)   SpO2 96%   BMI 36.60 kg/m  Wt Readings from Last 3 Encounters:  05/19/23 187 lb 6.4 oz (85 kg)  05/09/23 165 lb (74.8 kg)  02/23/23 187 lb 6.4 oz (85 kg)    Physical Exam Vitals reviewed.  Constitutional:      General: She is not in acute distress.    Appearance: Normal appearance.  HENT:     Head: Normocephalic and atraumatic.     Right Ear: External ear normal.     Left Ear: External ear normal.     Mouth/Throat:     Pharynx: No oropharyngeal exudate or posterior oropharyngeal erythema.  Eyes:     General: No scleral icterus.       Right eye: No discharge.        Left eye: No discharge.     Conjunctiva/sclera: Conjunctivae normal.  Neck:     Thyroid : No thyromegaly.  Cardiovascular:     Rate and Rhythm: Normal rate and regular rhythm.  Pulmonary:     Effort: No respiratory distress.     Breath sounds: Normal breath sounds. No wheezing.  Abdominal:     General: Bowel sounds are normal.     Palpations: Abdomen is soft.     Tenderness: There is no abdominal tenderness.  Musculoskeletal:        General: No swelling or tenderness.     Cervical back: Neck supple. No tenderness.     Comments: Pain with flexion/extension of back. Pain when back is in strained position.   Lymphadenopathy:     Cervical: No cervical adenopathy.  Skin:    Findings: No erythema or rash.  Neurological:     Mental Status: She is alert.  Psychiatric:        Mood and Affect: Mood normal.        Behavior: Behavior normal.         Outpatient Encounter Medications as of 05/19/2023  Medication Sig   tizanidine  (ZANAFLEX ) 2 MG capsule Take 1 capsule (2 mg total) by mouth at bedtime as needed for muscle spasms.   blood glucose meter kit and supplies KIT Dispense based on patient and insurance preference. Use to check sugars once daily. Dx E11.9   ezetimibe  (ZETIA ) 10 MG tablet Take 1 tablet (10 mg total) by mouth daily.   glucose blood (ACCU-CHEK GUIDE) test strip USE TO  TEST BLOOD SUGAR ONCE DAILY   Lancets (ONETOUCH ULTRASOFT) lancets Use to test blood sugars 1-2  times daily   E11.9   levothyroxine  (SYNTHROID ) 88 MCG tablet TAKE 1 TABLET BY MOUTH DAILY BEFORE BREAKFAST.   nystatin  cream (MYCOSTATIN ) Apply 1 Application topically 2 (two) times daily.   omeprazole  (PRILOSEC  OTC) 20 MG tablet Take 2 tablets (40 mg total) by mouth daily.   ondansetron  (ZOFRAN ) 4 MG tablet Take 1 tablet (4 mg total) by mouth every 8 (eight) hours as needed for nausea or vomiting.   polyethylene glycol (MIRALAX  / GLYCOLAX ) packet Take 17 g by mouth daily. Mix one tablespoon with 8oz of your favorite juice or water every day until you are having soft formed stools. Then start taking once daily if you didn't have a stool the day before.   propranolol  (INDERAL ) 40 MG tablet TAKE 1 TABLET BY MOUTH TWICE A DAY   telmisartan  (MICARDIS ) 20 MG tablet Take 1 tablet (20 mg total) by mouth daily.   triamcinolone  ointment (KENALOG ) 0.1 % Apply 1 Application topically as needed.   Vibegron  (GEMTESA ) 75 MG TABS Take 1 tablet (75 mg total) by mouth daily.   [DISCONTINUED] Blood Glucose Monitoring Suppl (ONE TOUCH ULTRA SYSTEM KIT) W/DEVICE KIT 1 kit by Does not apply route once.   [DISCONTINUED] sitaGLIPtin  (JANUVIA ) 100 MG tablet Take 1 tablet (100 mg total) by mouth daily.   No facility-administered encounter medications on file as of 05/19/2023.     Lab Results  Component Value Date   WBC 9.1 05/09/2023   HGB 13.7 05/09/2023   HCT 41.2 05/09/2023   PLT 273 05/09/2023   GLUCOSE 120 (H) 05/21/2023   CHOL 195 05/21/2023   TRIG 136.0 05/21/2023   HDL 49.50 05/21/2023   LDLDIRECT 156.0 12/06/2012   LDLCALC 118 (H) 05/21/2023   ALT 11 05/21/2023   AST 9 05/21/2023   NA 139 05/21/2023   K 4.1 05/21/2023   CL 106 05/21/2023   CREATININE 0.68 05/21/2023   BUN 14 05/21/2023   CO2 25 05/21/2023   TSH 4.44 02/19/2023   INR 1.1 06/22/2022   HGBA1C 7.1 (H) 05/21/2023   MICROALBUR <0.7  08/27/2022    CT ABDOMEN PELVIS W CONTRAST Result Date: 05/09/2023 CLINICAL DATA:  LLQ abdominal pain hx diverticulitis EXAM: CT ABDOMEN AND PELVIS WITH CONTRAST TECHNIQUE: Multidetector CT imaging of the abdomen and pelvis was performed using the standard protocol following bolus administration of intravenous contrast. RADIATION DOSE REDUCTION: This exam was performed according to the departmental dose-optimization program which includes automated exposure control, adjustment of the mA and/or kV according to patient size and/or use of iterative reconstruction technique. CONTRAST:  OMNIPAQUE  IOHEXOL  300 MG/ML  SOLN COMPARISON:  06/22/2022 FINDINGS: Lower chest: No pleural or pericardial effusion. Minimal linear scarring or atelectasis in the lung bases left greater than right. Hepatobiliary: No focal liver abnormality is seen. No gallstones, gallbladder wall thickening, or biliary dilatation. Pancreas: Unremarkable. No pancreatic ductal dilatation or surrounding inflammatory changes. Spleen: Normal in size without focal abnormality. Adrenals/Urinary Tract: Adrenal glands are unremarkable. Kidneys are normal, without renal calculi, focal lesion, or hydronephrosis. Bladder is unremarkable. Stomach/Bowel: Stomach is nondistended. Small bowel decompressed. Normal appendix. The colon is partially distended, with a few scattered scattered sigmoid diverticula; no adjacent inflammatory change. Vascular/Lymphatic: Minimal calcified aortic plaque without aneurysm. Portal vein patent. No abdominal or pelvic adenopathy. Reproductive: Uterus and bilateral adnexa are unremarkable. Other: Bilateral pelvic phleboliths.  No ascites.  No free air. Musculoskeletal: Small paraumbilical hernia containing only mesenteric fat. Regional bones unremarkable. IMPRESSION: 1. No acute findings. 2. Sigmoid diverticulosis. 3.  Small paraumbilical hernia containing only mesenteric fat. 4.  Aortic Atherosclerosis (ICD10-I70.0).  Electronically Signed   By: Nicoletta Barrier M.D.   On: 05/09/2023 17:58       Assessment & Plan:  Midline low back pain without sciatica, unspecified chronicity Assessment & Plan: Appears to be msk in origin. Continue tylenol . Add zanaflex . Check xray. Further w/up pending results.   Orders: -     DG Lumbar Spine 2-3 Views; Future  Left lower quadrant abdominal pain Assessment & Plan: No increased abdominal pain. CT as outlined. Discussed the need to keep her bowels moving. Benefiber as directed. Follow.    Aortic atherosclerosis (HCC) Assessment & Plan: Continue zetia .    Type 2 diabetes mellitus with hyperglycemia, without long-term current use of insulin  [facility]) Assessment & Plan: She was placed on januvia  last visit. She had wanted to hold on SGLT2 inhibitors due to her history of UTIs. Declines injections. Was placed on januvia . She stopped taking due to "intolerance". Is back on metformin . Discussed diet and exercise. Sugars as outlined. Follow.    Gallstones Assessment & Plan: Saw surgery 08/07/22 (for f/u ) - cholelithiasis without symptomatic cholelithiasis or biliary colic. Elected to hold on surgery.    Hypercholesterolemia Assessment & Plan: On zetia . Low cholesterol diet and exercise. Follow lipid panel.    Primary hypertension Assessment & Plan: Continue micardis . Follow pressures. No changes made in medication today.    Acquired hypothyroidism Assessment & Plan: On thyroid  replacement. Follow tsh.    Statin myopathy Assessment & Plan: Intolerant to statin medication.  On zetia .    Other orders -     tiZANidine  HCl; Take 1 capsule (2 mg total) by mouth at bedtime as needed for muscle spasms.  Dispense: 20 capsule; Refill: 0     Dellar Fenton, MD

## 2023-05-21 ENCOUNTER — Other Ambulatory Visit: Payer: Medicare PPO

## 2023-05-21 DIAGNOSIS — E119 Type 2 diabetes mellitus without complications: Secondary | ICD-10-CM

## 2023-05-21 DIAGNOSIS — I10 Essential (primary) hypertension: Secondary | ICD-10-CM

## 2023-05-21 DIAGNOSIS — E7800 Pure hypercholesterolemia, unspecified: Secondary | ICD-10-CM

## 2023-05-21 LAB — LIPID PANEL
Cholesterol: 195 mg/dL (ref 0–200)
HDL: 49.5 mg/dL (ref >39.00)
LDL Cholesterol: 118 mg/dL — ABNORMAL HIGH (ref 0–99)
NonHDL: 145.56
Total CHOL/HDL Ratio: 4
Triglycerides: 136 mg/dL (ref 0.0–149.0)
VLDL: 27.2 mg/dL (ref 0.0–40.0)

## 2023-05-21 LAB — HEPATIC FUNCTION PANEL
ALT: 11 U/L (ref 0–35)
AST: 9 U/L (ref 0–37)
Albumin: 4 g/dL (ref 3.5–5.2)
Alkaline Phosphatase: 71 U/L (ref 39–117)
Bilirubin, Direct: 0.1 mg/dL (ref 0.0–0.3)
Total Bilirubin: 0.5 mg/dL (ref 0.2–1.2)
Total Protein: 6.4 g/dL (ref 6.0–8.3)

## 2023-05-21 LAB — BASIC METABOLIC PANEL WITH GFR
BUN: 14 mg/dL (ref 6–23)
CO2: 25 meq/L (ref 19–32)
Calcium: 8.9 mg/dL (ref 8.4–10.5)
Chloride: 106 meq/L (ref 96–112)
Creatinine, Ser: 0.68 mg/dL (ref 0.40–1.20)
GFR: 81.59 mL/min (ref >60.00)
Glucose, Bld: 120 mg/dL — ABNORMAL HIGH (ref 70–99)
Potassium: 4.1 meq/L (ref 3.5–5.1)
Sodium: 139 meq/L (ref 135–145)

## 2023-05-21 LAB — HEMOGLOBIN A1C: Hgb A1c MFr Bld: 7.1 % — ABNORMAL HIGH (ref 4.6–6.5)

## 2023-05-24 ENCOUNTER — Encounter: Payer: Self-pay | Admitting: Internal Medicine

## 2023-05-24 NOTE — Assessment & Plan Note (Signed)
 No increased abdominal pain. CT as outlined. Discussed the need to keep her bowels moving. Benefiber as directed. Follow.

## 2023-05-24 NOTE — Assessment & Plan Note (Signed)
 Appears to be msk in origin. Continue tylenol . Add zanaflex . Check xray. Further w/up pending results.

## 2023-05-24 NOTE — Assessment & Plan Note (Signed)
On zetia.  Low cholesterol diet and exercise.  Follow lipid panel.  

## 2023-05-24 NOTE — Assessment & Plan Note (Signed)
 On thyroid replacement.  Follow tsh.

## 2023-05-24 NOTE — Assessment & Plan Note (Signed)
Saw surgery 08/07/22 (for f/u ) - cholelithiasis without symptomatic cholelithiasis or biliary colic. Elected to hold on surgery.

## 2023-05-24 NOTE — Assessment & Plan Note (Signed)
Intolerant to statin medication.  On zetia. 

## 2023-05-24 NOTE — Assessment & Plan Note (Signed)
 Continue micardis . Follow pressures. No changes made in medication today.

## 2023-05-24 NOTE — Assessment & Plan Note (Signed)
 She was placed on januvia  last visit. She had wanted to hold on SGLT2 inhibitors due to her history of UTIs. Declines injections. Was placed on januvia . She stopped taking due to "intolerance". Is back on metformin . Discussed diet and exercise. Sugars as outlined. Follow.

## 2023-05-24 NOTE — Assessment & Plan Note (Signed)
 Continue zetia.

## 2023-05-26 ENCOUNTER — Encounter: Payer: Self-pay | Admitting: Internal Medicine

## 2023-05-26 ENCOUNTER — Ambulatory Visit: Payer: Medicare PPO | Admitting: Internal Medicine

## 2023-05-26 VITALS — BP 128/70 | HR 60 | Temp 98.0°F | Resp 16 | Ht 60.0 in | Wt 184.8 lb

## 2023-05-26 DIAGNOSIS — T466X5A Adverse effect of antihyperlipidemic and antiarteriosclerotic drugs, initial encounter: Secondary | ICD-10-CM

## 2023-05-26 DIAGNOSIS — E7800 Pure hypercholesterolemia, unspecified: Secondary | ICD-10-CM

## 2023-05-26 DIAGNOSIS — I700 Atherosclerosis of aorta: Secondary | ICD-10-CM | POA: Diagnosis not present

## 2023-05-26 DIAGNOSIS — G720 Drug-induced myopathy: Secondary | ICD-10-CM

## 2023-05-26 DIAGNOSIS — E119 Type 2 diabetes mellitus without complications: Secondary | ICD-10-CM

## 2023-05-26 DIAGNOSIS — Z0000 Encounter for general adult medical examination without abnormal findings: Secondary | ICD-10-CM

## 2023-05-26 DIAGNOSIS — E1165 Type 2 diabetes mellitus with hyperglycemia: Secondary | ICD-10-CM

## 2023-05-26 DIAGNOSIS — M545 Low back pain, unspecified: Secondary | ICD-10-CM

## 2023-05-26 DIAGNOSIS — E039 Hypothyroidism, unspecified: Secondary | ICD-10-CM

## 2023-05-26 DIAGNOSIS — M25562 Pain in left knee: Secondary | ICD-10-CM | POA: Diagnosis not present

## 2023-05-26 DIAGNOSIS — Z7984 Long term (current) use of oral hypoglycemic drugs: Secondary | ICD-10-CM

## 2023-05-26 DIAGNOSIS — I10 Essential (primary) hypertension: Secondary | ICD-10-CM

## 2023-05-26 LAB — HM DIABETES FOOT EXAM

## 2023-05-26 MED ORDER — TELMISARTAN 20 MG PO TABS: 20.0000 mg | ORAL_TABLET | Freq: Every day | ORAL | 3 refills | Status: AC

## 2023-05-26 MED ORDER — METFORMIN HCL ER 500 MG PO TB24: ORAL_TABLET | ORAL | 1 refills | Status: DC

## 2023-05-26 MED ORDER — EZETIMIBE 10 MG PO TABS: 10.0000 mg | ORAL_TABLET | Freq: Every day | ORAL | 1 refills | Status: DC

## 2023-05-26 MED ORDER — LEVOTHYROXINE SODIUM 88 MCG PO TABS: 88.0000 ug | ORAL_TABLET | Freq: Every day | ORAL | 1 refills | Status: DC

## 2023-05-26 NOTE — Assessment & Plan Note (Signed)
Intolerant to statins. Continue zetia. 

## 2023-05-26 NOTE — Assessment & Plan Note (Addendum)
 Physical today 05/26/23.  Mammogram 09/02/22- Birads I.  Saw GI. Reports was told no further GI evaluation warranted.

## 2023-05-26 NOTE — Assessment & Plan Note (Signed)
 On zetia . Low cholesterol diet and exercise. Follow lipid panel.  Lab Results  Component Value Date   CHOL 195 05/21/2023   HDL 49.50 05/21/2023   LDLCALC 118 (H) 05/21/2023   LDLDIRECT 156.0 12/06/2012   TRIG 136.0 05/21/2023   CHOLHDL 4 05/21/2023

## 2023-05-26 NOTE — Assessment & Plan Note (Signed)
 She had wanted to hold on SGLT2 inhibitors due to her history of UTIs. Declines injections. Was placed on januvia . She stopped taking due to "intolerance". Is back on metformin . Discussed diet and exercise. A1c improved. Hold on changing medication. Follow met b and A1c.

## 2023-05-26 NOTE — Assessment & Plan Note (Signed)
 Continue micardis . Follow pressures. No changes made in medication today.  Follow metabolic panel.

## 2023-05-26 NOTE — Assessment & Plan Note (Signed)
 Took zanaflex . Now taking tylenol  only. Back better. Follow.

## 2023-05-26 NOTE — Progress Notes (Signed)
 Subjective:    Patient ID: Veronica Branch, female    DOB: 1940-09-21, 83 y.o.   MRN: [id number]  Patient here for  Chief Complaint  Patient presents with   Annual Exam    HPI Here for a physical exam.  Was seen in ER 05/09/23 - abdominal pain. Presented with left lower quadrant pain radiating to her back. CT scan - no acute findings, sigmoid diverticulosis, small paraumbilical hernia containing only mesenteric fat. Was given IVFs, morphine  and zofran . Was just evaluated last week for increased back pain. Xray with mild to moderate degenerative changes of the lower lumbar spine. Last visit, abdominal pain improved. Was having low back pain. Prescribed zanaflex . Took zanaflex  and tylenol  for a few days and now is only taking tylenol . Back is better. Pain resolved. No abdominal pain. Bowels moving. No vomiting. Eating. Has added fiber to her diet. Taking metformin . No chest pain. Breathing stable. Left knee better.    Past Medical History:  Diagnosis Date   Diabetes mellitus without complication (HCC)    Diverticulosis    GERD (gastroesophageal reflux disease)    Hiatal hernia    History of migraine headaches    Hypercholesterolemia    Hyperglycemia    Hypertension    Hypothyroidism    s/p removal of right thyroid  lobe and isthmus (1992)   Past Surgical History:  Procedure Laterality Date   BREAST EXCISIONAL BIOPSY Right 2000   benign   THYROID  LOBECTOMY  1992   s/p removal of right thyroid  lobe and isthmus   Family History  Problem Relation Age of Onset   Coronary artery disease Mother        myocardial infarction and CHF   Breast cancer Mother        54's   Breast cancer Maternal Aunt        x 2   Breast cancer Sister        98's   Breast cancer Cousin        maternal side   Breast cancer Other 95   Colon cancer Neg Hx    Social History   Socioeconomic History   Marital status: Married    Spouse name: Not on file   Number of children: 2   Years of education:  Not on file   Highest education level: Not on file  Occupational History   Occupation: retired Runner, broadcasting/film/video  Tobacco Use   Smoking status: Never    Passive exposure: Never   Smokeless tobacco: Never  Vaping Use   Vaping status: Never Used  Substance and Sexual Activity   Alcohol use: No    Alcohol/week: 0.0 standard drinks of alcohol   Drug use: No   Sexual activity: Yes  Other Topics Concern   Not on file  Social History Narrative   Regularly exercises. Retired and married.    Social Drivers of Corporate investment banker Strain: Not on file  Food Insecurity: No Food Insecurity (06/25/2022)   Hunger Vital Sign    Worried About Running Out of Food in the Last Year: Never true    Ran Out of Food in the Last Year: Never true  Transportation Needs: No Transportation Needs (06/25/2022)   PRAPARE - Administrator, Civil Service (Medical): No    Lack of Transportation (Non-Medical): No  Physical Activity: Not on file  Stress: Not on file  Social Connections: Not on file     Review of Systems  Constitutional:  Negative for  appetite change and unexpected weight change.  HENT:  Negative for congestion, sinus pressure and sore throat.   Eyes:  Negative for pain and visual disturbance.  Respiratory:  Negative for cough, chest tightness and shortness of breath.   Cardiovascular:  Negative for chest pain, palpitations and leg swelling.  Gastrointestinal:  Negative for abdominal pain, nausea and vomiting.  Genitourinary:  Negative for difficulty urinating and dysuria.  Musculoskeletal:  Negative for joint swelling and myalgias.  Skin:  Negative for color change and rash.  Neurological:  Negative for dizziness and headaches.  Hematological:  Negative for adenopathy. Does not bruise/bleed easily.  Psychiatric/Behavioral:  Negative for agitation and dysphoric mood.        Objective:     BP 128/70   Pulse 60   Temp 98 F (36.7 C)   Resp 16   Ht 5' (1.524 m)   Wt 184 lb  12.8 oz (83.8 kg)   SpO2 98%   BMI 36.09 kg/m  Wt Readings from Last 3 Encounters:  05/26/23 184 lb 12.8 oz (83.8 kg)  05/19/23 187 lb 6.4 oz (85 kg)  05/09/23 165 lb (74.8 kg)    Physical Exam Vitals reviewed.  Constitutional:      General: She is not in acute distress.    Appearance: Normal appearance. She is well-developed.  HENT:     Head: Normocephalic and atraumatic.     Right Ear: External ear normal.     Left Ear: External ear normal.     Mouth/Throat:     Pharynx: No oropharyngeal exudate or posterior oropharyngeal erythema.  Eyes:     General: No scleral icterus.       Right eye: No discharge.        Left eye: No discharge.     Conjunctiva/sclera: Conjunctivae normal.  Neck:     Thyroid : No thyromegaly.  Cardiovascular:     Rate and Rhythm: Normal rate and regular rhythm.  Pulmonary:     Effort: No tachypnea, accessory muscle usage or respiratory distress.     Breath sounds: Normal breath sounds. No decreased breath sounds or wheezing.  Chest:  Breasts:    Right: No inverted nipple, mass, nipple discharge or tenderness (no axillary adenopathy).     Left: No inverted nipple, mass, nipple discharge or tenderness (no axilarry adenopathy).  Abdominal:     General: Bowel sounds are normal.     Palpations: Abdomen is soft.     Tenderness: There is no abdominal tenderness.  Musculoskeletal:        General: No swelling or tenderness.     Cervical back: Neck supple.  Lymphadenopathy:     Cervical: No cervical adenopathy.  Skin:    Findings: No erythema or rash.  Neurological:     Mental Status: She is alert and oriented to person, place, and time.  Psychiatric:        Mood and Affect: Mood normal.        Behavior: Behavior normal.      Diabetic foot exam was performed with the following findings:   No deformities, ulcerations, or other skin breakdown Normal sensation of 10g monofilament Intact posterior tibialis and dorsalis pedis pulses       Outpatient Encounter Medications as of 05/26/2023  Medication Sig   metFORMIN  (GLUCOPHAGE -XR) 500 MG 24 hr tablet Take 1 tablet alternating with 2 tablets every other day.   blood glucose meter kit and supplies KIT Dispense based on patient and insurance preference. Use to check  sugars once Veronica. Dx E11.9   ezetimibe  (ZETIA ) 10 MG tablet Take 1 tablet (10 mg total) by mouth Veronica.   glucose blood (ACCU-CHEK GUIDE) test strip USE TO TEST BLOOD SUGAR ONCE Veronica   Lancets (ONETOUCH ULTRASOFT) lancets Use to test blood sugars 1-2 times Veronica   E11.9   levothyroxine  (SYNTHROID ) 88 MCG tablet Take 1 tablet (88 mcg total) by mouth Veronica before breakfast.   nystatin  cream (MYCOSTATIN ) Apply 1 Application topically 2 (two) times Veronica.   omeprazole  (PRILOSEC  OTC) 20 MG tablet Take 2 tablets (40 mg total) by mouth Veronica.   ondansetron  (ZOFRAN ) 4 MG tablet Take 1 tablet (4 mg total) by mouth every 8 (eight) hours as needed for nausea or vomiting.   polyethylene glycol (MIRALAX  / GLYCOLAX ) packet Take 17 g by mouth Veronica. Mix one tablespoon with 8oz of your favorite juice or water every day until you are having soft formed stools. Then start taking once Veronica if you didn't have a stool the day before.   propranolol  (INDERAL ) 40 MG tablet TAKE 1 TABLET BY MOUTH TWICE A DAY   telmisartan  (MICARDIS ) 20 MG tablet Take 1 tablet (20 mg total) by mouth Veronica.   tizanidine  (ZANAFLEX ) 2 MG capsule Take 1 capsule (2 mg total) by mouth at bedtime as needed for muscle spasms.   triamcinolone  ointment (KENALOG ) 0.1 % Apply 1 Application topically as needed.   Vibegron  (GEMTESA ) 75 MG TABS Take 1 tablet (75 mg total) by mouth Veronica.   [DISCONTINUED] Blood Glucose Monitoring Suppl (ONE TOUCH ULTRA SYSTEM KIT) W/DEVICE KIT 1 kit by Does not apply route once.   [DISCONTINUED] ezetimibe  (ZETIA ) 10 MG tablet Take 1 tablet (10 mg total) by mouth Veronica.   [DISCONTINUED] levothyroxine  (SYNTHROID ) 88 MCG tablet TAKE 1 TABLET BY  MOUTH Veronica BEFORE BREAKFAST.   [DISCONTINUED] telmisartan  (MICARDIS ) 20 MG tablet Take 1 tablet (20 mg total) by mouth Veronica.   No facility-administered encounter medications on file as of 05/26/2023.     Lab Results  Component Value Date   WBC 9.1 05/09/2023   HGB 13.7 05/09/2023   HCT 41.2 05/09/2023   PLT 273 05/09/2023   GLUCOSE 120 (H) 05/21/2023   CHOL 195 05/21/2023   TRIG 136.0 05/21/2023   HDL 49.50 05/21/2023   LDLDIRECT 156.0 12/06/2012   LDLCALC 118 (H) 05/21/2023   ALT 11 05/21/2023   AST 9 05/21/2023   NA 139 05/21/2023   K 4.1 05/21/2023   CL 106 05/21/2023   CREATININE 0.68 05/21/2023   BUN 14 05/21/2023   CO2 25 05/21/2023   TSH 4.44 02/19/2023   INR 1.1 06/22/2022   HGBA1C 7.1 (H) 05/21/2023   MICROALBUR <0.7 08/27/2022    CT ABDOMEN PELVIS W CONTRAST Result Date: 05/09/2023 CLINICAL DATA:  LLQ abdominal pain hx diverticulitis EXAM: CT ABDOMEN AND PELVIS WITH CONTRAST TECHNIQUE: Multidetector CT imaging of the abdomen and pelvis was performed using the standard protocol following bolus administration of intravenous contrast. RADIATION DOSE REDUCTION: This exam was performed according to the departmental dose-optimization program which includes automated exposure control, adjustment of the mA and/or kV according to patient size and/or use of iterative reconstruction technique. CONTRAST:  OMNIPAQUE  IOHEXOL  300 MG/ML  SOLN COMPARISON:  06/22/2022 FINDINGS: Lower chest: No pleural or pericardial effusion. Minimal linear scarring or atelectasis in the lung bases left greater than right. Hepatobiliary: No focal liver abnormality is seen. No gallstones, gallbladder wall thickening, or biliary dilatation. Pancreas: Unremarkable. No pancreatic ductal dilatation or surrounding  inflammatory changes. Spleen: Normal in size without focal abnormality. Adrenals/Urinary Tract: Adrenal glands are unremarkable. Kidneys are normal, without renal calculi, focal lesion, or  hydronephrosis. Bladder is unremarkable. Stomach/Bowel: Stomach is nondistended. Small bowel decompressed. Normal appendix. The colon is partially distended, with a few scattered scattered sigmoid diverticula; no adjacent inflammatory change. Vascular/Lymphatic: Minimal calcified aortic plaque without aneurysm. Portal vein patent. No abdominal or pelvic adenopathy. Reproductive: Uterus and bilateral adnexa are unremarkable. Other: Bilateral pelvic phleboliths.  No ascites.  No free air. Musculoskeletal: Small paraumbilical hernia containing only mesenteric fat. Regional bones unremarkable. IMPRESSION: 1. No acute findings. 2. Sigmoid diverticulosis. 3. Small paraumbilical hernia containing only mesenteric fat. 4.  Aortic Atherosclerosis (ICD10-I70.0). Electronically Signed   By: Nicoletta Barrier M.D.   On: 05/09/2023 17:58       Assessment & Plan:  Hypercholesterolemia Assessment & Plan: On zetia . Low cholesterol diet and exercise. Follow lipid panel.  Lab Results  Component Value Date   CHOL 195 05/21/2023   HDL 49.50 05/21/2023   LDLCALC 118 (H) 05/21/2023   LDLDIRECT 156.0 12/06/2012   TRIG 136.0 05/21/2023   CHOLHDL 4 05/21/2023     Orders: -     Hepatic function panel; Future -     Lipid panel; Future  Healthcare maintenance Assessment & Plan: Physical today 05/26/23.  Mammogram 09/02/22- Birads I.  Saw GI. Reports was told no further GI evaluation warranted.    Diabetes mellitus without complication (HCC) Assessment & Plan: Low carb diet and exercise.  Follow met b and A1c.  A1c improved. Discussed diet and exercise. Given improvement, hold on making changes in medication.  Lab Results  Component Value Date   HGBA1C 7.1 (H) 05/21/2023     Orders: -     Hemoglobin A1c; Future  Essential hypertension -     Basic metabolic panel with GFR; Future  Aortic atherosclerosis (HCC) Assessment & Plan: Continue zetia .    Type 2 diabetes mellitus with hyperglycemia, without long-term  current use of insulin  [facility]) Assessment & Plan: She had wanted to hold on SGLT2 inhibitors due to her history of UTIs. Declines injections. Was placed on januvia . She stopped taking due to "intolerance". Is back on metformin . Discussed diet and exercise. A1c improved. Hold on changing medication. Follow met b and A1c.    Primary hypertension Assessment & Plan: Continue micardis . Follow pressures. No changes made in medication today.  Follow metabolic panel.    Acquired hypothyroidism Assessment & Plan: On thyroid  replacement. Follow tsh.    Left knee pain, unspecified chronicity Assessment & Plan: Improved.    Midline low back pain without sciatica, unspecified chronicity Assessment & Plan: Took zanaflex . Now taking tylenol  only. Back better. Follow.    Statin myopathy Assessment & Plan: Intolerant to statins. Continue zetia .    Other orders -     metFORMIN  HCl ER; Take 1 tablet alternating with 2 tablets every other day.  Dispense: 135 tablet; Refill: 1 -     Ezetimibe ; Take 1 tablet (10 mg total) by mouth Veronica.  Dispense: 90 tablet; Refill: 1 -     Levothyroxine  Sodium; Take 1 tablet (88 mcg total) by mouth Veronica before breakfast.  Dispense: 90 tablet; Refill: 1 -     Telmisartan ; Take 1 tablet (20 mg total) by mouth Veronica.  Dispense: 90 tablet; Refill: 3     Dellar Fenton, MD

## 2023-05-26 NOTE — Assessment & Plan Note (Signed)
 On thyroid replacement.  Follow tsh.

## 2023-05-26 NOTE — Assessment & Plan Note (Signed)
 Low carb diet and exercise.  Follow met b and A1c.  A1c improved. Discussed diet and exercise. Given improvement, hold on making changes in medication.  Lab Results  Component Value Date   HGBA1C 7.1 (H) 05/21/2023

## 2023-05-26 NOTE — Assessment & Plan Note (Signed)
 Improved

## 2023-05-26 NOTE — Assessment & Plan Note (Signed)
 Continue zetia.

## 2023-05-28 ENCOUNTER — Encounter: Payer: Self-pay | Admitting: Pharmacist

## 2023-05-28 NOTE — Progress Notes (Signed)
 Pharmacy Quality Measure Review  This patient is appearing on a report for being at risk of failing the adherence measure for diabetes medications this calendar year.   Medication: JANUVIA  100 MG Last fill date: 02/23/2023 for 30 day supply  Patient discontinued medication due to reported side effects. No longer taking. No longer on medication list.

## 2023-06-26 ENCOUNTER — Other Ambulatory Visit: Payer: Self-pay | Admitting: Internal Medicine

## 2023-08-24 ENCOUNTER — Encounter: Payer: Self-pay | Admitting: Pharmacist

## 2023-08-24 DIAGNOSIS — E113393 Type 2 diabetes mellitus with moderate nonproliferative diabetic retinopathy without macular edema, bilateral: Secondary | ICD-10-CM | POA: Diagnosis not present

## 2023-08-24 NOTE — Progress Notes (Signed)
 Pharmacy Quality Measure Review  This patient is appearing on a report for being at risk of failing the adherence measure for diabetes medications this calendar year.   Medication: Metformin  Last fill date: 05/26/23 for 90 day supply  Insurance report was not up to date. No action needed at this time.  Medication has been refilled as of 08/22/23 x90 ds.  0 refills remaining Patient IS scheduled for f/u visit prior to next needed refill.   Future Appointments  Date Time Provider Department Center  09/28/2023 10:00 AM [facility]  10/01/2023  1:30 PM Glendia Shad, MD [facility]

## 2023-09-02 ENCOUNTER — Other Ambulatory Visit: Payer: Self-pay | Admitting: Internal Medicine

## 2023-09-02 DIAGNOSIS — Z1231 Encounter for screening mammogram for malignant neoplasm of breast: Secondary | ICD-10-CM

## 2023-09-22 ENCOUNTER — Ambulatory Visit
Admission: RE | Admit: 2023-09-22 | Discharge: 2023-09-22 | Disposition: A | Discharge status: Home / Self Care | Source: Ambulatory Visit | Attending: Internal Medicine | Admitting: Internal Medicine

## 2023-09-22 DIAGNOSIS — Z1231 Encounter for screening mammogram for malignant neoplasm of breast: Secondary | ICD-10-CM | POA: Insufficient documentation

## 2023-09-28 ENCOUNTER — Other Ambulatory Visit (INDEPENDENT_AMBULATORY_CARE_PROVIDER_SITE_OTHER)

## 2023-09-28 DIAGNOSIS — I10 Essential (primary) hypertension: Secondary | ICD-10-CM | POA: Diagnosis not present

## 2023-09-28 DIAGNOSIS — E7800 Pure hypercholesterolemia, unspecified: Secondary | ICD-10-CM | POA: Diagnosis not present

## 2023-09-28 DIAGNOSIS — E119 Type 2 diabetes mellitus without complications: Secondary | ICD-10-CM | POA: Diagnosis not present

## 2023-09-28 LAB — BASIC METABOLIC PANEL WITH GFR
BUN: 12 mg/dL (ref 6–23)
CO2: 28 meq/L (ref 19–32)
Calcium: 9.1 mg/dL (ref 8.4–10.5)
Chloride: 106 meq/L (ref 96–112)
Creatinine, Ser: 0.72 mg/dL (ref 0.40–1.20)
GFR: 78.13 mL/min (ref >60.00)
Glucose, Bld: 121 mg/dL — ABNORMAL HIGH (ref 70–99)
Potassium: 4 meq/L (ref 3.5–5.1)
Sodium: 141 meq/L (ref 135–145)

## 2023-09-28 LAB — LIPID PANEL
Cholesterol: 193 mg/dL (ref 0–200)
HDL: 49.5 mg/dL (ref >39.00)
LDL Cholesterol: 115 mg/dL — ABNORMAL HIGH (ref 0–99)
NonHDL: 143.54
Total CHOL/HDL Ratio: 4
Triglycerides: 145 mg/dL (ref 0.0–149.0)
VLDL: 29 mg/dL (ref 0.0–40.0)

## 2023-09-28 LAB — HEPATIC FUNCTION PANEL
ALT: 15 U/L (ref 0–35)
AST: 12 U/L (ref 0–37)
Albumin: 4.1 g/dL (ref 3.5–5.2)
Alkaline Phosphatase: 71 U/L (ref 39–117)
Bilirubin, Direct: 0.1 mg/dL (ref 0.0–0.3)
Total Bilirubin: 0.5 mg/dL (ref 0.2–1.2)
Total Protein: 6.7 g/dL (ref 6.0–8.3)

## 2023-09-28 LAB — HEMOGLOBIN A1C: Hgb A1c MFr Bld: 7.4 % — ABNORMAL HIGH (ref 4.6–6.5)

## 2023-09-29 ENCOUNTER — Ambulatory Visit: Payer: Self-pay | Admitting: Internal Medicine

## 2023-10-01 ENCOUNTER — Ambulatory Visit (INDEPENDENT_AMBULATORY_CARE_PROVIDER_SITE_OTHER)

## 2023-10-01 ENCOUNTER — Ambulatory Visit: Admitting: Internal Medicine

## 2023-10-01 VITALS — BP 122/78 | HR 81 | Resp 16 | Ht 60.0 in | Wt 185.2 lb

## 2023-10-01 DIAGNOSIS — G720 Drug-induced myopathy: Secondary | ICD-10-CM

## 2023-10-01 DIAGNOSIS — I700 Atherosclerosis of aorta: Secondary | ICD-10-CM | POA: Diagnosis not present

## 2023-10-01 DIAGNOSIS — Z7984 Long term (current) use of oral hypoglycemic drugs: Secondary | ICD-10-CM

## 2023-10-01 DIAGNOSIS — I10 Essential (primary) hypertension: Secondary | ICD-10-CM

## 2023-10-01 DIAGNOSIS — E669 Obesity, unspecified: Secondary | ICD-10-CM

## 2023-10-01 DIAGNOSIS — L439 Lichen planus, unspecified: Secondary | ICD-10-CM | POA: Diagnosis not present

## 2023-10-01 DIAGNOSIS — E1165 Type 2 diabetes mellitus with hyperglycemia: Secondary | ICD-10-CM | POA: Diagnosis not present

## 2023-10-01 DIAGNOSIS — E7800 Pure hypercholesterolemia, unspecified: Secondary | ICD-10-CM | POA: Diagnosis not present

## 2023-10-01 DIAGNOSIS — E559 Vitamin D deficiency, unspecified: Secondary | ICD-10-CM | POA: Diagnosis not present

## 2023-10-01 DIAGNOSIS — R053 Chronic cough: Secondary | ICD-10-CM

## 2023-10-01 DIAGNOSIS — Z8669 Personal history of other diseases of the nervous system and sense organs: Secondary | ICD-10-CM

## 2023-10-01 DIAGNOSIS — E039 Hypothyroidism, unspecified: Secondary | ICD-10-CM

## 2023-10-01 DIAGNOSIS — I517 Cardiomegaly: Secondary | ICD-10-CM | POA: Diagnosis not present

## 2023-10-01 DIAGNOSIS — R3129 Other microscopic hematuria: Secondary | ICD-10-CM

## 2023-10-01 DIAGNOSIS — R195 Other fecal abnormalities: Secondary | ICD-10-CM

## 2023-10-01 DIAGNOSIS — E119 Type 2 diabetes mellitus without complications: Secondary | ICD-10-CM

## 2023-10-01 MED ORDER — TRIAMCINOLONE ACETONIDE 0.1 % EX OINT: 1.0000 | TOPICAL_OINTMENT | CUTANEOUS | 5 refills | Status: AC | PRN

## 2023-10-01 MED ORDER — AZITHROMYCIN 250 MG PO TABS: ORAL_TABLET | ORAL | 0 refills | Status: AC

## 2023-10-01 MED ORDER — BENZONATATE 100 MG PO CAPS: 100.0000 mg | ORAL_CAPSULE | Freq: Three times a day (TID) | ORAL | 0 refills | Status: DC | PRN

## 2023-10-01 NOTE — Patient Instructions (Signed)
Restart prilosec 

## 2023-10-01 NOTE — Progress Notes (Unsigned)
 Subjective:    Patient ID: Veronica Branch, female    DOB: 31-Dec-1940, 83 y.o.   MRN: [id number]  Patient here for a scheduled follow up.   HPI Here for a scheduled follow up - follow up regarding diabetes, hypertension and hypercholesterolemia. She is currently on metformin . Had wanted to hold on SGLT2 inhibitors due to history of UTIs. Declines injections. Previous intolerance to januvia . Continues on micardis . Reviewed labs - A1c 7.4. increased from last check. Discussed elevated sugar today. Discussed treatment options. She plans to get more serious about her diet and monitoring carb intake. Continues to decline additional medication. Wants to continue metformin . Has had issues with increased dose. No chest pain reported. Does report some persistent increased cough/congestion. Occasional productive of colored mucus. Not taking prilosec . Discussed restarting.    Past Medical History:  Diagnosis Date   Diabetes mellitus without complication (HCC)    Diverticulosis    GERD (gastroesophageal reflux disease)    Hiatal hernia    History of migraine headaches    Hypercholesterolemia    Hyperglycemia    Hypertension    Hypothyroidism    s/p removal of right thyroid  lobe and isthmus (1992)   Past Surgical History:  Procedure Laterality Date   BREAST EXCISIONAL BIOPSY Right 2000   benign   THYROID  LOBECTOMY  1992   s/p removal of right thyroid  lobe and isthmus   Family History  Problem Relation Age of Onset   Coronary artery disease Mother        myocardial infarction and CHF   Breast cancer Mother        70's   Breast cancer Maternal Aunt        x 2   Breast cancer Sister        29's   Breast cancer Cousin        maternal side   Breast cancer Other 49   Colon cancer Neg Hx    Social History   Socioeconomic History   Marital status: Married    Spouse name: Not on file   Number of children: 2   Years of education: Not on file   Highest education level: Not on file   Occupational History   Occupation: retired Runner, broadcasting/film/video  Tobacco Use   Smoking status: Never    Passive exposure: Never   Smokeless tobacco: Never  Vaping Use   Vaping status: Never Used  Substance and Sexual Activity   Alcohol use: No    Alcohol/week: 0.0 standard drinks of alcohol   Drug use: No   Sexual activity: Yes  Other Topics Concern   Not on file  Social History Narrative   Regularly exercises. Retired and married.    Social Drivers of Corporate investment banker Strain: Not on file  Food Insecurity: No Food Insecurity (06/25/2022)   Hunger Vital Sign    Worried About Running Out of Food in the Last Year: Never true    Ran Out of Food in the Last Year: Never true  Transportation Needs: No Transportation Needs (06/25/2022)   PRAPARE - Administrator, Civil Service (Medical): No    Lack of Transportation (Non-Medical): No  Physical Activity: Not on file  Stress: Not on file  Social Connections: Not on file     Review of Systems  Constitutional:  Negative for appetite change and unexpected weight change.  HENT:  Positive for congestion. Negative for sinus pressure.   Respiratory:  Positive for cough. Negative for  chest tightness and shortness of breath.   Cardiovascular:  Negative for chest pain and palpitations.       Lower extremity swelling - stable.   Gastrointestinal:  Negative for abdominal pain, nausea and vomiting.  Genitourinary:  Negative for difficulty urinating and dysuria.  Musculoskeletal:  Negative for joint swelling and myalgias.  Neurological:  Negative for dizziness and headaches.       Objective:     BP 122/78   Pulse 81   Resp 16   Ht 5' (1.524 m)   Wt 185 lb 3.2 oz (84 kg)   SpO2 99%   BMI 36.17 kg/m  Wt Readings from Last 3 Encounters:  10/01/23 185 lb 3.2 oz (84 kg)  05/26/23 184 lb 12.8 oz (83.8 kg)  05/19/23 187 lb 6.4 oz (85 kg)    Physical Exam Vitals reviewed.  Constitutional:      General: She is not in acute  distress.    Appearance: Normal appearance.  HENT:     Head: Normocephalic and atraumatic.     Right Ear: External ear normal.     Left Ear: External ear normal.     Mouth/Throat:     Pharynx: No oropharyngeal exudate or posterior oropharyngeal erythema.  Eyes:     General: No scleral icterus.       Right eye: No discharge.        Left eye: No discharge.     Conjunctiva/sclera: Conjunctivae normal.  Neck:     Thyroid : No thyromegaly.  Cardiovascular:     Rate and Rhythm: Normal rate and regular rhythm.  Pulmonary:     Effort: No respiratory distress.     Breath sounds: Normal breath sounds. No wheezing.  Abdominal:     General: Bowel sounds are normal.     Palpations: Abdomen is soft.     Tenderness: There is no abdominal tenderness.  Musculoskeletal:        General: No tenderness.     Cervical back: Neck supple. No tenderness.     Comments: No increased swelling.   Lymphadenopathy:     Cervical: No cervical adenopathy.  Skin:    Findings: No erythema or rash.  Neurological:     Mental Status: She is alert.  Psychiatric:        Mood and Affect: Mood normal.        Behavior: Behavior normal.         Outpatient Encounter Medications as of 10/01/2023  Medication Sig   azithromycin  (ZITHROMAX ) 250 MG tablet Take 2 tablets on day 1, then 1 tablet daily on days 2 through 5   benzonatate  (TESSALON  PERLES) 100 MG capsule Take 1 capsule (100 mg total) by mouth 3 (three) times daily as needed for cough.   blood glucose meter kit and supplies KIT Dispense based on patient and insurance preference. Use to check sugars once daily. Dx E11.9   glucose blood (ACCU-CHEK GUIDE) test strip USE TO TEST BLOOD SUGAR ONCE DAILY   Lancets (ONETOUCH ULTRASOFT) lancets Use to test blood sugars 1-2 times daily   E11.9   levothyroxine  (SYNTHROID ) 88 MCG tablet Take 1 tablet (88 mcg total) by mouth daily before breakfast.   metFORMIN  (GLUCOPHAGE -XR) 500 MG 24 hr tablet Take 1 tablet alternating  with 2 tablets every other day.   nystatin  cream (MYCOSTATIN ) Apply 1 Application topically 2 (two) times daily.   omeprazole  (PRILOSEC  OTC) 20 MG tablet Take 2 tablets (40 mg total) by mouth daily.   polyethylene  glycol (MIRALAX  / GLYCOLAX ) packet Take 17 g by mouth daily. Mix one tablespoon with 8oz of your favorite juice or water every day until you are having soft formed stools. Then start taking once daily if you didn't have a stool the day before.   propranolol  (INDERAL ) 40 MG tablet TAKE 1 TABLET BY MOUTH TWICE A DAY   telmisartan  (MICARDIS ) 20 MG tablet Take 1 tablet (20 mg total) by mouth daily.   triamcinolone  ointment (KENALOG ) 0.1 % Apply 1 Application topically as needed.   [DISCONTINUED] Blood Glucose Monitoring Suppl (ONE TOUCH ULTRA SYSTEM KIT) W/DEVICE KIT 1 kit by Does not apply route once.   [DISCONTINUED] ezetimibe  (ZETIA ) 10 MG tablet Take 1 tablet (10 mg total) by mouth daily.   [DISCONTINUED] ondansetron  (ZOFRAN ) 4 MG tablet Take 1 tablet (4 mg total) by mouth every 8 (eight) hours as needed for nausea or vomiting.   [DISCONTINUED] tizanidine  (ZANAFLEX ) 2 MG capsule Take 1 capsule (2 mg total) by mouth at bedtime as needed for muscle spasms.   [DISCONTINUED] triamcinolone  ointment (KENALOG ) 0.1 % Apply 1 Application topically as needed.   [DISCONTINUED] Vibegron  (GEMTESA ) 75 MG TABS Take 1 tablet (75 mg total) by mouth daily.   No facility-administered encounter medications on file as of 10/01/2023.     Lab Results  Component Value Date   WBC 9.1 05/09/2023   HGB 13.7 05/09/2023   HCT 41.2 05/09/2023   PLT 273 05/09/2023   GLUCOSE 121 (H) 09/28/2023   CHOL 193 09/28/2023   TRIG 145.0 09/28/2023   HDL 49.50 09/28/2023   LDLDIRECT 156.0 12/06/2012   LDLCALC 115 (H) 09/28/2023   ALT 15 09/28/2023   AST 12 09/28/2023   NA 141 09/28/2023   K 4.0 09/28/2023   CL 106 09/28/2023   CREATININE 0.72 09/28/2023   BUN 12 09/28/2023   CO2 28 09/28/2023   TSH 4.44  02/19/2023   INR 1.1 06/22/2022   HGBA1C 7.4 (H) 09/28/2023   MICROALBUR <0.7 10/01/2023    MM 3D SCREENING MAMMOGRAM BILATERAL BREAST Result Date: 09/25/2023 CLINICAL DATA:  Screening. EXAM: DIGITAL SCREENING BILATERAL MAMMOGRAM WITH TOMOSYNTHESIS AND CAD TECHNIQUE: Bilateral screening digital craniocaudal and mediolateral oblique mammograms were obtained. Bilateral screening digital breast tomosynthesis was performed. The images were evaluated with computer-aided detection. COMPARISON:  Previous exam(s). ACR Breast Density Category b: There are scattered areas of fibroglandular density. FINDINGS: There are no findings suspicious for malignancy. IMPRESSION: No mammographic evidence of malignancy. A result letter of this screening mammogram will be mailed directly to the patient. RECOMMENDATION: Screening mammogram in one year. (Code:SM-B-01Y) BI-RADS CATEGORY  1: Negative. Electronically Signed   By: Alm Parkins M.D.   On: 09/25/2023 11:40       Assessment & Plan:  Type 2 diabetes mellitus with hyperglycemia, without long-term current use of insulin  (HCC) Assessment & Plan: Low carb diet and exercise.  Follow met b and A1c.  A1c increased - 7.4 on recent check. Discussed diet and exercise. Discussed medication options. Declines SGLT2 inhibitors and declines GLP 1 agonist. Discussed metformin . Desires to continue. Discussed increasing dose. She has had increased GI issues with increased dose. Declines additional medication. Wants to work on diet and exercise. Follow met b and A1c.  Lab Results  Component Value Date   HGBA1C 7.4 (H) 09/28/2023     Orders: -     Microalbumin / creatinine urine ratio  Lichen planus -     Triamcinolone  Acetonide; Apply 1 Application topically as needed.  Dispense: 30 g; Refill: 5  Hypercholesterolemia Assessment & Plan: On zetia . Low cholesterol diet and exercise. Follow lipid panel.  Lab Results  Component Value Date   CHOL 193 09/28/2023   HDL 49.50  09/28/2023   LDLCALC 115 (H) 09/28/2023   LDLDIRECT 156.0 12/06/2012   TRIG 145.0 09/28/2023   CHOLHDL 4 09/28/2023     Orders: -     Hepatic function panel; Future -     Lipid panel; Future  Diabetes mellitus without complication (HCC) -     Hemoglobin A1c; Future  Essential hypertension -     Basic metabolic panel with GFR; Future -     TSH; Future  Persistent cough Assessment & Plan: Cough and congestion as outlined. Persistent. Concern regarding possible bacterial infection - given worsening symptoms. Will check cxr. Treat with tessalon  perles and zapk as directed. Follow.  Call with update. Also, treat acid reflux - prilosec .   Orders: -     DG Chest 2 View; Future  Vitamin D  deficiency Assessment & Plan: Check vitamin d  level with next labs.   Orders: -     VITAMIN D  25 Hydroxy (Vit-D Deficiency, Fractures); Future  Statin myopathy Assessment & Plan: Intolerant to statins. Discussed Zetia .    Obesity with body mass index (BMI) of 30.0 to 39.9 Assessment & Plan: Discussed diet and exercise. Follow.    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 Had f/u with urology 06/2022. Recommended gemtesa   and TCC for lichen planus. Had recommended soon f/u. Does not appear followed up. Discuss need for f/u.    Loose stools Assessment & Plan: Had previous CT - no acute abnormality.  Has seen Dr Jinny and Dr Unk.  Had recommended adjusting diet (recommended avoiding artificial sweeteners and lactose free diet). IBS. Fiber. Discussed metformin . Desires to hold on other diabetic medications. Wants to continue metformin . Follow symptoms. If persistent GI issues, will need to change her diabetic medication.    Acquired hypothyroidism Assessment & Plan: On thyroid  replacement. Follow tsh.    Primary hypertension Assessment & Plan: Continue micardis . Follow pressures. No changes  made in medication today. Follow metabolic panel.    History of migraine headaches Assessment & Plan: Stable. Continue propranolol .    Aortic atherosclerosis (HCC) Assessment & Plan: Continue zetia . Intolerant to statin medication.    Other orders -     Azithromycin ; Take 2 tablets on day 1, then 1 tablet daily on days 2 through 5  Dispense: 6 tablet; Refill: 0 -     Benzonatate ; Take 1 capsule (100 mg total) by mouth 3 (three) times daily as needed for cough.  Dispense: 20 capsule; Refill: 0     Allena Hamilton, MD

## 2023-10-02 ENCOUNTER — Ambulatory Visit: Payer: Self-pay | Admitting: Internal Medicine

## 2023-10-02 ENCOUNTER — Ambulatory Visit

## 2023-10-02 ENCOUNTER — Telehealth: Payer: Self-pay

## 2023-10-02 LAB — MICROALBUMIN / CREATININE URINE RATIO
Creatinine,U: 30.4 mg/dL
Microalb Creat Ratio: UNDETERMINED mg/g (ref 0.0–30.0)
Microalb, Ur: <0.7 mg/dL

## 2023-10-02 NOTE — Telephone Encounter (Signed)
 Per Dr. Glendia pt can not get flu shot. Pt is currently on medication due to being sick. Lvm on pt's cell phone [phone] notifying her.   Cancelling pt appointment

## 2023-10-04 ENCOUNTER — Encounter: Payer: Self-pay | Admitting: Internal Medicine

## 2023-10-04 NOTE — Assessment & Plan Note (Signed)
 Had previous CT - no acute abnormality.  Has seen Dr Jinny and Dr Unk.  Had recommended adjusting diet (recommended avoiding artificial sweeteners and lactose free diet). IBS. Fiber. Discussed metformin . Desires to hold on other diabetic medications. Wants to continue metformin . Follow symptoms. If persistent GI issues, will need to change her diabetic medication.

## 2023-10-04 NOTE — Assessment & Plan Note (Signed)
 Cough and congestion as outlined. Persistent. Concern regarding possible bacterial infection - given worsening symptoms. Will check cxr. Treat with tessalon  perles and zapk as directed. Follow.  Call with update. Also, treat acid reflux - prilosec .

## 2023-10-04 NOTE — Assessment & Plan Note (Signed)
Discussed diet and exercise.  Follow.  

## 2023-10-04 NOTE — Assessment & Plan Note (Signed)
 On thyroid replacement.  Follow tsh.

## 2023-10-04 NOTE — Assessment & Plan Note (Signed)
 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 Had f/u with urology 06/2022. Recommended gemtesa   and TCC for lichen planus. Had recommended soon f/u. Does not appear followed up. Discuss need for f/u.

## 2023-10-04 NOTE — Assessment & Plan Note (Signed)
 Low carb diet and exercise.  Follow met b and A1c.  A1c increased - 7.4 on recent check. Discussed diet and exercise. Discussed medication options. Declines SGLT2 inhibitors and declines GLP 1 agonist. Discussed metformin . Desires to continue. Discussed increasing dose. She has had increased GI issues with increased dose. Declines additional medication. Wants to work on diet and exercise. Follow met b and A1c.  Lab Results  Component Value Date   HGBA1C 7.4 (H) 09/28/2023

## 2023-10-04 NOTE — Assessment & Plan Note (Signed)
 Stable  Continue propranolol

## 2023-10-04 NOTE — Assessment & Plan Note (Signed)
Check vitamin d level with next labs.  

## 2023-10-04 NOTE — Assessment & Plan Note (Signed)
 Continue micardis . Follow pressures. No changes made in medication today.  Follow metabolic panel.

## 2023-10-04 NOTE — Assessment & Plan Note (Signed)
 Continue zetia. Intolerant to statin medication.

## 2023-10-04 NOTE — Assessment & Plan Note (Signed)
 On zetia . Low cholesterol diet and exercise. Follow lipid panel.  Lab Results  Component Value Date   CHOL 193 09/28/2023   HDL 49.50 09/28/2023   LDLCALC 115 (H) 09/28/2023   LDLDIRECT 156.0 12/06/2012   TRIG 145.0 09/28/2023   CHOLHDL 4 09/28/2023

## 2023-10-04 NOTE — Assessment & Plan Note (Signed)
 Intolerant to statins. Discussed Zetia .

## 2023-10-05 ENCOUNTER — Telehealth: Payer: Self-pay

## 2023-10-05 DIAGNOSIS — L439 Lichen planus, unspecified: Secondary | ICD-10-CM

## 2023-10-05 NOTE — Addendum Note (Signed)
 Addended by: LEARTA PORTO D on: 10/05/2023 04:33 PM   Modules accepted: Orders

## 2023-10-05 NOTE — Addendum Note (Signed)
 Addended by: GLENDIA ALLENA RAMAN on: 10/05/2023 08:38 PM   Modules accepted: Orders

## 2023-10-05 NOTE — Telephone Encounter (Signed)
 Per our discussion- referral pended for you.

## 2023-10-05 NOTE — Telephone Encounter (Signed)
 Pt stated that she didn't hear from Dr. Leonce office that she was informed someone would be making an appointment for her.   Please advise

## 2023-10-05 NOTE — Telephone Encounter (Signed)
 Order has been placed for gyn referral. Please notfy pt that someone should be calling her with an appt date and time.

## 2023-10-06 NOTE — Telephone Encounter (Signed)
 Patient is aware.

## 2023-10-13 NOTE — Progress Notes (Signed)
 Veronica Branch                                          MRN: [id number]   10/13/2023   The [facility] Quality Team Specialist reviewed this patient medical record for the purposes of chart review for care gap closure. The following were reviewed: abstraction for care gap closure-kidney health evaluation for diabetes:eGFR  and uACR.    [facility] Quality Team

## 2023-10-26 DIAGNOSIS — L900 Lichen sclerosus et atrophicus: Secondary | ICD-10-CM | POA: Diagnosis not present

## 2023-10-26 DIAGNOSIS — N898 Other specified noninflammatory disorders of vagina: Secondary | ICD-10-CM | POA: Diagnosis not present

## 2023-10-26 DIAGNOSIS — L439 Lichen planus, unspecified: Secondary | ICD-10-CM | POA: Diagnosis not present

## 2023-10-26 DIAGNOSIS — L304 Erythema intertrigo: Secondary | ICD-10-CM | POA: Diagnosis not present

## 2023-11-13 ENCOUNTER — Other Ambulatory Visit: Payer: Self-pay | Admitting: Internal Medicine

## 2023-11-23 ENCOUNTER — Encounter: Payer: Self-pay | Admitting: Pharmacist

## 2023-11-23 ENCOUNTER — Other Ambulatory Visit: Payer: Self-pay

## 2023-11-23 MED ORDER — METFORMIN HCL ER 500 MG PO TB24: ORAL_TABLET | ORAL | 0 refills | Status: DC

## 2023-11-23 NOTE — Progress Notes (Signed)
 Refill sent.

## 2023-11-23 NOTE — Progress Notes (Signed)
 Pharmacy Quality Measure Review  This patient is appearing on a report for being at risk of failing the adherence measure for diabetes medications this calendar year.   Medication: metformin  XR 500 mg  Last fill date: 08/22/23 for 90 day supply 0 refills remaining  Medication refill due 11/20/23 PCP follow up scheduled 12/18 Will collaborate with provider to facilitate refill needs. [facility] clinical pool msgd.    Future Appointments  Date Time Provider Department Center  01/05/2024 10:15 AM [facility] [address]  01/07/2024  1:30 PM Glendia Shad, MD [facility] [address]

## 2024-01-05 ENCOUNTER — Other Ambulatory Visit

## 2024-01-05 DIAGNOSIS — E7800 Pure hypercholesterolemia, unspecified: Secondary | ICD-10-CM

## 2024-01-05 DIAGNOSIS — I10 Essential (primary) hypertension: Secondary | ICD-10-CM

## 2024-01-05 DIAGNOSIS — E119 Type 2 diabetes mellitus without complications: Secondary | ICD-10-CM | POA: Diagnosis not present

## 2024-01-05 DIAGNOSIS — E559 Vitamin D deficiency, unspecified: Secondary | ICD-10-CM

## 2024-01-05 LAB — BASIC METABOLIC PANEL WITH GFR
BUN: 11 mg/dL (ref 6–23)
CO2: 29 meq/L (ref 19–32)
Calcium: 9.1 mg/dL (ref 8.4–10.5)
Chloride: 105 meq/L (ref 96–112)
Creatinine, Ser: 0.7 mg/dL (ref 0.40–1.20)
GFR: 80.67 mL/min (ref >60.00)
Glucose, Bld: 126 mg/dL — ABNORMAL HIGH (ref 70–99)
Potassium: 4.3 meq/L (ref 3.5–5.1)
Sodium: 141 meq/L (ref 135–145)

## 2024-01-05 LAB — LIPID PANEL
Cholesterol: 206 mg/dL — ABNORMAL HIGH (ref 28–200)
HDL: 50.5 mg/dL (ref >39.00)
LDL Cholesterol: 133 mg/dL — ABNORMAL HIGH (ref 10–99)
NonHDL: 155.82
Total CHOL/HDL Ratio: 4
Triglycerides: 113 mg/dL (ref 10.0–149.0)
VLDL: 22.6 mg/dL (ref 0.0–40.0)

## 2024-01-05 LAB — HEPATIC FUNCTION PANEL
ALT: 16 U/L (ref 3–35)
AST: 13 U/L (ref 5–37)
Albumin: 4.1 g/dL (ref 3.5–5.2)
Alkaline Phosphatase: 75 U/L (ref 39–117)
Bilirubin, Direct: 0.1 mg/dL (ref 0.1–0.3)
Total Bilirubin: 0.6 mg/dL (ref 0.2–1.2)
Total Protein: 6.6 g/dL (ref 6.0–8.3)

## 2024-01-05 LAB — HEMOGLOBIN A1C: Hgb A1c MFr Bld: 7.1 % — ABNORMAL HIGH (ref 4.6–6.5)

## 2024-01-05 LAB — TSH: TSH: 3.46 u[IU]/mL (ref 0.35–5.50)

## 2024-01-05 LAB — VITAMIN D 25 HYDROXY (VIT D DEFICIENCY, FRACTURES): VITD: 16.41 ng/mL — ABNORMAL LOW (ref 30.00–100.00)

## 2024-01-07 ENCOUNTER — Encounter: Payer: Self-pay | Admitting: Internal Medicine

## 2024-01-07 ENCOUNTER — Ambulatory Visit: Admitting: Internal Medicine

## 2024-01-07 VITALS — BP 126/68 | HR 96 | Temp 98.2°F | Ht 60.0 in | Wt 185.8 lb

## 2024-01-07 DIAGNOSIS — E119 Type 2 diabetes mellitus without complications: Secondary | ICD-10-CM | POA: Diagnosis not present

## 2024-01-07 DIAGNOSIS — I10 Essential (primary) hypertension: Secondary | ICD-10-CM

## 2024-01-07 DIAGNOSIS — R059 Cough, unspecified: Secondary | ICD-10-CM | POA: Diagnosis not present

## 2024-01-07 DIAGNOSIS — T466X5A Adverse effect of antihyperlipidemic and antiarteriosclerotic drugs, initial encounter: Secondary | ICD-10-CM | POA: Diagnosis not present

## 2024-01-07 DIAGNOSIS — E1165 Type 2 diabetes mellitus with hyperglycemia: Secondary | ICD-10-CM | POA: Diagnosis not present

## 2024-01-07 DIAGNOSIS — E7800 Pure hypercholesterolemia, unspecified: Secondary | ICD-10-CM | POA: Diagnosis not present

## 2024-01-07 DIAGNOSIS — Z8669 Personal history of other diseases of the nervous system and sense organs: Secondary | ICD-10-CM

## 2024-01-07 DIAGNOSIS — E559 Vitamin D deficiency, unspecified: Secondary | ICD-10-CM | POA: Diagnosis not present

## 2024-01-07 DIAGNOSIS — R3129 Other microscopic hematuria: Secondary | ICD-10-CM

## 2024-01-07 DIAGNOSIS — E039 Hypothyroidism, unspecified: Secondary | ICD-10-CM

## 2024-01-07 DIAGNOSIS — Z23 Encounter for immunization: Secondary | ICD-10-CM | POA: Diagnosis not present

## 2024-01-07 DIAGNOSIS — G720 Drug-induced myopathy: Secondary | ICD-10-CM | POA: Diagnosis not present

## 2024-01-07 NOTE — Progress Notes (Signed)
 "  Subjective:    Patient ID: Veronica Branch, female    DOB: 23-Feb-1940, 83 y.o.   MRN: [id number]  Patient here for  Chief Complaint  Patient presents with   Medical Management of Chronic Issues    HPI Here for a scheduled follow up - follow up regarding diabetes, hypertension and hypercholesterolemia. She is currently on metformin . Previous intolerance to januvia . Discussed recent labs. A1c improved 7.1 on recent check. Discussed low carb diet and exercise. States am sugars <120. Will notice sugars 150-160 - highest. Also reports some sneezing and eyes watering. Some cough - notices later in day. No chest pain or sob reported. No abdominal pain or bowel change reported.    Past Medical History:  Diagnosis Date   Diabetes mellitus without complication (HCC)    Diverticulosis    GERD (gastroesophageal reflux disease)    Hiatal hernia    History of migraine headaches    Hypercholesterolemia    Hyperglycemia    Hypertension    Hypothyroidism    s/p removal of right thyroid  lobe and isthmus (1992)   Past Surgical History:  Procedure Laterality Date   BREAST EXCISIONAL BIOPSY Right 2000   benign   THYROID  LOBECTOMY  1992   s/p removal of right thyroid  lobe and isthmus   Family History  Problem Relation Age of Onset   Coronary artery disease Mother        myocardial infarction and CHF   Breast cancer Mother        78's   Breast cancer Maternal Aunt        x 2   Breast cancer Sister        68's   Breast cancer Cousin        maternal side   Breast cancer Other 92   Colon cancer Neg Hx    Social History   Socioeconomic History   Marital status: Married    Spouse name: Not on file   Number of children: 2   Years of education: Not on file   Highest education level: Not on file  Occupational History   Occupation: retired runner, broadcasting/film/video  Tobacco Use   Smoking status: Never    Passive exposure: Never   Smokeless tobacco: Never  Vaping Use   Vaping status: Never Used   Substance and Sexual Activity   Alcohol use: No    Alcohol/week: 0.0 standard drinks of alcohol   Drug use: No   Sexual activity: Yes  Other Topics Concern   Not on file  Social History Narrative   Regularly exercises. Retired and married.    Social Drivers of Health   Tobacco Use: Low Risk (01/16/2024)   Patient History    Smoking Tobacco Use: Never    Smokeless Tobacco Use: Never    Passive Exposure: Never  Financial Resource Strain: Not on file  Food Insecurity: No Food Insecurity (06/25/2022)   Hunger Vital Sign    Worried About Running Out of Food in the Last Year: Never true    Ran Out of Food in the Last Year: Never true  Transportation Needs: No Transportation Needs (06/25/2022)   PRAPARE - Administrator, Civil Service (Medical): No    Lack of Transportation (Non-Medical): No  Physical Activity: Not on file  Stress: Not on file  Social Connections: Not on file  Depression (PHQ2-9): Low Risk (10/01/2023)   Depression (PHQ2-9)    PHQ-2 Score: 0  Alcohol Screen: Not on file  Housing:  Unknown (10/26/2023)   Received from [facility]   Epic    Unable to Pay for Housing in the Last Year: Not on file    Number of Times Moved in the Last Year: Not on file    At any time in the past 12 months, were you homeless or living in a shelter (including now)?: No  Utilities: Not At Risk (06/22/2022)   [facility] Utilities    Threatened with loss of utilities: No  Health Literacy: Not on file     Review of Systems  Constitutional:  Negative for appetite change and unexpected weight change.  HENT:  Negative for sinus pressure.        Sneezing.   Eyes:        Eyes watering.   Respiratory:  Positive for cough. Negative for chest tightness and shortness of breath.        Notices cough - later in day.   Cardiovascular:  Negative for chest pain, palpitations and leg swelling.  Gastrointestinal:  Negative for abdominal pain, diarrhea, nausea and vomiting.   Genitourinary:  Negative for difficulty urinating and dysuria.  Musculoskeletal:  Negative for joint swelling and myalgias.  Skin:  Negative for color change and rash.  Neurological:  Negative for dizziness and headaches.  Psychiatric/Behavioral:  Negative for agitation and dysphoric mood.        Objective:     BP 126/68   Pulse 96   Temp 98.2 F (36.8 C) (Oral)   Ht 5' (1.524 m)   Wt 185 lb 12.8 oz (84.3 kg)   SpO2 96%   BMI 36.29 kg/m  Wt Readings from Last 3 Encounters:  01/07/24 185 lb 12.8 oz (84.3 kg)  10/01/23 185 lb 3.2 oz (84 kg)  05/26/23 184 lb 12.8 oz (83.8 kg)    Physical Exam Vitals reviewed.  Constitutional:      General: She is not in acute distress.    Appearance: Normal appearance.  HENT:     Head: Normocephalic and atraumatic.     Right Ear: External ear normal.     Left Ear: External ear normal.     Mouth/Throat:     Pharynx: No oropharyngeal exudate or posterior oropharyngeal erythema.  Eyes:     General: No scleral icterus.       Right eye: No discharge.        Left eye: No discharge.     Conjunctiva/sclera: Conjunctivae normal.  Neck:     Thyroid : No thyromegaly.  Cardiovascular:     Rate and Rhythm: Normal rate and regular rhythm.  Pulmonary:     Effort: No respiratory distress.     Breath sounds: Normal breath sounds. No wheezing.  Abdominal:     General: Bowel sounds are normal.     Palpations: Abdomen is soft.     Tenderness: There is no abdominal tenderness.  Musculoskeletal:        General: No swelling or tenderness.     Cervical back: Neck supple. No tenderness.  Lymphadenopathy:     Cervical: No cervical adenopathy.  Skin:    Findings: No erythema or rash.  Neurological:     Mental Status: She is alert.  Psychiatric:        Mood and Affect: Mood normal.        Behavior: Behavior normal.         Outpatient Encounter Medications as of 01/07/2024  Medication Sig   blood glucose meter kit and supplies KIT Dispense  based on patient and insurance preference. Use to check sugars once daily. Dx E11.9   glucose blood (ACCU-CHEK GUIDE) test strip USE TO TEST BLOOD SUGAR ONCE DAILY   Lancets (ONETOUCH ULTRASOFT) lancets Use to test blood sugars 1-2 times daily   E11.9   nystatin  cream (MYCOSTATIN ) Apply 1 Application topically 2 (two) times daily.   omeprazole  (PRILOSEC  OTC) 20 MG tablet Take 2 tablets (40 mg total) by mouth daily.   telmisartan  (MICARDIS ) 20 MG tablet Take 1 tablet (20 mg total) by mouth daily.   triamcinolone  ointment (KENALOG ) 0.1 % Apply 1 Application topically as needed.   levothyroxine  (SYNTHROID ) 88 MCG tablet Take 1 tablet (88 mcg total) by mouth daily before breakfast.   metFORMIN  (GLUCOPHAGE -XR) 500 MG 24 hr tablet Take 1 tablet alternating with 2 tablets every other day.   propranolol  (INDERAL ) 40 MG tablet Take 1 tablet (40 mg total) by mouth 2 (two) times daily.   [DISCONTINUED] benzonatate  (TESSALON  PERLES) 100 MG capsule Take 1 capsule (100 mg total) by mouth 3 (three) times daily as needed for cough. (Patient not taking: Reported on 01/07/2024)   [DISCONTINUED] Blood Glucose Monitoring Suppl (ONE TOUCH ULTRA SYSTEM KIT) W/DEVICE KIT 1 kit by Does not apply route once.   [DISCONTINUED] levothyroxine  (SYNTHROID ) 88 MCG tablet Take 1 tablet (88 mcg total) by mouth daily before breakfast.   [DISCONTINUED] metFORMIN  (GLUCOPHAGE -XR) 500 MG 24 hr tablet Take 1 tablet alternating with 2 tablets every other day.   [DISCONTINUED] polyethylene glycol (MIRALAX  / GLYCOLAX ) packet Take 17 g by mouth daily. Mix one tablespoon with 8oz of your favorite juice or water every day until you are having soft formed stools. Then start taking once daily if you didn't have a stool the day before.   [DISCONTINUED] propranolol  (INDERAL ) 40 MG tablet TAKE 1 TABLET BY MOUTH TWICE A DAY   No facility-administered encounter medications on file as of 01/07/2024.     Lab Results  Component Value Date   WBC 9.1  05/09/2023   HGB 13.7 05/09/2023   HCT 41.2 05/09/2023   PLT 273 05/09/2023   GLUCOSE 126 (H) 01/05/2024   CHOL 206 (H) 01/05/2024   TRIG 113.0 01/05/2024   HDL 50.50 01/05/2024   LDLDIRECT 156.0 12/06/2012   LDLCALC 133 (H) 01/05/2024   ALT 16 01/05/2024   AST 13 01/05/2024   NA 141 01/05/2024   K 4.3 01/05/2024   CL 105 01/05/2024   CREATININE 0.70 01/05/2024   BUN 11 01/05/2024   CO2 29 01/05/2024   TSH 3.46 01/05/2024   INR 1.1 06/22/2022   HGBA1C 7.1 (H) 01/05/2024   MICROALBUR <0.7 10/01/2023    MM 3D SCREENING MAMMOGRAM BILATERAL BREAST Result Date: 09/25/2023 CLINICAL DATA:  Screening. EXAM: DIGITAL SCREENING BILATERAL MAMMOGRAM WITH TOMOSYNTHESIS AND CAD TECHNIQUE: Bilateral screening digital craniocaudal and mediolateral oblique mammograms were obtained. Bilateral screening digital breast tomosynthesis was performed. The images were evaluated with computer-aided detection. COMPARISON:  Previous exam(s). ACR Breast Density Category b: There are scattered areas of fibroglandular density. FINDINGS: There are no findings suspicious for malignancy. IMPRESSION: No mammographic evidence of malignancy. A result letter of this screening mammogram will be mailed directly to the patient. RECOMMENDATION: Screening mammogram in one year. (Code:SM-B-01Y) BI-RADS CATEGORY  1: Negative. Electronically Signed   By: Alm Parkins M.D.   On: 09/25/2023 11:40       Assessment & Plan:  Immunization due -     Flu vaccine HIGH DOSE PF(Fluzone Trivalent)  Hypercholesterolemia Assessment &  Plan: On zetia . Low cholesterol diet and exercise. Statin intolerance. Follow lipid panel.  Lab Results  Component Value Date   CHOL 206 (H) 01/05/2024   HDL 50.50 01/05/2024   LDLCALC 133 (H) 01/05/2024   LDLDIRECT 156.0 12/06/2012   TRIG 113.0 01/05/2024   CHOLHDL 4 01/05/2024     Orders: -     Hepatic function panel; Future -     Lipid panel; Future  Diabetes mellitus without complication  (HCC) -     Hemoglobin A1c; Future  Essential hypertension -     TSH; Future  Vitamin D  deficiency Assessment & Plan: Vitamin D  level - low on recent check. Start ergocalciferol  50,000 units q week #12 with one refill. Follow vitami D level.   Orders: -     VITAMIN D  25 Hydroxy (Vit-D Deficiency, Fractures); Future  Type 2 diabetes mellitus with hyperglycemia, without long-term current use of insulin  (HCC) Assessment & Plan: Low carb diet and exercise.  Follow met b and A1c.  A1c recent check - improved - 7.1.  continues on metformin . Follow met b and A1c. Continue diet and exercise.   Lab Results  Component Value Date   HGBA1C 7.1 (H) 01/05/2024      Statin myopathy Assessment & Plan: Intolerant to statins. Discussed Zetia . Low cholesterol diet and exercise.    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 Had f/u with urology 06/2022. Recommended gemtesa   and TCC for lichen planus. Had recommended soon f/u. Does not appear followed up. Needs f/u with urology.    Acquired hypothyroidism Assessment & Plan: On thyroid  replacement. Follow tsh.    Primary hypertension Assessment & Plan: Continue micardis . Follow pressures. No changes made in medication today. Follow metabolic panel.    Cough, unspecified type Assessment & Plan: Sneezing, eyes waterin as outlined. Cough - notices symptoms later in the day. Discussed claritin. Also discussed prilosec  daily. Follow. Call with update.    History of migraine headaches Assessment & Plan: Stable. Continue propranolol .    Other orders -     Levothyroxine  Sodium; Take 1 tablet (88 mcg total) by mouth daily before breakfast.  Dispense: 90 tablet; Refill: 1 -     metFORMIN  HCl ER; Take 1 tablet alternating with 2 tablets every other day.  Dispense: 135 tablet; Refill: 0 -     Propranolol  HCl; Take 1 tablet (40 mg total) by mouth 2 (two)  times daily.  Dispense: 180 tablet; Refill: 1     Allena Hamilton, MD "

## 2024-01-07 NOTE — Patient Instructions (Signed)
 Take prilosec  daily  Take claritin daily

## 2024-01-11 ENCOUNTER — Telehealth: Payer: Self-pay

## 2024-01-11 MED ORDER — VITAMIN D (ERGOCALCIFEROL) 1.25 MG (50000 UNIT) PO CAPS: 50000.0000 [IU] | ORAL_CAPSULE | ORAL | 1 refills | Status: AC

## 2024-01-11 NOTE — Telephone Encounter (Signed)
 Copied from CRM #[id number]. Topic: Clinical - Prescription Issue >> Jan 11, 2024 10:39 AM Veronica Branch wrote: Reason for CRM: Patient states that PCP informed her that she was going to call in a prescription for vitamin D  for her on 12/18, but when she went to the pharmacy on 12/19, it was not there. She would like a status update on this. I did not see this listed on her med list, but do see it was discussed at last appt.  Medication: Prescription strength Vitamin D  (Not on med list)  Pharmacy: [facility] [address] [place] - [address] [place] [address] Phone: [phone] Fax: [phone] Hours: Not open 24 hours

## 2024-01-11 NOTE — Addendum Note (Signed)
 Addended by: GLENDIA ALLENA RAMAN on: 01/11/2024 02:31 PM   Modules accepted: Orders

## 2024-01-11 NOTE — Telephone Encounter (Signed)
 Please call and let her know that Rx sent in for prescription vitamin D  to take weekly.

## 2024-01-12 NOTE — Telephone Encounter (Signed)
 Lvm okay to relay message

## 2024-01-13 NOTE — Telephone Encounter (Signed)
 Pt notified. Pt has picked up

## 2024-01-16 ENCOUNTER — Other Ambulatory Visit: Payer: Self-pay | Admitting: Internal Medicine

## 2024-01-16 ENCOUNTER — Encounter: Payer: Self-pay | Admitting: Internal Medicine

## 2024-01-16 MED ORDER — PROPRANOLOL HCL 40 MG PO TABS: 40.0000 mg | ORAL_TABLET | Freq: Two times a day (BID) | ORAL | 1 refills | Status: AC

## 2024-01-16 MED ORDER — LEVOTHYROXINE SODIUM 88 MCG PO TABS: 88.0000 ug | ORAL_TABLET | Freq: Every day | ORAL | 1 refills | Status: AC

## 2024-01-16 MED ORDER — METFORMIN HCL ER 500 MG PO TB24: ORAL_TABLET | ORAL | 0 refills | Status: AC

## 2024-01-16 NOTE — Assessment & Plan Note (Signed)
 Sneezing, eyes waterin as outlined. Cough - notices symptoms later in the day. Discussed claritin. Also discussed prilosec  daily. Follow. Call with update.

## 2024-01-16 NOTE — Assessment & Plan Note (Signed)
 On thyroid replacement.  Follow tsh.

## 2024-01-16 NOTE — Assessment & Plan Note (Signed)
 Stable  Continue propranolol

## 2024-01-16 NOTE — Assessment & Plan Note (Signed)
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 Had f/u with urology 06/2022. Recommended gemtesa   and TCC for lichen planus. Had recommended soon f/u. Does not appear followed up. Needs f/u with urology.  "

## 2024-01-16 NOTE — Assessment & Plan Note (Signed)
 Intolerant to statins. Discussed Zetia . Low cholesterol diet and exercise.

## 2024-01-16 NOTE — Assessment & Plan Note (Signed)
 Continue micardis . Follow pressures. No changes made in medication today.  Follow metabolic panel.

## 2024-01-16 NOTE — Assessment & Plan Note (Signed)
 Low carb diet and exercise.  Follow met b and A1c.  A1c recent check - improved - 7.1.  continues on metformin . Follow met b and A1c. Continue diet and exercise.   Lab Results  Component Value Date   HGBA1C 7.1 (H) 01/05/2024

## 2024-01-16 NOTE — Assessment & Plan Note (Signed)
 On zetia . Low cholesterol diet and exercise. Statin intolerance. Follow lipid panel.  Lab Results  Component Value Date   CHOL 206 (H) 01/05/2024   HDL 50.50 01/05/2024   LDLCALC 133 (H) 01/05/2024   LDLDIRECT 156.0 12/06/2012   TRIG 113.0 01/05/2024   CHOLHDL 4 01/05/2024

## 2024-01-16 NOTE — Assessment & Plan Note (Signed)
 Vitamin D  level - low on recent check. Start ergocalciferol  50,000 units q week #12 with one refill. Follow vitami D level.

## 2024-03-03 ENCOUNTER — Ambulatory Visit: Admitting: Internal Medicine
